# Patient Record
Sex: Female | Born: 1955 | Race: Black or African American | Hispanic: No | Marital: Single | Smoking: Never smoker
Health system: Southern US, Community
[De-identification: ages and names within clinical notes are randomized; demographics above are authoritative.]

## PROBLEM LIST (undated history)

## (undated) DIAGNOSIS — D509 Iron deficiency anemia, unspecified: Secondary | ICD-10-CM

## (undated) DIAGNOSIS — Z9289 Personal history of other medical treatment: Secondary | ICD-10-CM

## (undated) DIAGNOSIS — E785 Hyperlipidemia, unspecified: Secondary | ICD-10-CM

## (undated) DIAGNOSIS — E876 Hypokalemia: Secondary | ICD-10-CM

## (undated) DIAGNOSIS — C801 Malignant (primary) neoplasm, unspecified: Secondary | ICD-10-CM

## (undated) DIAGNOSIS — Z8673 Personal history of transient ischemic attack (TIA), and cerebral infarction without residual deficits: Secondary | ICD-10-CM

## (undated) DIAGNOSIS — R531 Weakness: Secondary | ICD-10-CM

## (undated) DIAGNOSIS — R601 Generalized edema: Secondary | ICD-10-CM

## (undated) DIAGNOSIS — E119 Type 2 diabetes mellitus without complications: Secondary | ICD-10-CM

## (undated) DIAGNOSIS — G4700 Insomnia, unspecified: Secondary | ICD-10-CM

## (undated) DIAGNOSIS — C799 Secondary malignant neoplasm of unspecified site: Secondary | ICD-10-CM

## (undated) DIAGNOSIS — I639 Cerebral infarction, unspecified: Secondary | ICD-10-CM

## (undated) DIAGNOSIS — I10 Essential (primary) hypertension: Secondary | ICD-10-CM

## (undated) HISTORY — DX: Insomnia, unspecified: G47.00

## (undated) HISTORY — DX: Malignant (primary) neoplasm, unspecified: C80.1

## (undated) HISTORY — DX: Iron deficiency anemia, unspecified: D50.9

## (undated) HISTORY — DX: Personal history of transient ischemic attack (TIA), and cerebral infarction without residual deficits: Z86.73

## (undated) HISTORY — DX: Weakness: R53.1

---

## 2012-06-07 ENCOUNTER — Inpatient Hospital Stay: Payer: Self-pay | Admitting: Internal Medicine

## 2012-06-07 DIAGNOSIS — I369 Nonrheumatic tricuspid valve disorder, unspecified: Secondary | ICD-10-CM

## 2012-06-07 LAB — COMPREHENSIVE METABOLIC PANEL
Albumin: 3.7 g/dL (ref 3.4–5.0)
Bilirubin,Total: 0.4 mg/dL (ref 0.2–1.0)
Calcium, Total: 9.1 mg/dL (ref 8.5–10.1)
Chloride: 104 mmol/L (ref 98–107)
Co2: 25 mmol/L (ref 21–32)
Creatinine: 0.86 mg/dL (ref 0.60–1.30)
EGFR (Non-African Amer.): >60
Glucose: 94 mg/dL (ref 65–99)

## 2012-06-07 LAB — PROTIME-INR
INR: 0.9
Prothrombin Time: 12.6 secs (ref 11.5–14.7)

## 2012-06-07 LAB — CBC
HGB: 13.2 g/dL (ref 12.0–16.0)
MCH: 23.2 pg — ABNORMAL LOW (ref 26.0–34.0)
MCHC: 30.9 g/dL — ABNORMAL LOW (ref 32.0–36.0)
MCV: 75 fL — ABNORMAL LOW (ref 80–100)
WBC: 6.3 10*3/uL (ref 3.6–11.0)

## 2012-06-07 LAB — URINALYSIS, COMPLETE
Bilirubin,UR: NEGATIVE
Leukocyte Esterase: NEGATIVE
Nitrite: NEGATIVE
Protein: 100
Squamous Epithelial: 4

## 2012-06-07 LAB — LIPID PANEL
Cholesterol: 194 mg/dL (ref 0–200)
HDL Cholesterol: 87 mg/dL — ABNORMAL HIGH (ref 40–60)
Triglycerides: 67 mg/dL (ref 0–200)

## 2012-06-07 LAB — HEMOGLOBIN A1C: Hemoglobin A1C: 5.1 % (ref 4.2–6.3)

## 2012-06-08 ENCOUNTER — Ambulatory Visit: Payer: Self-pay | Admitting: Neurology

## 2012-06-08 LAB — BASIC METABOLIC PANEL
Anion Gap: 9 (ref 7–16)
Creatinine: 0.92 mg/dL (ref 0.60–1.30)
EGFR (African American): >60
EGFR (Non-African Amer.): >60
Sodium: 140 mmol/L (ref 136–145)

## 2012-06-08 LAB — CBC WITH DIFFERENTIAL/PLATELET
Basophil %: 0.4 %
Eosinophil #: 0.1 10*3/uL (ref 0.0–0.7)
Eosinophil %: 1.1 %
Lymphocyte #: 1 10*3/uL (ref 1.0–3.6)
Lymphocyte %: 18 %
MCH: 23.4 pg — ABNORMAL LOW (ref 26.0–34.0)
MCV: 75 fL — ABNORMAL LOW (ref 80–100)
Monocyte #: 0.7 x10 3/mm (ref 0.2–0.9)
Monocyte %: 11.5 %
Platelet: 237 10*3/uL (ref 150–440)
RBC: 5.34 10*6/uL — ABNORMAL HIGH (ref 3.80–5.20)
RDW: 16.7 % — ABNORMAL HIGH (ref 11.5–14.5)
WBC: 5.7 10*3/uL (ref 3.6–11.0)

## 2012-06-08 LAB — MAGNESIUM: Magnesium: 2 mg/dL

## 2012-06-10 LAB — BASIC METABOLIC PANEL
Anion Gap: 8 (ref 7–16)
BUN: 11 mg/dL (ref 7–18)
Calcium, Total: 9.3 mg/dL (ref 8.5–10.1)
Chloride: 106 mmol/L (ref 98–107)
Co2: 26 mmol/L (ref 21–32)
EGFR (African American): >60
Glucose: 85 mg/dL (ref 65–99)
Potassium: 3.6 mmol/L (ref 3.5–5.1)
Sodium: 140 mmol/L (ref 136–145)

## 2014-06-03 ENCOUNTER — Emergency Department: Payer: Self-pay | Admitting: Emergency Medicine

## 2014-06-03 LAB — BASIC METABOLIC PANEL
ANION GAP: 9 (ref 7–16)
BUN: 24 mg/dL — ABNORMAL HIGH (ref 7–18)
CALCIUM: 8.7 mg/dL (ref 8.5–10.1)
CO2: 28 mmol/L (ref 21–32)
Chloride: 105 mmol/L (ref 98–107)
Creatinine: 1.87 mg/dL — ABNORMAL HIGH (ref 0.60–1.30)
EGFR (African American): 34 — ABNORMAL LOW
EGFR (Non-African Amer.): 29 — ABNORMAL LOW
Glucose: 85 mg/dL (ref 65–99)
Osmolality: 286 (ref 275–301)
Potassium: 3.7 mmol/L (ref 3.5–5.1)
Sodium: 142 mmol/L (ref 136–145)

## 2014-06-03 LAB — CBC
HCT: 37 % (ref 35.0–47.0)
HGB: 11.7 g/dL — AB (ref 12.0–16.0)
MCH: 23.4 pg — ABNORMAL LOW (ref 26.0–34.0)
MCHC: 31.6 g/dL — AB (ref 32.0–36.0)
MCV: 74 fL — AB (ref 80–100)
Platelet: 219 10*3/uL (ref 150–440)
RBC: 4.98 10*6/uL (ref 3.80–5.20)
RDW: 14.6 % — ABNORMAL HIGH (ref 11.5–14.5)
WBC: 6.2 10*3/uL (ref 3.6–11.0)

## 2014-06-03 LAB — TROPONIN I
Troponin-I: 0.03 ng/mL
Troponin-I: 0.04 ng/mL

## 2015-04-11 NOTE — Consult Note (Signed)
Referring Physician:  Gladstone Lighter :   Reason for Consult:  Admit Date: 07-Jun-2012   Chief Complaint: left-sided weakness   Reason for Consult: CVA   History of Present Illness:  History of Present Illness:   Renee Wells is seen in consultation at the request of Dr. Tressia Miners for evaluation of stroke. Renee Wells is a 59 year-old right-handed female without significant past medical history (does not regularly follow-up with a PCP) who was hospitalized at [facility] on 06/07/2012 for stroke. She reports that her symptoms began on 06/04/2012, when she experienced a transient burning sensation along the right side of her face for a few minutes, which then resolved spontaneously. On 06/05/2012, she experienced acute-onset weakness of her left leg, stating that her left leg suddenly "gave out," causing her to fall. When she got back up, her left leg weakness had resolved, and the patient attributed her fall to a misstep. On 06/06/2012, the patient reports that she was feeling "funny" throughout the whole day and experienced transient burning sensation on the right side of her face and weakness of her left arm and left leg. After taking a nap and waking up later that evening, she felt better, as the facial nurning sensation and left-sided weakness had completely resolved. However, after the patient woke up on 06/07/2012, she noticed that her left arm and left leg weakness had returned. She was able to lift her left arm and left leg against gravity, but otherwise had significant difficulty with walking and with grasping objects. She denies any sensory deficits. No visual complaints or facial droop. She does feel that her speech seems a little bit slurred. Due to concern for stroke, she presented to the [facility] ED. Her BP in the ED was noted to be elevated at 227/113. This morning, the patient complains of muscle spasms on her left side. She also feels weaker this morning on her left  side, as she can barely raise her left arm and left leg against gravity.  ROS:   General weight gain of 11 lbs over past 1.5 years    HEENT no complaints    Lungs no complaints    Cardiac no complaints    GI no complaints    GU no complaints    Musculoskeletal muscle spasms on left side    Extremities no complaints    Skin no complaints    Neuro had headache yesterday, resolved this morning    Endocrine no complaints    Psych has been under some stress due to the poor health of her brother   Past Medical/Surgical Hx:  denies:   Past Medical/ Surgical Hx:   Past Medical History None    Past Surgical History C-section   Home Medications:  Additional Home Medications: takes a natural vitamin, but no other medications   [facility] Current Meds:  Sodium Chloride 0.9%, 1000 ml at 60 ml/hr  Acetaminophen * tablet, ( Tylenol (325 mg) tablet)  650 mg Oral q4h PRN for pain or temp. greater than 100.4  - Indication: Pain/Fever  Enoxaparin injection, ( Lovenox injection )  40 mg, Subcutaneous, daily  Indication: Prophylaxis or treatment of thromboembolic disorders, Monitor Anticoags per hospital protocol  Ondansetron injection, ( Zofran injection )  4 mg, IV push, q4h PRN for Nausea/Vomiting  Indication: Nausea/ Vomiting  Pantoprazole tablet, 40 mg Oral q6am  - Indication: Erosive Esophagitis/ GERD  Instructions:  DO NOT CRUSH  Aspirin Enteric Coated tablet, ( Ecotrin)  325 mg  Oral daily  - Indication: Pain/Fever/Thromboembolic Disorders/Post MI/Prophylaxis MI  Instructions:  Initiate Bleeding Precautions Protocol--DO NOT CRUSH  Metoprolol tartrate tablet, ( Lopressor)  25 mg Oral bid  - Indication: Antihypertensive/ Angina  hydrALAZINE HCl injection, ( Apresoline injection )  10 mg, IV push, q4h PRN for HTN  Indication: Hypertension/ CHF, for SBP> 165  Simvastatin tablet, ( Zocor)  20 mg Oral at bedtime  - Indication: Hypercholesterolemia  hydrALAZINE HCl tablet,  ( Apresoline)  25 mg Oral tid  - Indication: Hypertension/ CHF  cloNIDine  tablet, ( Catapres)  0.2 mg Oral bid  - Indication: Hypertension/ Withdrawal Symptoms  hydrochlorothiazide-lisinopril 12.5/10 mg tablet,  ( Lisinopril 10-HCTZ 12.20m tablet)  1 tablet(s) Oral daily  - Indication: Hypertension/ CHF  Cyclobenzaprine tablet,  ( Flexeril)  10 mg Oral q8h PRN for myscle spasm  - Indication: Muscle Relaxant/ Muscle Spasm  Allergies:  Penicillin: Hives  Social/Family History:  Social History: Lives with her daughter and grandchildren. Currently unemployed. Can perform all ADLs at baseline. Denies tobacco use. Occasionally drinks beer on weekends. Denies illicit drug use.   Family History: Uncle had a stroke. Mother had enlarged heart and hypertension.   Vital Signs: **Vital Signs.:   22-Jun-13 00:10   Vital Signs Type Recheck   Pulse Pulse 76   Systolic BP Systolic BP 1325  Diastolic BP (mmHg) Diastolic BP (mmHg) 84   Mean BP 105    04:22   Vital Signs Type Routine   Temperature Temperature (F) 98.4   Celsius 36.8   Temperature Source Oral   Pulse Pulse 83   Respirations Respirations 16   Systolic BP Systolic BP 1498  Diastolic BP (mmHg) Diastolic BP (mmHg) 99   Mean BP 124   Pulse Ox % Pulse Ox % 99   Pulse Ox Activity Level  At rest   Oxygen Delivery Room Air/ 21 %    05:23   Vital Signs Type Recheck   Systolic BP Systolic BP 1264  Diastolic BP (mmHg) Diastolic BP (mmHg) 95   Mean BP 120   Physical Exam:  General: No acute distress   HEENT: Moist mucous membranes   Neck: Supple, no carotid bruits   Chest: Clear to auscultation bilaterally   Cardiac: RRR, no murmurs/rubs/gallops   Extremities: No edema   Neurologic Exam:  Mental Status: Awake and alert, oriented to self, place, and time. Attention and concentration intact. Speech is fluent, without dysarthria or aphasia. Naming and repetition intact.   Cranial Nerves: Visual fields intact. PERRL. Extra-ocular  movements intact. Facial sensation intact to light touch and pinprick. Face appears grossly symmetrical, though may have minimally delayed activation of left lower face on smile. Palate elevates symmetrically. Full shoulder shrug bilaterally. Tongue protrudes midline.   Motor Exam: Decreased motor tone in left arm and left leg. No pronator drift in right arm, unable to raise left arm up to test pronator drift.  Left upper extremity: 3/5 deltoid, 2/5 tricep, 2/5 bicep, 3/5 wrist extensor, 3/5 wrist flexor, 1/5 hand grip, 0/5 interossei  Right upper extremity: 5/5 deltoid, 5/5 tricep, 5/5 bicep, 5/5 wrist extensor, 5/5 wrist flexor, 5/5 hand grip, 5/5 interossei  Left lower extremity: 3/5 hip flexion, 0/5 knee flexion, 2/5 knee extension, 3/5 foot dorsiflexion, 3/5 foot plantarflexion  Right lower extremity: 5/5 hip flexion, 5/5 knee flexion, 5/5 knee extension, 5/5 foot dorsiflexion, 5/5 foot plantarflexion   Deep Tendon Reflexes: 1+ in bilateral biceps, brachioradialis; 1+ on right patella, 2+ on left patella,  trace achilles. Toes downgoing on right, equivocal on left.   Sensory Exam: Intact to light touch and pinprick in all exteemities. No extinction to double stimulation.   Coordination: Intact finger-to-nose and heel-to-shin on right, unable to test on left due to weakness.   Cerebellar Exam: No truncal ataxia.   Gait: Unable to assess due to left lower extremity weakness   Lab Results: Hepatic:  21-Jun-13 07:58    Bilirubin, Total 0.4   Alkaline Phosphatase 87   SGPT (ALT) 21 (12-78 NOTE: NEW REFERENCE RANGE 11/10/2011)   SGOT (AST) 31   Total Protein, Serum 8.1   Albumin, Serum 3.7  Routine Chem:  21-Jun-13 07:50    Hemoglobin A1c ([facility]) 5.1 (The American Diabetes Association recommends that a primary goal of therapy should be <7% and that physicians should reevaluate the treatment regimen in patients with HbA1c values consistently >8%.)   Cholesterol, Serum 194   Triglycerides,  Serum 67   HDL (INHOUSE)  87   VLDL Cholesterol Calculated 13   LDL Cholesterol Calculated 94 (Result(s) reported on 07 Jun 2012 at 09:30AM.)  22-Jun-13 05:50    Glucose, Serum  100   BUN 12   Creatinine (comp) 0.92   Sodium, Serum 140   Potassium, Serum  3.4   Chloride, Serum 107   CO2, Serum 24   Calcium (Total), Serum 9.0   Anion Gap 9   Osmolality (calc) 279   eGFR (African American) >60   eGFR (Non-African American) >60 (eGFR values <47m/min/1.73 m2 may be an indication of chronic kidney disease (CKD). Calculated eGFR is useful in patients with stable renal function. The eGFR calculation will not be reliable in acutely ill patients when serum creatinine is changing rapidly. It is not useful in  patients on dialysis. The eGFR calculation may not be applicable to patients at the low and high extremes of body sizes, pregnant women, and vegetarians.)   Magnesium, Serum 2.0 (1.8-2.4 THERAPEUTIC RANGE: 4-7 mg/dL TOXIC: > 10 mg/dL  -----------------------)  Routine UA:  21-Jun-13 08:43    Color (UA) Yellow   Clarity (UA) Hazy   Glucose (UA) Negative   Bilirubin (UA) Negative   Ketones (UA) Trace   Specific Gravity (UA) 1.013   Blood (UA) 1+   pH (UA) 6.0   Protein (UA) 100 mg/dL   Nitrite (UA) Negative   Leukocyte Esterase (UA) Negative (Result(s) reported on 07 Jun 2012 at 09:16AM.)   RBC (UA) 1 /HPF   WBC (UA) 1 /HPF   Bacteria (UA) TRACE   Epithelial Cells (UA) 4 /HPF   Mucous (UA) PRESENT   Amorphous Crystal (UA) PRESENT (Result(s) reported on 07 Jun 2012 at 09:16AM.)  Routine Coag:  21-Jun-13 07:58    Prothrombin 12.6   INR 0.9 (INR reference interval applies to patients on anticoagulant therapy. A single INR therapeutic range for coumarins is not optimal for all indications; however, the suggested range for most indications is 2.0 - 3.0. Exceptions to the INR Reference Range may include: Prosthetic heart valves, acute myocardial infarction, prevention of  myocardial infarction, and combinations of aspirin and anticoagulant. The need for a higher or lower target INR must be assessed individually. Reference: The Pharmacology and Management of the Vitamin K  antagonists: the seventh ACCP Conference on Antithrombotic and Thrombolytic Therapy. CWPYKD.9833Sept:126 (3suppl): [id number] A HCT value >55% may artifactually increase the PT.  In one study,  the increase was an average of 25%. Reference:  "Effect on Routine and Special Coagulation Testing Values  of Citrate Anticoagulant Adjustment in Patients with High HCT Values." American Journal of Clinical Pathology 2006;126:400-405.)   Activated PTT (APTT) 27.7 (A HCT value >55% may artifactually increase the APTT. In one study, the increase was an average of 19%. Reference: "Effect on Routine and Special Coagulation Testing Values of Citrate Anticoagulant Adjustment in Patients with High HCT Values." American Journal of Clinical Pathology 2006;126:400-405.)  Routine Hem:  22-Jun-13 05:50    WBC (CBC) 5.7   RBC (CBC)  5.34   Hemoglobin (CBC) 12.5   Hematocrit (CBC) 40.2   Platelet Count (CBC) 237   MCV  75   MCH  23.4   MCHC  31.2   RDW  16.7   Neutrophil % 69.0   Lymphocyte % 18.0   Monocyte % 11.5   Eosinophil % 1.1   Basophil % 0.4   Neutrophil # 4.0   Lymphocyte # 1.0   Monocyte # 0.7   Eosinophil # 0.1   Basophil # 0.0 (Result(s) reported on 08 Jun 2012 at 06:39AM.)   Radiology Results: [place]:    21-Jun-13 10:18, US Carotid Doppler Bilateral   US Carotid Doppler Bilateral    REASON FOR EXAM:    CAV with left sided weakness  COMMENTS:       PROCEDURE: [place]  - US CAROTID DOPPLER BILATERAL  - Jun 07 2012 10:18AM     RESULT: There is observed very slight intimal thickening bilaterally. No   calcific plaque formation is seen oneither side. On the right, the peak   right common carotid artery flow velocity measures 71 cm/second and the   peak right internal carotid artery flow  velocity measures 82.4 cm/second.   The ICA/CCA ratio is 1.16.     On the left, the peak left common carotid artery flow velocity measures   85.9 cm/second and the peak left internal carotid artery flow velocity   measures 79.8 cm/second. The ICA/CCA ratio is 0.93. These values   bilaterally are in the normal range and are consistent with the bilateral     absence of hemodynamically significant stenosis.    There is observed antegrade flow in both vertebrals.    IMPRESSION:   1. No hemodynamically significant stenosis is identified on either side.  2. There is antegrade flow in both vertebrals.    Thank you for the opportunity to contribute to the care of your patient.     Dictation site: 2           Verified By: Dionne Ano WALL, M.D., MD  MRI:    21-Jun-13 15:34, MRI Brain Without Contrast   MRI Brain Without Contrast    REASON FOR EXAM:    left sided weakness  COMMENTS:       PROCEDURE: MR  - MR BRAIN WO CONTRAST  - Jun 07 2012  3:34PM     RESULT: History: Weakness    Technique: Multiplanar, multisequence MRI of the brain was obtained   without IV contrast.     Comparison:  None    Findings:     There is restricted diffusion in the right pons with corresponding ADC     hypointensity consistent with an acute infarct. There is no hemorrhage.   There is no pathologic extra-axial fluid collection. There is generalized   cerebral atrophy. There is periventricular and deep white matter T2 and   FLAIR hyperintensity likely secondary to microangiopathy. There is no   hydrocephalus. The ventricles are normal for age. The basal cisterns are  patent. There are no abnormal extra-axial fluid collections.     There is bilateral mild maxillary sinus mucosal thickening. The skull   base and calvarium demonstrate normal signal. The major intracranial flow   voids, including dural venous sinuses appear patent.    IMPRESSION:     Acute nonhemorrhagic right pontine  infarct.    Dictation Site: 1          Verified By: Jennette Banker, M.D., MD  CT:    21-Jun-13 08:06, CT Head Without Contrast   CT Head Without Contrast    REASON FOR EXAM:    Stroke-Like Symptoms- Focal Neuro Deficit  COMMENTS:       PROCEDURE: CT  - CT HEAD WITHOUT CONTRAST  - Jun 07 2012  8:06AM     RESULT: History: A stroke.    Findings: Standard nonenhanced CT is obtained. Lucency is noted adjacent  to the caudate nucleus on the right. This patient in anterior limb the   right internal capsule. This could represent a subacute stroke and White   matter changes noted consistent with chronic ischemia. No mass lesion or   hemorrhage.    IMPRESSION:  Lucency noted anteriorly in the right internal capsule most   likely subacute stroke. No hemorrhage. No bony abnormality.    Verified By: Osa Craver, M.D., MD   Impression/Recommendations:  Recommendations:   59 year-old right-handed female without significant past medical history (due to poor follow-up with PCP) now with right pontine stroke. pontine stroke: Patient has large infarct in the right pons on MRI brain (can also be appreciated on CT brain). I personally reviewed the patient's CT and MRI brain imaging. Her weakness in the left upper and left lower extremities does localize to this lesion. In addition, she has a significant amount of periventricular white matter disease, likely secondary to her underlying hypertension. Her stroke is likely due to small vessel disease due to chronic untreated hypertension. score of 6 (3 for left upper extremity weakness, 3 for left lower extremity weakness)with starting patient on aspirin for anti-platelet therapy.with starting patient on simvastatin for statin therapy. for permissive hypertension in context of acute stroke, though would treat BPs > 220/110. Agree that patient will require better blood pressure control as an outpatient.was 94.was 5.1%dopplers did not show any  significant carotid stenosis.on cardiac telemetry to evaluate for atrial fibrillation.was completed earlier today, results are pending.risk factor modificaton with patient at bedside today. Stressed the importance of taking aspirin, statin, and treating her hypertension. She does not smoke.will require evaluation by PT/OT/speech therapy for consideration of rehabilitation needs. Based on her deficits today, she may be a good candidate for acute inpatient rehab. you for this interesting consult.  Electronic Signatures: Rennis Chris (MD)  (Signed 22-Jun-13 09:58)  Authored: REFERRING PHYSICIAN, Consult, History of Present Illness, Review of Systems, PAST MEDICAL/SURGICAL HISTORY, HOME MEDICATIONS, Current Medications, ALLERGIES, Social/Family History, NURSING VITAL SIGNS, Physical Exam-, LAB RESULTS, RADIOLOGY RESULTS, Recommendations   Last Updated: 22-Jun-13 09:58 by Rennis Chris (MD)

## 2015-04-11 NOTE — H&P (Signed)
PATIENT NAME:  Renee Wells, Renee Wells MR#:  [id number] DATE OF BIRTH:  [birth year]  DATE OF ADMISSION:  06/07/2012  ADMITTING PHYSICIAN: Gladstone Lighter, MD  PRIMARY CARE PHYSICIAN: None.  REFERRING PHYSICIAN: Pollie Friar, MD  CHIEF COMPLAINT: Left-sided weakness.   HISTORY OF PRESENT ILLNESS: Renee Wells is a 59 year old obese African American female with no significant past medical history known to her since she has not seen a physician in the past 5 to 6 years who comes with left-sided weakness going on for two days now. The patient says she does not know if she has any history of hypertension or other medical problems since she has not seen a physician lately, but she started feeling tingling of her right side of the face about a week ago, which was intermittent, she did not pay much attention, and then two days ago she felt like her left leg gave out on her. After a while she did notice some weakness but she has been functioning fine so she did not bother, but since yesterday her left arm started to feel weak and this morning both her left arm and leg are weaker so she came to the emergency room. Her blood pressure was elevated at 227/113 here and she does have 3/5 strength on the left side. The patient is a right-handed person. CT of the head shows subacute infarct noted in right basal ganglia corresponding to her deficits.  PAST MEDICAL HISTORY: No known history.   PAST SURGICAL HISTORY: Cesarean section.   ALLERGIES TO MEDICATIONS: Penicillin causes rash and itching.   HOME MEDICATIONS: Multivitamins over-the-counter.   SOCIAL HISTORY: She lives at home with her daughter, not working currently. No history of smoking. Drinks beer occasionally.   FAMILY HISTORY: Both parents are deceased. Mom had hypertension and cardiac disease and Dad lived late and died from blood clots.  REVIEW OF SYSTEMS: CONSTITUTIONAL: No fever, fatigue, or weakness. EYES: No blurred vision, double vision, glaucoma,  or cataracts. Uses reading glasses. ENT: No tinnitus, ear pain, epistaxis, discharge, or hearing loss. LUNGS: No dyspnea, cough, chronic obstructive pulmonary disease, or asthma. CARDIOVASCULAR: No chest pain, orthopnea, edema, arrhythmia, palpitations, or syncope. GI: No nausea, vomiting, diarrhea, abdominal pain, hematemesis, or melena. GENITOURINARY: No dysuria, hematuria, renal calculus, frequency, or incontinence. ENDOCRINE: No polyuria, nocturia, thyroid problems, heat or cold intolerance. HEMATOLOGY: No anemia, easy bruising, or bleeding. SKIN: No acne, rash, or lesions. MUSCULOSKELETAL: No neck, back, shoulder pain, arthritis, or gout. NEUROLOGIC: Positive for left-sided weakness and right facial tingling and numbness. No prior CVA, TIA, or seizures. PSYCHOLOGIC: No anxiety, insomnia, or depression.   PHYSICAL EXAMINATION:   VITAL SIGNS: Temperature 97 degrees Fahrenheit, pulse 95, respirations 18, blood pressure 227/113, and pulse oximetry 100% on room air.   GENERAL: Heavily built, well nourished female lying in bed, not in any acute distress.   HEENT: Normocephalic, atraumatic. Pupils equal, round, and reacting to light. Anicteric sclerae. Extraocular movements intact. Oropharynx clear without erythema, mass, or exudates. No obvious facial droop noted.   NECK: Supple. No thyromegaly, JVD, or carotid bruits. No lymphadenopathy.   LUNGS: Clear to auscultation bilaterally. No wheeze or crackles. No use of accessory muscles for breathing.   HEART: S1 and S2 regular rate and rhythm. No murmurs, rubs, or gallops.   ABDOMEN: Soft, nontender, and nondistended. No hepatosplenomegaly. Normal bowel sounds.   EXTREMITIES: No pedal edema. No clubbing or cyanosis. 2+ dorsalis pedis pulses palpable bilaterally.   SKIN: No acne, rash, or lesions.   NEUROLOGIC:  Cranial nerves appear intact. 5/5 strength on the right side and 3/5 strength of left upper and lower extremities. In both proximal and  distal group, the strength is decreased on the left side. No cerebellar dysfunction signs are seen.   PSYCH: The patient is awake, alert, and oriented x3.  LABS/RADIOLOGIC STUDIES: WBC 6.3, hemoglobin 13.2, hematocrit 42.7, and platelet count 254.   Sodium 138, potassium 3.2, chloride 104, bicarbonate 25, BUN 15, creatinine 0.86, glucose 94, and calcium 9.1.   ALT 21, AST 31, alkaline phosphatase 87, total bilirubin 0.4, albumin 3.7, and calcium 9.1. INR 0.9. Hemoglobin A1c 5.1.   LDL 94, HDL 87, triglycerides 67, total cholesterol 94 and VLDL 13.   Urinalysis is  negative for any infection.   CT of the head is showing lucency noted anteriorly, right internal capsule, most likely subacute stroke. No hemorrhage and no acute bony abnormality present.  EKG is showing normal sinus rhythm.   ASSESSMENT AND PLAN: This is a 59 year old with no known past medical history admitted for left-sided weakness. 1. Acute/subacute cerebrovascular accident with subacute infarct noted in right basal ganglia with left-sided deficits on examination. We will admit to telemetry, get MRI of the brain, echocardiogram, and Dopplers. Likely reason could be from her hypertension since it was undiagnosed and she is not on any medications. She needs blood pressure control. Start her on aspirin and statin at this time. We will get physical therapy consult and also neuro checks.  2. Malignant hypertension. Start with p.o. metoprolol and lisinopril and add IV hydralazine p.r.n. and adjust medications.  3. Hypokalemia. 40 mEq of potassium is being given.     CODE STATUS: FULL CODE.   TIME SPENT ON ADMISSION: 50 minutes.  ____________________________ Gladstone Lighter, MD [id number] D: 06/07/2012 11:50:17 ET T: 06/07/2012 12:31:12 ET JOB#: [id number]  cc: Gladstone Lighter, MD, <Dictator> Gladstone Lighter MD ELECTRONICALLY SIGNED 06/12/2012 15:24

## 2015-04-11 NOTE — Discharge Summary (Signed)
PATIENT NAME:  Renee Wells, Renee Wells MR#:  [id number] DATE OF BIRTH:  05-20-1956  DATE OF ADMISSION:  06/07/2012 DATE OF DISCHARGE:  06/11/2012  ADMITTING PHYSICIAN: Gladstone Lighter, MD  DISCHARGING PHYSICIAN: Gladstone Lighter, MD  PRIMARY CARE PHYSICIAN: None.  CONSULTANTS: Lawana Pai, MD - Neurology.   DISCHARGE DIAGNOSES:  1. 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  2. Malignant hypertension.  3. Constipation.  4. Hypokalemia, being replaced.   DISCHARGE HOME MEDICATIONS: 1. Aspirin 325 mg p.o. daily.  2. Simvastatin 20 mg p.o. at bedtime.  3. Hydralazine 50 mg p.o. three times daily. 4. Clonidine 0.2 mg p.o. twice a day. 5. Metoprolol 25 mg p.o. twice a day. 6. Lisinopril/HCTZ 20/12.5 mg 1 tablet p.o. daily.   DISCHARGE DIET: Low-sodium diet.   DISCHARGE ACTIVITY: As tolerated.   FOLLOWUP INSTRUCTIONS:  1. Primary care physician follow-up in 1 to 2 weeks.  2. Physical therapy.   LABORATORY, DIAGNOSTIC AND RADIOLOGIC DATA: WBC 5.7, hemoglobin 12.5, hematocrit 40.2, and platelet count 237.   Sodium 140, potassium 3.6, chloride 106, bicarbonate 26, BUN 11, creatinine 0.91, glucose 85, and calcium 9.3. Magnesium 2.0.   Urinalysis negative for any infection.   Hemoglobin A1c 5.1.   LDL 94, HDL 87, total cholesterol 194, and triglycerides 67. PTT 27.7.  INR 0.9. ALT 21, AST 31, alkaline phosphatase 87, total bilirubin 0.4, and albumin 3.7.   Initial CT of the head on admission showed right internal capsule lucency, likely subacute stroke. No hemorrhage.   Ultrasound of carotids bilaterally showed no significant hemodynamically significant stenosis.   Echo Doppler showed ejection fraction greater than 75%, normal systolic function, mildly impaired LV relaxation. No clot or thrombus seen. Mildly elevated right-sided pressures.   MRI of the brain without contrast showed acute nonhemorrhagic right pontine infarct.    Prior to discharge, the patient had a repeat CT of the head to see if there is any expansion of the stroke, however, the CT showed no intracranial hemorrhage and findings consistent with acute right pontine infarct are seen.   BRIEF HOSPITAL COURSE: Renee Wells is a 59 year old African American female with no significant past medical history known to her because she has not seen a physician for 5 to 6 years, who is active and healthy at home who was brought in secondary to weakness of the left side of her body. She had symptoms ongoing for two days prior to presentation with the leg giving out and some tingling and numbness on the right side of the face followed by this episode. Her blood pressure was elevated at 227/113 when she initially came in and her strength was 3 out of 5 on initial admission.  1. Acute cerebrovascular accident. The following day of admission, she had dense hemiplegia on the left side with 0/5 strength, on the left upper and also lower extremities. MRI showed acute right pontine infarct without any significant stenosis on the carotid Doppler's. No thrombus was seen on Echo. Likely cause could have been hypertensive disease. She was not taking any blood pressure medication and pressure was persistently elevated in the hospital. Initially blood pressure was not brought down significantly because of the acute infarct to maintain the intracranial pressure. The day after medications were adjusted for better blood pressure control. She was started on aspirin as she was not taking any antiplatelet agents prior to admission. She was also started on low dose statin. Her strength is 3/5 on the left side now and she  has been working with physical therapy and she is very motivated and is using a Hnat at this time. She will be transferred to [facility] inpatient rehab for more physical therapy.  2. Accelerated/malignant hypertension. Currently better controlled with oral hydralazine, clonidine,  metoprolol, and lisinopril/ HCTZ. Medication doses can be adjusted as tolerated.  3. Constipation. Improved with MiraLax while in the hospital. 4. Hypokalemia on admission. Replaced and improved. 5. Muscle spasms on the left side from the hemiplegia. She tolerated 5 mg of Flexeril as needed and currently does not have any spasms.   Her course has been otherwise uneventful in the hospital.   DISCHARGE CONDITION: Stable.   DISCHARGE DISPOSITION: [facility] inpatient acute rehab.   CODE STATUS: FULL CODE.   TIME SPENT ON DISCHARGE: 45 minutes.  ____________________________ Gladstone Lighter, MD rk:slb D: 06/11/2012 10:13:16 ET T: 06/11/2012 10:33:58 ET JOB#: [id number]  cc: Gladstone Lighter, MD, <Dictator> Gladstone Lighter MD ELECTRONICALLY SIGNED 06/12/2012 15:24

## 2015-06-25 ENCOUNTER — Inpatient Hospital Stay
Admission: EM | Admit: 2015-06-25 | Discharge: 2015-06-27 | DRG: 305 | Disposition: A | Discharge status: Home / Self Care | Payer: Medicaid Other | Attending: Internal Medicine | Admitting: Internal Medicine

## 2015-06-25 ENCOUNTER — Encounter: Payer: Self-pay | Admitting: Emergency Medicine

## 2015-06-25 DIAGNOSIS — Z880 Allergy status to penicillin: Secondary | ICD-10-CM

## 2015-06-25 DIAGNOSIS — I10 Essential (primary) hypertension: Principal | ICD-10-CM | POA: Diagnosis present

## 2015-06-25 DIAGNOSIS — T502X5A Adverse effect of carbonic-anhydrase inhibitors, benzothiadiazides and other diuretics, initial encounter: Secondary | ICD-10-CM | POA: Diagnosis present

## 2015-06-25 DIAGNOSIS — E785 Hyperlipidemia, unspecified: Secondary | ICD-10-CM | POA: Diagnosis present

## 2015-06-25 DIAGNOSIS — E876 Hypokalemia: Secondary | ICD-10-CM | POA: Diagnosis present

## 2015-06-25 DIAGNOSIS — Z7982 Long term (current) use of aspirin: Secondary | ICD-10-CM

## 2015-06-25 DIAGNOSIS — Z79899 Other long term (current) drug therapy: Secondary | ICD-10-CM

## 2015-06-25 DIAGNOSIS — R739 Hyperglycemia, unspecified: Secondary | ICD-10-CM

## 2015-06-25 DIAGNOSIS — R0989 Other specified symptoms and signs involving the circulatory and respiratory systems: Secondary | ICD-10-CM

## 2015-06-25 DIAGNOSIS — R600 Localized edema: Secondary | ICD-10-CM | POA: Diagnosis present

## 2015-06-25 DIAGNOSIS — I159 Secondary hypertension, unspecified: Secondary | ICD-10-CM

## 2015-06-25 DIAGNOSIS — Z6841 Body Mass Index (BMI) 40.0 and over, adult: Secondary | ICD-10-CM

## 2015-06-25 DIAGNOSIS — Z8673 Personal history of transient ischemic attack (TIA), and cerebral infarction without residual deficits: Secondary | ICD-10-CM

## 2015-06-25 DIAGNOSIS — I509 Heart failure, unspecified: Secondary | ICD-10-CM | POA: Diagnosis present

## 2015-06-25 DIAGNOSIS — E669 Obesity, unspecified: Secondary | ICD-10-CM

## 2015-06-25 HISTORY — DX: Hyperlipidemia, unspecified: E78.5

## 2015-06-25 HISTORY — DX: Cerebral infarction, unspecified: I63.9

## 2015-06-25 LAB — CBC
HEMATOCRIT: 37.7 % (ref 35.0–47.0)
HEMOGLOBIN: 11.9 g/dL — AB (ref 12.0–16.0)
MCH: 23.5 pg — AB (ref 26.0–34.0)
MCHC: 31.6 g/dL — AB (ref 32.0–36.0)
MCV: 74.4 fL — AB (ref 80.0–100.0)
Platelets: 237 10*3/uL (ref 150–440)
RBC: 5.07 MIL/uL (ref 3.80–5.20)
RDW: 15.9 % — ABNORMAL HIGH (ref 11.5–14.5)
WBC: 10.3 10*3/uL (ref 3.6–11.0)

## 2015-06-25 NOTE — ED Notes (Signed)
Patient reports sent to the ED by primary MD due to low potassium level.

## 2015-06-26 ENCOUNTER — Inpatient Hospital Stay
Admit: 2015-06-26 | Discharge: 2015-06-26 | Disposition: A | Discharge status: Home / Self Care | Payer: Medicaid Other | Attending: Internal Medicine | Admitting: Internal Medicine

## 2015-06-26 ENCOUNTER — Encounter: Payer: Self-pay | Admitting: Internal Medicine

## 2015-06-26 DIAGNOSIS — Z79899 Other long term (current) drug therapy: Secondary | ICD-10-CM | POA: Diagnosis not present

## 2015-06-26 DIAGNOSIS — R739 Hyperglycemia, unspecified: Secondary | ICD-10-CM | POA: Diagnosis present

## 2015-06-26 DIAGNOSIS — Z6841 Body Mass Index (BMI) 40.0 and over, adult: Secondary | ICD-10-CM | POA: Diagnosis not present

## 2015-06-26 DIAGNOSIS — E785 Hyperlipidemia, unspecified: Secondary | ICD-10-CM | POA: Diagnosis present

## 2015-06-26 DIAGNOSIS — I509 Heart failure, unspecified: Secondary | ICD-10-CM | POA: Diagnosis present

## 2015-06-26 DIAGNOSIS — Z8673 Personal history of transient ischemic attack (TIA), and cerebral infarction without residual deficits: Secondary | ICD-10-CM | POA: Diagnosis not present

## 2015-06-26 DIAGNOSIS — I10 Essential (primary) hypertension: Secondary | ICD-10-CM | POA: Diagnosis not present

## 2015-06-26 DIAGNOSIS — Z7982 Long term (current) use of aspirin: Secondary | ICD-10-CM | POA: Diagnosis not present

## 2015-06-26 DIAGNOSIS — R0989 Other specified symptoms and signs involving the circulatory and respiratory systems: Secondary | ICD-10-CM

## 2015-06-26 DIAGNOSIS — E876 Hypokalemia: Secondary | ICD-10-CM | POA: Diagnosis present

## 2015-06-26 DIAGNOSIS — Z880 Allergy status to penicillin: Secondary | ICD-10-CM | POA: Diagnosis not present

## 2015-06-26 DIAGNOSIS — R600 Localized edema: Secondary | ICD-10-CM | POA: Diagnosis present

## 2015-06-26 DIAGNOSIS — T502X5A Adverse effect of carbonic-anhydrase inhibitors, benzothiadiazides and other diuretics, initial encounter: Secondary | ICD-10-CM | POA: Diagnosis present

## 2015-06-26 LAB — BASIC METABOLIC PANEL
Anion gap: 9 (ref 5–15)
Anion gap: 10 (ref 5–15)
BUN: 22 mg/dL — AB (ref 6–20)
BUN: 17 mg/dL (ref 6–20)
CALCIUM: 9.4 mg/dL (ref 8.9–10.3)
CO2: 34 mmol/L — ABNORMAL HIGH (ref 22–32)
CO2: 32 mmol/L (ref 22–32)
CREATININE: 0.91 mg/dL (ref 0.44–1.00)
Calcium: 9.6 mg/dL (ref 8.9–10.3)
Chloride: 100 mmol/L — ABNORMAL LOW (ref 101–111)
Chloride: 102 mmol/L (ref 101–111)
Creatinine, Ser: 1.07 mg/dL — ABNORMAL HIGH (ref 0.44–1.00)
GFR calc Af Amer: >60 mL/min (ref >60)
GFR calc non Af Amer: 55 mL/min — ABNORMAL LOW (ref >60)
GLUCOSE: 109 mg/dL — AB (ref 65–99)
GLUCOSE: 133 mg/dL — AB (ref 65–99)
POTASSIUM: 2.2 mmol/L — AB (ref 3.5–5.1)
Potassium: 2.6 mmol/L — CL (ref 3.5–5.1)
Sodium: 143 mmol/L (ref 135–145)
Sodium: 144 mmol/L (ref 135–145)

## 2015-06-26 LAB — CBC
HCT: 40.3 % (ref 35.0–47.0)
Hemoglobin: 12.8 g/dL (ref 12.0–16.0)
MCH: 23.6 pg — ABNORMAL LOW (ref 26.0–34.0)
MCHC: 31.7 g/dL — ABNORMAL LOW (ref 32.0–36.0)
MCV: 74.5 fL — AB (ref 80.0–100.0)
Platelets: 234 10*3/uL (ref 150–440)
RBC: 5.41 MIL/uL — ABNORMAL HIGH (ref 3.80–5.20)
RDW: 15.9 % — ABNORMAL HIGH (ref 11.5–14.5)
WBC: 11 10*3/uL (ref 3.6–11.0)

## 2015-06-26 LAB — POTASSIUM: POTASSIUM: 2.8 mmol/L — AB (ref 3.5–5.1)

## 2015-06-26 LAB — GLUCOSE, CAPILLARY: GLUCOSE-CAPILLARY: 124 mg/dL — AB (ref 65–99)

## 2015-06-26 LAB — HEMOGLOBIN A1C: HEMOGLOBIN A1C: 5.8 % (ref 4.0–6.0)

## 2015-06-26 LAB — MAGNESIUM: MAGNESIUM: 2.1 mg/dL (ref 1.7–2.4)

## 2015-06-26 LAB — MRSA PCR SCREENING: MRSA by PCR: NEGATIVE

## 2015-06-26 LAB — TROPONIN I: Troponin I: 0.03 ng/mL (ref <0.031)

## 2015-06-26 LAB — BRAIN NATRIURETIC PEPTIDE: B Natriuretic Peptide: 53 pg/mL (ref 0.0–100.0)

## 2015-06-26 LAB — CK: Total CK: 115 U/L (ref 38–234)

## 2015-06-26 MED ORDER — ASPIRIN EC 325 MG PO TBEC
325.0000 mg | DELAYED_RELEASE_TABLET | Freq: Every day | ORAL | Status: DC
  Administered 2015-06-26 – 2015-06-27 (×2): 325 mg via ORAL
  Filled 2015-06-26 (×2): qty 1

## 2015-06-26 MED ORDER — LISINOPRIL 20 MG PO TABS
40.0000 mg | ORAL_TABLET | Freq: Every day | ORAL | Status: DC
  Administered 2015-06-26: 40 mg via ORAL
  Filled 2015-06-26: qty 2

## 2015-06-26 MED ORDER — ACETAMINOPHEN 325 MG PO TABS
650.0000 mg | ORAL_TABLET | Freq: Once | ORAL | Status: AC
  Filled 2015-06-26: qty 2

## 2015-06-26 MED ORDER — LABETALOL HCL 100 MG PO TABS
100.0000 mg | ORAL_TABLET | Freq: Two times a day (BID) | ORAL | Status: DC
  Administered 2015-06-26 (×2): 100 mg via ORAL
  Filled 2015-06-26 (×2): qty 1

## 2015-06-26 MED ORDER — METOPROLOL SUCCINATE ER 25 MG PO TB24
25.0000 mg | ORAL_TABLET | Freq: Every day | ORAL | Status: DC
  Administered 2015-06-26: 25 mg via ORAL
  Filled 2015-06-26: qty 1

## 2015-06-26 MED ORDER — FUROSEMIDE 20 MG PO TABS
20.0000 mg | ORAL_TABLET | Freq: Once | ORAL | Status: AC
  Administered 2015-06-26: 20 mg via ORAL
  Filled 2015-06-26: qty 1

## 2015-06-26 MED ORDER — SIMVASTATIN 20 MG PO TABS
20.0000 mg | ORAL_TABLET | Freq: Every day | ORAL | Status: DC
  Administered 2015-06-26 – 2015-06-27 (×2): 20 mg via ORAL
  Filled 2015-06-26 (×2): qty 1

## 2015-06-26 MED ORDER — ACETAMINOPHEN 325 MG PO TABS
650.0000 mg | ORAL_TABLET | Freq: Four times a day (QID) | ORAL | Status: DC | PRN
Start: 2015-06-26 — End: 2015-06-27
  Administered 2015-06-26: 650 mg via ORAL

## 2015-06-26 MED ORDER — LISINOPRIL 20 MG PO TABS
40.0000 mg | ORAL_TABLET | Freq: Two times a day (BID) | ORAL | Status: DC
  Administered 2015-06-26 – 2015-06-27 (×2): 40 mg via ORAL
  Filled 2015-06-26 (×2): qty 2

## 2015-06-26 MED ORDER — ENOXAPARIN SODIUM 40 MG/0.4ML ~~LOC~~ SOLN
40.0000 mg | Freq: Two times a day (BID) | SUBCUTANEOUS | Status: DC
  Administered 2015-06-26 – 2015-06-27 (×3): 40 mg via SUBCUTANEOUS
  Filled 2015-06-26 (×3): qty 0.4

## 2015-06-26 MED ORDER — SODIUM CHLORIDE 0.9 % IV SOLN
Freq: Once | INTRAVENOUS | Status: AC
  Administered 2015-06-26: 02:00:00 via INTRAVENOUS
  Filled 2015-06-26: qty 20

## 2015-06-26 MED ORDER — POTASSIUM CHLORIDE CRYS ER 20 MEQ PO TBCR
40.0000 meq | EXTENDED_RELEASE_TABLET | Freq: Once | ORAL | Status: AC
  Administered 2015-06-26: 40 meq via ORAL

## 2015-06-26 MED ORDER — HYDRALAZINE HCL 20 MG/ML IJ SOLN
INTRAMUSCULAR | Status: AC
  Administered 2015-06-26: 10 mg via INTRAVENOUS
  Filled 2015-06-26: qty 1

## 2015-06-26 MED ORDER — NICARDIPINE HCL IN NACL 20-0.86 MG/200ML-% IV SOLN
3.0000 mg/h | INTRAVENOUS | Status: DC
Start: 2015-06-26 — End: 2015-06-27
  Administered 2015-06-26: 15 mg/h via INTRAVENOUS
  Administered 2015-06-26: 12 mg/h via INTRAVENOUS
  Administered 2015-06-26 (×2): 15 mg/h via INTRAVENOUS
  Administered 2015-06-26: 5 mg/h via INTRAVENOUS
  Administered 2015-06-26: 13 mg/h via INTRAVENOUS
  Filled 2015-06-26 (×8): qty 200

## 2015-06-26 MED ORDER — LABETALOL HCL 5 MG/ML IV SOLN: 10.0000 mg | INTRAVENOUS | Status: DC | PRN

## 2015-06-26 MED ORDER — POTASSIUM CHLORIDE 10 MEQ/100ML IV SOLN
10.0000 meq | INTRAVENOUS | Status: AC
  Administered 2015-06-26 (×4): 10 meq via INTRAVENOUS
  Filled 2015-06-26 (×4): qty 100

## 2015-06-26 MED ORDER — POTASSIUM CHLORIDE CRYS ER 20 MEQ PO TBCR
40.0000 meq | EXTENDED_RELEASE_TABLET | ORAL | Status: AC
  Administered 2015-06-26 – 2015-06-27 (×2): 40 meq via ORAL
  Filled 2015-06-26 (×2): qty 2

## 2015-06-26 MED ORDER — CLONIDINE HCL 0.1 MG PO TABS
0.2000 mg | ORAL_TABLET | Freq: Two times a day (BID) | ORAL | Status: DC
  Administered 2015-06-26 – 2015-06-27 (×3): 0.2 mg via ORAL
  Filled 2015-06-26 (×3): qty 2

## 2015-06-26 MED ORDER — ACETAMINOPHEN 650 MG RE SUPP: 650.0000 mg | Freq: Four times a day (QID) | RECTAL | Status: DC | PRN

## 2015-06-26 MED ORDER — HYDRALAZINE HCL 20 MG/ML IJ SOLN
10.0000 mg | Freq: Once | INTRAMUSCULAR | Status: AC
  Administered 2015-06-26: 10 mg via INTRAVENOUS

## 2015-06-26 MED ORDER — POTASSIUM CHLORIDE CRYS ER 20 MEQ PO TBCR
EXTENDED_RELEASE_TABLET | ORAL | Status: AC
  Filled 2015-06-26: qty 1

## 2015-06-26 MED ORDER — HYDRALAZINE HCL 20 MG/ML IJ SOLN: 20.0000 mg | INTRAMUSCULAR | Status: DC | PRN

## 2015-06-26 MED ORDER — LABETALOL HCL 5 MG/ML IV SOLN
10.0000 mg | INTRAVENOUS | Status: DC | PRN
  Administered 2015-06-26: 10 mg via INTRAVENOUS
  Filled 2015-06-26 (×2): qty 4

## 2015-06-26 MED ORDER — HYDRALAZINE HCL 20 MG/ML IJ SOLN: 10.0000 mg | INTRAMUSCULAR | Status: DC | PRN

## 2015-06-26 MED ORDER — SODIUM CHLORIDE 0.9 % IJ SOLN
3.0000 mL | Freq: Two times a day (BID) | INTRAMUSCULAR | Status: DC
  Administered 2015-06-26 – 2015-06-27 (×3): 3 mL via INTRAVENOUS

## 2015-06-26 MED ORDER — HYDRALAZINE HCL 50 MG PO TABS
50.0000 mg | ORAL_TABLET | Freq: Three times a day (TID) | ORAL | Status: DC
  Administered 2015-06-26 – 2015-06-27 (×5): 50 mg via ORAL
  Filled 2015-06-26 (×5): qty 1

## 2015-06-26 MED ORDER — POTASSIUM CHLORIDE CRYS ER 20 MEQ PO TBCR
EXTENDED_RELEASE_TABLET | ORAL | Status: AC
  Administered 2015-06-26: 40 meq via ORAL
  Filled 2015-06-26: qty 1

## 2015-06-26 MED ORDER — TRAZODONE HCL 50 MG PO TABS: 50.0000 mg | ORAL_TABLET | Freq: Every day | ORAL | Status: DC

## 2015-06-26 MED ORDER — METOPROLOL TARTRATE 1 MG/ML IV SOLN
INTRAVENOUS | Status: AC
  Filled 2015-06-26: qty 10

## 2015-06-26 MED ORDER — NICARDIPINE HCL IN NACL 20-0.86 MG/200ML-% IV SOLN
INTRAVENOUS | Status: AC
  Administered 2015-06-26: 5 mg/h via INTRAVENOUS
  Filled 2015-06-26: qty 200

## 2015-06-26 NOTE — Outcomes Assessment (Signed)
Good day. B/P coming down on Cardene Drip. Left sided weakness from previous stroke still present but mild in nature. Attitude is great and willing to cooperate. Making progress with ambulation in room.Voiding without issues.

## 2015-06-26 NOTE — ED Provider Notes (Signed)
[facility] Emergency Department Provider Note  ____________________________________________  Time seen: Approximately 12:45 AM  I have reviewed the triage vital signs and the nursing notes.   HISTORY  Chief Complaint Abnormal Lab    HPI Renee Wells is a 59 y.o. female who presents to the ED from home for "low potassium level". Patient had a scheduled checkup with her PCP earlier today in which she had lab work drawn; she  was called by her PCPthis evening and told to go to the emergency department due to low potassium. Patient's blood pressure was elevated at today's visit; her doctor increased her lisinopril to 40 mg daily and her metoprolol to 25 mg daily. Patient complains of occasional headache and right chest pain. Denies shortness of breath, abdominal pain, vomiting, diarrhea.   Past Medical History  Diagnosis Date  . Stroke   . Hypertension     There are no active problems to display for this patient.   Past Surgical History  Procedure Laterality Date  . Cesarean section      Current Outpatient Rx  Name  Route  Sig  Dispense  Refill  . aspirin 325 MG EC tablet   Oral   Take 325 mg by mouth daily.         . cloNIDine (CATAPRES) 0.2 MG tablet   Oral   Take 0.2 mg by mouth 2 (two) times daily.         . hydrALAZINE (APRESOLINE) 50 MG tablet   Oral   Take 50 mg by mouth 3 (three) times daily.         . hydrochlorothiazide (HYDRODIURIL) 25 MG tablet   Oral   Take 25 mg by mouth daily.         [name] lisinopril (PRINIVIL,ZESTRIL) 40 MG tablet   Oral   Take 40 mg by mouth daily.         . metoprolol succinate (TOPROL-XL) 25 MG 24 hr tablet   Oral   Take 25 mg by mouth daily.         . Multiple Vitamins-Minerals (MULTIVITAMIN WITH MINERALS) tablet   Oral   Take 1 tablet by mouth daily.         . simvastatin (ZOCOR) 20 MG tablet   Oral   Take 20 mg by mouth daily.         . traZODone (DESYREL) 50 MG tablet  Oral   Take 50 mg by mouth at bedtime.           Allergies Penicillins  No family history on file.  Social History History  Substance Use Topics  . Smoking status: Never Smoker   . Smokeless tobacco: Not on file  . Alcohol Use: Yes     Comment: occasional    Review of Systems Constitutional: No fever/chills Eyes: No visual changes. ENT: No sore throat. Cardiovascular: Positive for occasional chest pain. Respiratory: Denies shortness of breath. Gastrointestinal: No abdominal pain.  No nausea, no vomiting.  No diarrhea.  No constipation. Genitourinary: Negative for dysuria. Musculoskeletal: Negative for back pain. Skin: Negative for rash. Neurological: Positive for occasional headaches. Negative for focal weakness or numbness.  10-point ROS otherwise negative.  ____________________________________________   PHYSICAL EXAM:  VITAL SIGNS: ED Triage Vitals  Enc Vitals Group     BP 06/25/15 2307 222/115 mmHg     Pulse Rate 06/25/15 2307 95     Resp 06/25/15 2307 20     Temp 06/25/15 2307 98.3 F (36.8  C)     Temp Source 06/25/15 2307 Oral     SpO2 06/25/15 2307 93 %     Weight 06/25/15 2307 249 lb (112.946 kg)     Height 06/25/15 2307 '5\' 5"'$  (1.651 m)     Head Cir --      Peak Flow --      Pain Score 06/25/15 2309 6     Pain Loc --      Pain Edu? --      Excl. in [place]? --     Constitutional: Alert and oriented. Well appearing and in no acute distress. Eyes: Conjunctivae are normal. PERRL. EOMI. Head: Atraumatic. Nose: No congestion/rhinnorhea. Mouth/Throat: Mucous membranes are moist.  Oropharynx non-erythematous. Neck: No stridor.   Cardiovascular: Normal rate, regular rhythm. Grossly normal heart sounds.  Good peripheral circulation. Respiratory: Normal respiratory effort.  No retractions. Lungs CTAB. Gastrointestinal: Soft and nontender. No distention. No abdominal bruits. No CVA tenderness. Musculoskeletal: No lower extremity tenderness. 1+ pitting edema  BLE.  No joint effusions. Neurologic:  Normal speech and language. No gross focal neurologic deficits are appreciated. Speech is normal. No gait instability. Skin:  Skin is warm, dry and intact. No rash noted. Psychiatric: Mood and affect are normal. Speech and behavior are normal.  ____________________________________________   LABS (all labs ordered are listed, but only abnormal results are displayed)  Labs Reviewed  BASIC METABOLIC PANEL - Abnormal; Notable for the following:    Potassium 2.2 (*)    Chloride 100 (*)    CO2 34 (*)    Glucose, Bld 109 (*)    BUN 22 (*)    Creatinine, Ser 1.07 (*)    GFR calc non Af Amer 55 (*)    All other components within normal limits  CBC - Abnormal; Notable for the following:    Hemoglobin 11.9 (*)    MCV 74.4 (*)    MCH 23.5 (*)    MCHC 31.6 (*)    RDW 15.9 (*)    All other components within normal limits  CK  TROPONIN I   ____________________________________________  EKG  ED ECG REPORT I, SUNG,JADE J, the attending physician, personally viewed and interpreted this ECG.   Date: 06/26/2015  EKG Time: 2321  Rate: 90  Rhythm: normal EKG, normal sinus rhythm  Axis: normal  Intervals:none  ST&T Change: nonspecific  ____________________________________________  RADIOLOGY  None ____________________________________________   PROCEDURES  Procedure(s) performed: None  Critical Care performed: No  ____________________________________________   INITIAL IMPRESSION / ASSESSMENT AND PLAN / ED COURSE  Pertinent labs & imaging results that were available during my care of the patient were reviewed by me and considered in my medical decision making (see chart for details).  59 year old female with hypokalemia and accelerated hypertension. Will initiate both PO and IV potassium, IV hydralazine for blood pressure control. Will discuss with hospitalist for admission. ____________________________________________   FINAL  CLINICAL IMPRESSION(S) / ED DIAGNOSES  Final diagnoses:  Hypokalemia  Secondary hypertension, unspecified      Paulette Blanch, MD 06/26/15 [id number]

## 2015-06-26 NOTE — Progress Notes (Signed)
*  PRELIMINARY RESULTS* Echocardiogram 2D Echocardiogram has been performed.  Renee Wells 06/26/2015, 10:42 AM

## 2015-06-26 NOTE — Progress Notes (Signed)
[place] at [place] NAME: Renee Wells    MR#:  [id number]  DATE OF BIRTH:  05/28/1956  SUBJECTIVE:  CHIEF COMPLAINT:   Chief Complaint  Patient presents with  . Abnormal Lab   patient presented with severe hypokalemia as well as malignant hypertension, now on Nicardipine IV infusion, feels satisfactory today. Has no significant complaints.   Review of Systems  Constitutional: Negative for fever, chills and weight loss.  HENT: Negative for congestion.   Eyes: Negative for blurred vision and double vision.  Respiratory: Negative for cough, sputum production, shortness of breath and wheezing.   Cardiovascular: Negative for chest pain, palpitations, orthopnea, leg swelling and PND.  Gastrointestinal: Negative for nausea, vomiting, abdominal pain, diarrhea, constipation and blood in stool.  Genitourinary: Negative for dysuria, urgency, frequency and hematuria.  Musculoskeletal: Negative for falls.  Neurological: Negative for dizziness, tremors, focal weakness and headaches.  Endo/Heme/Allergies: Does not bruise/bleed easily.  Psychiatric/Behavioral: Negative for depression. The patient does not have insomnia.   All other systems reviewed and are negative.   VITAL SIGNS: Blood pressure 142/79, pulse 100, temperature 98.7 F (37.1 C), temperature source Oral, resp. rate 22, height '5\' 5"'$  (1.651 m), weight 112.946 kg (249 lb), SpO2 94 %.  PHYSICAL EXAMINATION:   GENERAL:  59 y.o.-year-old patient lying in the bed with no acute distress. Morbidly obese.  EYES: Pupils equal, round, reactive to light and accommodation. No scleral icterus. Extraocular muscles intact.  HEENT: Head atraumatic, normocephalic. Oropharynx and nasopharynx clear.  NECK:  Supple, no jugular venous distention. No thyroid enlargement, no tenderness.  LUNGS: Normal breath sounds bilaterally, no wheezing, rales,rhonchi or crepitation. No use of accessory muscles of  respiration.  CARDIOVASCULAR: S1, S2 normal. No murmurs, rubs, or gallops.  ABDOMEN: Soft, nontender, nondistended. Bowel sounds present. No organomegaly or mass.  EXTREMITIES: Trace calve and pedal edema, no cyanosis, or clubbing.  NEUROLOGIC: Cranial nerves II through XII are intact. Muscle strength 5/5 in all extremities. Sensation intact. Gait not checked.  PSYCHIATRIC: The patient is alert and oriented x 3.  SKIN: No obvious rash, lesion, or ulcer.   ORDERS/RESULTS REVIEWED:   CBC  Recent Labs Lab 06/25/15 2327 06/26/15 0614  WBC 10.3 11.0  HGB 11.9* 12.8  HCT 37.7 40.3  PLT 237 234  MCV 74.4* 74.5*  MCH 23.5* 23.6*  MCHC 31.6* 31.7*  RDW 15.9* 15.9*   ------------------------------------------------------------------------------------------------------------------  Chemistries   Recent Labs Lab 06/25/15 2327 06/26/15 0614  NA 143 144  K 2.2* 2.6*  CL 100* 102  CO2 34* 32  GLUCOSE 109* 133*  BUN 22* 17  CREATININE 1.07* 0.91  CALCIUM 9.6 9.4  MG 2.1  --    ------------------------------------------------------------------------------------------------------------------ estimated creatinine clearance is 82.4 mL/min (by C-G formula based on Cr of 0.91). ------------------------------------------------------------------------------------------------------------------ No results for input(s): TSH, T4TOTAL, T3FREE, THYROIDAB in the last 72 hours.  Invalid input(s): FREET3  Cardiac Enzymes  Recent Labs Lab 06/25/15 2327  TROPONINI 0.03   ------------------------------------------------------------------------------------------------------------------ Invalid input(s): POCBNP ---------------------------------------------------------------------------------------------------------------  RADIOLOGY: No results found.  EKG:  Orders placed or performed during the hospital encounter of 06/25/15  . ED EKG  . ED EKG    ASSESSMENT AND PLAN:  Principal  Problem:   Accelerated hypertension Active Problems:   Hypokalemia   HLD (hyperlipidemia)   Suspected CHF (congestive heart failure)   Bilateral lower extremity edema 1. Malignant hypertension, continue nicardipine IV as well as multiple oral medications. Try to wean off the Cardizem  and advance oral medications if needed. Get renin and aldosterone ratio. 2. Hypokalemia,  possibly related to HCTZ, now off a CBC, supplementing orally, magnesium level was normal 3. Lower extremity swelling, possibly related to mild congestive heart failure, getting echocardiogram 4. Hyperglycemia. Get hemoglobin A1c  Management plans discussed with the patient, family and they are in agreement.   DRUG ALLERGIES:  Allergies  Allergen Reactions  . Penicillins Itching    CODE STATUS:     Code Status Orders        Start     Ordered   06/26/15 0541  Full code   Continuous     06/26/15 0540      TOTAL CRITICAL CARE TIME TAKING CARE OF THIS PATIENT: 45  minutes.    Theodoro Grist M.D on 06/26/2015 at 1:06 PM  Between 7am to 6pm - Pager - [phone]  After 6pm go to [email or web address] - password [email or web address] [facility]  Office  [phone]  CC: Primary care physician; Sharyne Peach, MD

## 2015-06-26 NOTE — ED Notes (Signed)
Made admitting MD aware of current blood pressure reading after medication was given to pt by previous RN. MD aware and acknowledged. Received new orders, will complete accordingly. Will continue to monitor.

## 2015-06-26 NOTE — H&P (Addendum)
[place] at [place] NAME: Renee Wells    MR#:  [id number]  DATE OF BIRTH:  03-Oct-1956  DATE OF ADMISSION:  06/26/2015  PRIMARY CARE PHYSICIAN: Sharyne Peach, MD   REQUESTING/REFERRING PHYSICIAN: Beather Arbour  CHIEF COMPLAINT:   Chief Complaint  Patient presents with  . Abnormal Lab    HISTORY OF PRESENT ILLNESS:  Renee Wells  is a 59 y.o. female who presents with profound hypokalemia and accelerated hypertension. Patient states that she was called by her primary care physician with lab results showing a potassium extremely low at 2.6, which on repeat here in the ED was 2.2. She came in the ED and was found to have a blood pressure extremely elevated in the 737T systolic. She states that she's been having headaches recently. She is on a variety of antihypertensives, and states that her PCP just recently increased the dose of a couple of different antihypertensive that she takes. She got some IV antihypertensive medication in the ED, but her blood pressure remained significantly elevated. Patient also complains that she's been having some bilateral lower extremity edema recently. Hospitalists were called for admission for profound hyokalemia and accelerated hypertension.  PAST MEDICAL HISTORY:   Past Medical History  Diagnosis Date  . Stroke   . Hypertension   . HLD (hyperlipidemia)   . Obesity     PAST SURGICAL HISTORY:   Past Surgical History  Procedure Laterality Date  . Cesarean section      SOCIAL HISTORY:   History  Substance Use Topics  . Smoking status: Never Smoker   . Smokeless tobacco: Not on file  . Alcohol Use: Yes     Comment: occasional    FAMILY HISTORY:   Family History  Problem Relation Age of Onset  . Heart disease Mother   . Hypertension Mother   . Diabetes Mother   . Diabetes Sister   . Hyperlipidemia Sister     DRUG ALLERGIES:   Allergies  Allergen Reactions  . Penicillins Itching     MEDICATIONS AT HOME:   Prior to Admission medications   Medication Sig Start Date End Date Taking? Authorizing Provider  aspirin 325 MG EC tablet Take 325 mg by mouth daily.   Yes Historical Provider, MD  cloNIDine (CATAPRES) 0.2 MG tablet Take 0.2 mg by mouth 2 (two) times daily.   Yes Historical Provider, MD  hydrALAZINE (APRESOLINE) 50 MG tablet Take 50 mg by mouth 3 (three) times daily.   Yes Historical Provider, MD  hydrochlorothiazide (HYDRODIURIL) 25 MG tablet Take 25 mg by mouth daily.   Yes Historical Provider, MD  lisinopril (PRINIVIL,ZESTRIL) 40 MG tablet Take 40 mg by mouth daily.   Yes Historical Provider, MD  metoprolol succinate (TOPROL-XL) 25 MG 24 hr tablet Take 25 mg by mouth daily.   Yes Historical Provider, MD  Multiple Vitamins-Minerals (MULTIVITAMIN WITH MINERALS) tablet Take 1 tablet by mouth daily.   Yes Historical Provider, MD  simvastatin (ZOCOR) 20 MG tablet Take 20 mg by mouth daily.   Yes Historical Provider, MD  traZODone (DESYREL) 50 MG tablet Take 50 mg by mouth at bedtime.   Yes Historical Provider, MD    REVIEW OF SYSTEMS:  Review of Systems  Constitutional: Negative for fever, chills, weight loss and malaise/fatigue.  HENT: Negative for ear pain, hearing loss and tinnitus.   Eyes: Negative for blurred vision, double vision, pain and redness.  Respiratory: Negative for cough, hemoptysis and shortness of breath.  Cardiovascular: Positive for leg swelling. Negative for chest pain, palpitations and orthopnea.  Gastrointestinal: Negative for nausea, vomiting, abdominal pain, diarrhea and constipation.  Genitourinary: Negative for dysuria, frequency and hematuria.  Musculoskeletal: Negative for back pain, joint pain and neck pain.  Skin:       No acne, rash, or lesions  Neurological: Positive for headaches. Negative for dizziness, tremors, focal weakness and weakness.  Endo/Heme/Allergies: Negative for polydipsia. Does not bruise/bleed easily.   Psychiatric/Behavioral: Negative for depression. The patient is not nervous/anxious and does not have insomnia.      VITAL SIGNS:   Filed Vitals:   06/26/15 0000 06/26/15 0030 06/26/15 0100 06/26/15 0130  BP: 208/117 200/103 199/100 190/88  Pulse: 85 73 77 79  Temp:      TempSrc:      Resp: '16 17 17 21  '$ Height:      Weight:      SpO2: 94% 94% 92% 92%   Wt Readings from Last 3 Encounters:  06/25/15 112.946 kg (249 lb)    PHYSICAL EXAMINATION:  Physical Exam  Constitutional: She is oriented to person, place, and time. She appears well-developed and well-nourished. No distress.  HENT:  Head: Normocephalic and atraumatic.  Mouth/Throat: Oropharynx is clear and moist.  Eyes: Conjunctivae and EOM are normal. Pupils are equal, round, and reactive to light. No scleral icterus.  Neck: Normal range of motion. Neck supple. No JVD present. No thyromegaly present.  Cardiovascular: Normal rate, regular rhythm and intact distal pulses.  Exam reveals no gallop and no friction rub.   No murmur heard. Respiratory: Effort normal and breath sounds normal. No respiratory distress. She has no wheezes. She has no rales.  GI: Soft. Bowel sounds are normal. She exhibits no distension. There is no tenderness.  Musculoskeletal: Normal range of motion. She exhibits edema (2+ bilateral lower extremity).  No arthritis, no gout  Lymphadenopathy:    She has no cervical adenopathy.  Neurological: She is alert and oriented to person, place, and time. No cranial nerve deficit.  No dysarthria, no aphasia  Skin: Skin is warm and dry. No rash noted. No erythema.  Psychiatric: She has a normal mood and affect. Her behavior is normal. Judgment and thought content normal.    LABORATORY PANEL:   CBC  Recent Labs Lab 06/25/15 2327  WBC 10.3  HGB 11.9*  HCT 37.7  PLT 237   ------------------------------------------------------------------------------------------------------------------  Chemistries    Recent Labs Lab 06/25/15 2327  NA 143  K 2.2*  CL 100*  CO2 34*  GLUCOSE 109*  BUN 22*  CREATININE 1.07*  CALCIUM 9.6   ------------------------------------------------------------------------------------------------------------------  Cardiac Enzymes  Recent Labs Lab 06/25/15 2327  TROPONINI 0.03   ------------------------------------------------------------------------------------------------------------------  RADIOLOGY:  No results found.  EKG:   Orders placed or performed during the hospital encounter of 06/25/15  . ED EKG  . ED EKG    IMPRESSION AND PLAN:  Principal Problem:   Accelerated hypertension - blood pressure not responding to IV antihypertensives given ED. We will continue her home dose antihypertensive medications, and in addition use nicardipine drip to bring her blood pressure down to a goal less than 180/100 within the first 12 hours, to be adjusted to less than 160/90 after that.   Active Problems:   Hypokalemia - likely due to hydrochlorothiazide use, although we will check a magnesium level as well. We will replace her potassium IV and by mouth, and monitor her value for improvement.   Suspected CHF (congestive heart failure) -  given her long-term significantly elevated blood pressure, and her persistent bilateral lower showing edema, we will get an echocardiogram to evaluate for heart failure.   Bilateral lower extremity edema - we'll give a dose of Lasix to help her with her swelling, continue to monitor her potassium closely and replace as necessary.   HLD (hyperlipidemia) - continue home dose medication for this  All the records are reviewed and case discussed with ED provider. Management plans discussed with the patient and/or family.  DVT PROPHYLAXIS: SubQ lovenox  ADMISSION STATUS: Inpatient  CODE STATUS: Full  TOTAL TIME TAKING CARE OF THIS PATIENT: 40 minutes.    Javonte Elenes Glasscock 06/26/2015, 1:53 AM  [facility]  Office  [phone]  CC: Primary care physician; Sharyne Peach, MD

## 2015-06-27 DIAGNOSIS — E669 Obesity, unspecified: Secondary | ICD-10-CM

## 2015-06-27 DIAGNOSIS — R739 Hyperglycemia, unspecified: Secondary | ICD-10-CM

## 2015-06-27 LAB — POTASSIUM: Potassium: 3.2 mmol/L — ABNORMAL LOW (ref 3.5–5.1)

## 2015-06-27 MED ORDER — LABETALOL HCL 200 MG PO TABS: 200.0000 mg | ORAL_TABLET | Freq: Two times a day (BID) | ORAL | Status: DC

## 2015-06-27 MED ORDER — LABETALOL HCL 200 MG PO TABS
200.0000 mg | ORAL_TABLET | Freq: Two times a day (BID) | ORAL | Status: DC
  Administered 2015-06-27: 200 mg via ORAL
  Filled 2015-06-27: qty 1

## 2015-06-27 MED ORDER — LISINOPRIL 40 MG PO TABS: 40.0000 mg | ORAL_TABLET | Freq: Two times a day (BID) | ORAL | Status: DC

## 2015-06-27 NOTE — Discharge Summary (Signed)
[facility] NAME: Renee Wells    MR#:  [id number]  DATE OF BIRTH:  28-Jul-1956  DATE OF ADMISSION:  06/25/2015 ADMITTING PHYSICIAN: Lance Coon, MD  DATE OF DISCHARGE: No discharge date for patient encounter.  PRIMARY CARE PHYSICIAN: Sharyne Peach, MD     ADMISSION DIAGNOSIS:  Hypokalemia [E87.6] Secondary hypertension, unspecified [I15.9]  DISCHARGE DIAGNOSIS:  Principal Problem:   Accelerated hypertension Active Problems:   Hypokalemia   Suspected CHF (congestive heart failure)   Bilateral lower extremity edema   Obesity   HLD (hyperlipidemia)   Hyperglycemia   SECONDARY DIAGNOSIS:   Past Medical History  Diagnosis Date  . Stroke   . Hypertension   . HLD (hyperlipidemia)   . Obesity     .pro HOSPITAL COURSE:   Patient is 59 year old African-American female with history of hypertension who presents to the hospital with complaints of high blood pressure and low potassium level. In emergency room, patient's systolic blood pressure was 220. Her potassium was found to be 2.2. Patient received IV antihypertensives medications. In emergency room, however, since her blood pressure remained highly elevated. She was also concerned about lower extremity edema. She was admitted to the hospital for further evaluation and treatment, requiring IV Cardene infusion. She has medications were advanced and her medications were adjusted. Slowly her Cardene infusion was tapered and stopped. Patient had an echocardiogram while she was in the hospital revealing normal left ventricular ejection fraction, moderate LVH and dilated right ventricular cavity size.  Discussion by problem 1. Malignant hypertension, of nicardipine IV drip, and blood pressure control advanced labetalol and lisinopril doses, awaiting for renin and aldosterone ratio. Patient will be discharged with follow-up with neurologist, Dr. Candiss Norse as outpatient in regards  to evaluation of suspected secondary hypertension. 2. Hypokalemia, suspected related to HCTZ, now off it, but her level today after supplementing orally, magnesium level was normal 3. Lower extremity swelling, possibly related to mild congestive heart failure, echocardiogram showed a right ventricle cavity dilation, questionably related to obstructive sleep apnea. Patient would benefit from sleep study as outpatient 4. Hyperglycemia. Normal hemoglobin A1c. No diabetes mellitus   DISCHARGE CONDITIONS:   Stable  CONSULTS OBTAINED:     DRUG ALLERGIES:   Allergies  Allergen Reactions  . Penicillins Itching    DISCHARGE MEDICATIONS:   Current Discharge Medication List    START taking these medications   Details  labetalol (NORMODYNE) 200 MG tablet Take 1 tablet (200 mg total) by mouth 2 (two) times daily. Qty: 60 tablet, Refills: 3      CONTINUE these medications which have CHANGED   Details  lisinopril (PRINIVIL,ZESTRIL) 40 MG tablet Take 1 tablet (40 mg total) by mouth 2 (two) times daily. Qty: 60 tablet, Refills: 3      CONTINUE these medications which have NOT CHANGED   Details  aspirin 325 MG EC tablet Take 325 mg by mouth daily.    cloNIDine (CATAPRES) 0.2 MG tablet Take 0.2 mg by mouth 2 (two) times daily.    hydrALAZINE (APRESOLINE) 50 MG tablet Take 50 mg by mouth 3 (three) times daily.    Multiple Vitamins-Minerals (MULTIVITAMIN WITH MINERALS) tablet Take 1 tablet by mouth daily.    simvastatin (ZOCOR) 20 MG tablet Take 20 mg by mouth daily.    traZODone (DESYREL) 50 MG tablet Take 50 mg by mouth at bedtime.      STOP taking these medications     hydrochlorothiazide (HYDRODIURIL) 25  MG tablet      metoprolol succinate (TOPROL-XL) 25 MG 24 hr tablet          DISCHARGE INSTRUCTIONS:    Follow-up with nephrologist as well as making physician as outpatient. Patient would benefit from sleep study as outpatient  If you experience worsening of your  admission symptoms, develop shortness of breath, life threatening emergency, suicidal or homicidal thoughts you must seek medical attention immediately by calling 911 or calling your MD immediately  if symptoms less severe.  You Must read complete instructions/literature along with all the possible adverse reactions/side effects for all the Medicines you take and that have been prescribed to you. Take any new Medicines after you have completely understood and accept all the possible adverse reactions/side effects.   Please note  You were cared for by a hospitalist during your hospital stay. If you have any questions about your discharge medications or the care you received while you were in the hospital after you are discharged, you can call the unit and asked to speak with the hospitalist on call if the hospitalist that took care of you is not available. Once you are discharged, your primary care physician will handle any further medical issues. Please note that NO REFILLS for any discharge medications will be authorized once you are discharged, as it is imperative that you return to your primary care physician (or establish a relationship with a primary care physician if you do not have one) for your aftercare needs so that they can reassess your need for medications and monitor your lab values.    Today   CHIEF COMPLAINT:   Chief Complaint  Patient presents with  . Abnormal Lab    HISTORY OF PRESENT ILLNESS:  Renee Wells  is a 59 y.o. female with a known history of hypertension who presents to the hospital with complaints of high blood pressure and low potassium level. In emergency room, patient's systolic blood pressure was 220. Her potassium was found to be 2.2. Patient received IV antihypertensives medications. In emergency room, however, since her blood pressure remained highly elevated. She was also concerned about lower extremity edema. She was admitted to the hospital for further  evaluation and treatment, requiring IV Cardene infusion. She has medications were advanced and her medications were adjusted. Slowly her Cardene infusion was tapered and stopped. Patient had an echocardiogram while she was in the hospital revealing normal left ventricular ejection fraction, moderate LVH and dilated right ventricular cavity size.  Discussion by problem 1. Malignant hypertension, of nicardipine IV drip, and blood pressure control advanced labetalol and lisinopril doses, awaiting for renin and aldosterone ratio. Patient will be discharged with follow-up with neurologist, Dr. Candiss Norse as outpatient in regards to evaluation of suspected secondary hypertension. 2. Hypokalemia, suspected related to HCTZ, now off it, but her level today after supplementing orally, magnesium level was normal 3. Lower extremity swelling, possibly related to mild congestive heart failure, echocardiogram showed a right ventricle cavity dilation, questionably related to obstructive sleep apnea. Patient would benefit from sleep study as outpatient 4. Hyperglycemia. Normal hemoglobin A1c. No diabetes mellitus    VITAL SIGNS:  Blood pressure 150/93, pulse 85, temperature 98 F (36.7 C), temperature source Oral, resp. rate 19, height '5\' 5"'$  (1.651 m), weight 112.946 kg (249 lb), SpO2 98 %.  I/O:   Intake/Output Summary (Last 24 hours) at 06/27/15 1240 Last data filed at 06/27/15 1100  Gross per 24 hour  Intake   2640 ml  Output  2826 ml  Net   -186 ml    PHYSICAL EXAMINATION:  GENERAL:  59 y.o.-year-old patient lying in the bed with no acute distress.  EYES: Pupils equal, round, reactive to light and accommodation. No scleral icterus. Extraocular muscles intact.  HEENT: Head atraumatic, normocephalic. Oropharynx and nasopharynx clear.  NECK:  Supple, no jugular venous distention. No thyroid enlargement, no tenderness.  LUNGS: Normal breath sounds bilaterally, no wheezing, rales,rhonchi or crepitation. No  use of accessory muscles of respiration.  CARDIOVASCULAR: S1, S2 normal. No murmurs, rubs, or gallops.  ABDOMEN: Soft, non-tender, non-distended. Bowel sounds present. No organomegaly or mass.  EXTREMITIES: No pedal edema, cyanosis, or clubbing.  NEUROLOGIC: Cranial nerves II through XII are intact. Muscle strength 5/5 in all extremities. Sensation intact. Gait not checked.  PSYCHIATRIC: The patient is alert and oriented x 3.  SKIN: No obvious rash, lesion, or ulcer.   DATA REVIEW:   CBC  Recent Labs Lab 06/26/15 0614  WBC 11.0  HGB 12.8  HCT 40.3  PLT 234    Chemistries   Recent Labs Lab 06/25/15 2327 06/26/15 0614  06/27/15 0427  NA 143 144  --   --   K 2.2* 2.6*  < > 3.2*  CL 100* 102  --   --   CO2 34* 32  --   --   GLUCOSE 109* 133*  --   --   BUN 22* 17  --   --   CREATININE 1.07* 0.91  --   --   CALCIUM 9.6 9.4  --   --   MG 2.1  --   --   --   < > = values in this interval not displayed.  Cardiac Enzymes  Recent Labs Lab 06/25/15 2327  TROPONINI 0.03    Microbiology Results  Results for orders placed or performed during the hospital encounter of 06/25/15  MRSA PCR Screening     Status: None   Collection Time: 06/26/15  5:31 AM  Result Value Ref Range Status   MRSA by PCR NEGATIVE NEGATIVE Final    Comment:        The GeneXpert MRSA Assay (FDA approved for NASAL specimens only), is one component of a comprehensive MRSA colonization surveillance program. It is not intended to diagnose MRSA infection nor to guide or monitor treatment for MRSA infections.     RADIOLOGY:  No results found.  EKG:   Orders placed or performed during the hospital encounter of 06/25/15  . ED EKG  . ED EKG  . EKG 12-Lead  . EKG 12-Lead      Management plans discussed with the patient, family and they are in agreement.  CODE STATUS:     Code Status Orders        Start     Ordered   06/26/15 0541  Full code   Continuous     06/26/15 0540       TOTAL TIME TAKING CARE OF THIS PATIENT: 45 minutes.    Theodoro Grist M.D on 06/27/2015 at 12:40 PM  Between 7am to 6pm - Pager - [phone]  After 6pm go to [email or web address] - password [email or web address] [facility]  Office  [phone]  CC: Primary care physician; Sharyne Peach, MD

## 2015-06-27 NOTE — Discharge Instructions (Signed)

## 2015-06-30 LAB — ALDOSTERONE + RENIN ACTIVITY W/ RATIO
ALDO / PRA Ratio: 3.1 (ref 0.0–30.0)
Aldosterone: 9.8 ng/dL (ref 0.0–30.0)
PRA LC/MS/MS: 3.15 ng/mL/hr

## 2015-11-06 ENCOUNTER — Emergency Department: Payer: Medicaid Other

## 2015-11-06 ENCOUNTER — Emergency Department
Admission: EM | Admit: 2015-11-06 | Discharge: 2015-11-06 | Disposition: A | Discharge status: Home / Self Care | Payer: Medicaid Other | Attending: Emergency Medicine | Admitting: Emergency Medicine

## 2015-11-06 ENCOUNTER — Encounter: Payer: Self-pay | Admitting: Emergency Medicine

## 2015-11-06 DIAGNOSIS — I10 Essential (primary) hypertension: Secondary | ICD-10-CM | POA: Insufficient documentation

## 2015-11-06 DIAGNOSIS — Z7982 Long term (current) use of aspirin: Secondary | ICD-10-CM | POA: Insufficient documentation

## 2015-11-06 DIAGNOSIS — I159 Secondary hypertension, unspecified: Secondary | ICD-10-CM | POA: Diagnosis not present

## 2015-11-06 DIAGNOSIS — Z880 Allergy status to penicillin: Secondary | ICD-10-CM | POA: Insufficient documentation

## 2015-11-06 DIAGNOSIS — Z79899 Other long term (current) drug therapy: Secondary | ICD-10-CM | POA: Diagnosis not present

## 2015-11-06 DIAGNOSIS — M25551 Pain in right hip: Secondary | ICD-10-CM | POA: Insufficient documentation

## 2015-11-06 MED ORDER — OXYCODONE-ACETAMINOPHEN 5-325 MG PO TABS
1.0000 | ORAL_TABLET | Freq: Once | ORAL | Status: AC
  Administered 2015-11-06: 1 via ORAL
  Filled 2015-11-06: qty 1

## 2015-11-06 MED ORDER — OXYCODONE-ACETAMINOPHEN 5-325 MG PO TABS: 1.0000 | ORAL_TABLET | ORAL | Status: DC | PRN

## 2015-11-06 MED ORDER — IBUPROFEN 800 MG PO TABS
800.0000 mg | ORAL_TABLET | Freq: Three times a day (TID) | ORAL | Status: DC | PRN
Start: 2015-11-06 — End: 2015-11-26

## 2015-11-06 MED ORDER — KETOROLAC TROMETHAMINE 30 MG/ML IJ SOLN
60.0000 mg | Freq: Once | INTRAMUSCULAR | Status: AC
  Administered 2015-11-06: 60 mg via INTRAMUSCULAR
  Filled 2015-11-06: qty 2

## 2015-11-06 MED ORDER — CLONIDINE HCL 0.1 MG PO TABS
0.1000 mg | ORAL_TABLET | Freq: Once | ORAL | Status: AC
  Administered 2015-11-06: 0.1 mg via ORAL

## 2015-11-06 MED ORDER — CLONIDINE HCL 0.1 MG PO TABS
ORAL_TABLET | ORAL | Status: AC
  Administered 2015-11-06: 0.1 mg via ORAL
  Filled 2015-11-06: qty 1

## 2015-11-06 NOTE — ED Notes (Signed)
Patient transported to X-ray 

## 2015-11-06 NOTE — ED Provider Notes (Signed)
[facility] Provider Note  ____________________________________________  Time seen: Approximately 2:01 AM  I have reviewed the triage vital signs and the nursing notes.   HISTORY  Chief Complaint Hip Pain    HPI Renee Wells is a 59 y.o. female who presents to the ED from home via EMS for a chief complaint of nontraumatic right hip pain. Patient has a medical history significant for stroke with left-sided deficits for which she uses a cane to ambulate.Reports issues with right ankle tendinitis several weeks ago for which she received physical therapy exercises. States she did lunges 2 weeks ago and felt pain in her right hip the following day. States right hip pain has persisted since that time. Reports sharp pain in her right hip on standing and ambulation. Symptoms improved with rest. States tonight she used the restroom and "turned my hip the wrong way" and could not get up secondary to pain. Denies falling or striking hip. Denies fever, chills, headache, neck pain, chest pain, shortness of breath, abdominal pain, nausea, vomiting, diarrhea. Does note she has gained some fluid around her abdomen and both lower extremities since her doctor discontinued her fluid medicine recently secondary to hypokalemia.   Past Medical History  Diagnosis Date  . Stroke ([place])   . Hypertension   . HLD (hyperlipidemia)   . Obesity     Patient Active Problem List   Diagnosis Date Noted  . Hyperglycemia 06/27/2015  . Obesity 06/27/2015  . Accelerated hypertension 06/26/2015  . Hypokalemia 06/26/2015  . HLD (hyperlipidemia) 06/26/2015  . Suspected CHF (congestive heart failure) 06/26/2015  . Bilateral lower extremity edema 06/26/2015    Past Surgical History  Procedure Laterality Date  . Cesarean section      Current Outpatient Rx  Name  Route  Sig  Dispense  Refill  . aspirin 325 MG EC tablet   Oral   Take 325 mg by mouth daily.          . cloNIDine (CATAPRES) 0.2 MG tablet   Oral   Take 0.2 mg by mouth 2 (two) times daily.         . hydrALAZINE (APRESOLINE) 50 MG tablet   Oral   Take 50 mg by mouth 3 (three) times daily.         [name] labetalol (NORMODYNE) 200 MG tablet   Oral   Take 1 tablet (200 mg total) by mouth 2 (two) times daily.   60 tablet   3   . lisinopril (PRINIVIL,ZESTRIL) 40 MG tablet   Oral   Take 1 tablet (40 mg total) by mouth 2 (two) times daily.   60 tablet   3   . Multiple Vitamins-Minerals (MULTIVITAMIN WITH MINERALS) tablet   Oral   Take 1 tablet by mouth daily.         . simvastatin (ZOCOR) 20 MG tablet   Oral   Take 20 mg by mouth daily.         . traZODone (DESYREL) 50 MG tablet   Oral   Take 50 mg by mouth at bedtime.           Allergies Penicillins  Family History  Problem Relation Age of Onset  . Heart disease Mother   . Hypertension Mother   . Diabetes Mother   . Diabetes Sister   . Hyperlipidemia Sister     Social History Social History  Substance Use Topics  . Smoking status: Never Smoker   . Smokeless tobacco:  None  . Alcohol Use: No     Comment: occasional    Review of Systems Constitutional: No fever/chills Eyes: No visual changes. ENT: No sore throat. Cardiovascular: Denies chest pain. Respiratory: Denies shortness of breath. Gastrointestinal: No abdominal pain.  No nausea, no vomiting.  No diarrhea.  No constipation. Genitourinary: Negative for dysuria. Musculoskeletal: Negative for back pain. Positive for right hip pain. Skin: Negative for rash. Neurological: Negative for headaches, focal weakness or numbness.  10-point ROS otherwise negative.  ____________________________________________   PHYSICAL EXAM:  VITAL SIGNS: ED Triage Vitals  Enc Vitals Group     BP --      Pulse Rate 11/06/15 0057 94     Resp 11/06/15 0057 20     Temp 11/06/15 0057 98.9 F (37.2 C)     Temp Source 11/06/15 0057 Oral     SpO2 --      Weight --       Height --      Head Cir --      Peak Flow --      Pain Score 11/06/15 0101 8     Pain Loc --      Pain Edu? --      Excl. in [place]? --     Constitutional: Alert and oriented. Well appearing and in no acute distress. Eyes: Conjunctivae are normal. PERRL. EOMI. Head: Atraumatic. Nose: No congestion/rhinnorhea. Mouth/Throat: Mucous membranes are moist.  Oropharynx non-erythematous. Neck: No stridor.  No cervical spine tenderness to palpation. Cardiovascular: Normal rate, regular rhythm. Grossly normal heart sounds.  Good peripheral circulation. Respiratory: Normal respiratory effort.  No retractions. Lungs CTAB. Gastrointestinal: Soft and nontender. No distention. No abdominal bruits. No CVA tenderness. Musculoskeletal: Pelvis stable. Right hip mildly tender to palpation anteriorly. Limited range of motion secondary to pain. There is no shortening or rotation of her right hip. 1+ bilateral lower extremity pitting edema.  No joint effusions. Neurologic:  Normal speech and language. No gross focal neurologic deficits are appreciated. No gait instability. Skin:  Skin is warm, dry and intact. No rash noted. Psychiatric: Mood and affect are normal. Speech and behavior are normal.  ____________________________________________   LABS (all labs ordered are listed, but only abnormal results are displayed)  Labs Reviewed - No data to display ____________________________________________  EKG  ED ECG REPORT I, SUNG,JADE J, the attending physician, personally viewed and interpreted this ECG.   Date: 11/06/2015  EKG Time: 0100  Rate: 92  Rhythm: normal EKG, normal sinus rhythm  Axis: Normal  Intervals:none  ST&T Change: Nonspecific  ____________________________________________  RADIOLOGY  Right hip x-rays (viewed by me, interpreted per Dr. Marisue Humble): 1. Age-indeterminate avulsion injury from the right anterior superior iliac spine. 2. Mild to moderate degenerative change of both  hips. ____________________________________________   PROCEDURES  Procedure(s) performed: None  Critical Care performed: No  ____________________________________________   INITIAL IMPRESSION / ASSESSMENT AND PLAN / ED COURSE  Pertinent labs & imaging results that were available during my care of the patient were reviewed by me and considered in my medical decision making (see chart for details).  59 year old female who presents with a two-week history of right hip pain, worse this evening. X-rays reveal age indeterminate avulsion injury of right anterior superior iliac spine. Will treat with NSAID's, analgesia and reassess.  ----------------------------------------- 3:42 AM on 11/06/2015 -----------------------------------------  Hip pain much improved. Blood pressure noted to be 223/100. Patient denies associated symptoms of headache, vision changes, chest pain or shortness of breath. Clonidine 0.1 mg  PO ordered.  ----------------------------------------- 4:19 AM on 11/06/2015 -----------------------------------------  Patient ambulated to commode without great difficulty. Patient notes her family doctor has recently discontinued her fluid medicine and is currently in the middle of adjusting her other blood pressure medicines. Patient states elevated pressure is not new or different for her. Discussed with patient and her family imaging findings of age-indeterminate avulsion injury. Pain is much improved. Will discharge patient home with a [place] and orthopedics follow-up. She is to follow up with her PCP for blood pressure recheck. Strict return precautions given. Patient and family verbalize understanding and agree with plan of care. ____________________________________________   FINAL CLINICAL IMPRESSION(S) / ED DIAGNOSES  Final diagnoses:  Right hip pain      Paulette Blanch, MD 11/06/15 [phone]

## 2015-11-06 NOTE — ED Notes (Deleted)
Patient states that he was running a fever of 100 at home and took some tylenol. Patient has a stone in his bladder and is scheduled for surgery. Patient also has a chronic foley catheter in place. Patient also reports that he has pressure ulcer to buttocks that has become larger and increase in drainage.

## 2015-11-06 NOTE — ED Notes (Signed)
Received Toradol from Pharmacy

## 2015-11-06 NOTE — ED Notes (Signed)
Sent pharmacy a message that Toradol was out of the Pyxis and called Erlene Quan to tell him I need this STAT.

## 2015-11-06 NOTE — Discharge Instructions (Signed)
1. You may take pain medicines as needed (Motrin/Percocet). 2. Use Meixner to ambulate and for balance. 3. You may apply moist heat to affected area several times daily as needed for comfort. 4. Please keep a log of your blood pressures morning, noon and night and bring these to your doctor next week. 5. Return to the ER for worsening symptoms, persistent vomiting, difficulty breathing or other concerns.  Avulsion Fracture of the Anterior Superior Iliac Spine An avulsion fracture of the anterior superior iliac spine (ASIS) is an injury to the bony part of the pelvis where a thigh muscle (sartorius) attaches to a tendon. This muscle is important in bending the hip and straightening the knee. An avulsion fracture of the ASIS commonly occurs at a growth plate on the hip bone before the growth plate has closed (fused). CAUSES This injury happens when a tendon pulls off a piece of bone during a powerful contraction of the sartorius. It often happens during activities that involve jumping or running, such as basketball, dance, track, and volleyball. RISK FACTORS This injury is more likely to occur in:  People who play sports that involve running or jumping.  People who have poor strength and flexibility.  People who do not warm up properly before practice or play.  People who have had a previous injury to the hip, thigh, or pelvis.  People who are overweight. SYMPTOMS Symptoms of this injury include:  Tenderness in the front of the injured hip.  Mild swelling, warmth, or redness over the injury.  Weakness with activity, especially when flexing the hip.  Pain with walking.  Pain with stretching the hip.  A popping sound that happens at the time of injury.  Bruising on the thigh within 1-2 days of the injury. DIAGNOSIS This injury is usually diagnosed with a physical exam and X-rays. TREATMENT This injury may be treated by:  Resting the injured area in a position that decreases the  stretch on the involved tendon.  Walking with crutches.  Taking medicines for pain.  Working with a physical therapist to regain strength and motion in the injured area.  Surgery. This may be needed in severe cases in which the bone does not heal on its own. HOME CARE INSTRUCTIONS Managing Pain, Stiffness, and Swelling  If directed, apply ice to the injured area:  Put ice in a plastic bag.  Place a towel between your skin and the bag.  Leave the ice on for 20 minutes, 2-3 times per day. Driving  Do not drive or operate heavy machinery while taking prescription pain medicine. Activity  Return to your normal activities as told by your health care provider. Ask your health care provider what activities are safe for you.  Perform exercises daily as told by your health care provider or physical therapist. Safety  Do not use the injured limb to support your body weight until your health care provider says that you can. Use crutches as told by your health care provider. General Instructions  Do not use any tobacco products, including cigarettes, chewing tobacco, or e-cigarettes. Tobacco can delay bone healing. If you need help quitting, ask your health care provider.  Take over-the-counter and prescription medicines only as told by your health care provider.  Keep all follow-up visits as told by your health care provider. This is important. SEEK MEDICAL CARE IF:  Your symptoms do not improve.  You have tingling or numbness in the thigh or leg on the side of your injury.  This information is not intended to replace advice given to you by your health care provider. Make sure you discuss any questions you have with your health care provider.   Document Released: 12/04/2005 Document Revised: 08/25/2015 Document Reviewed: 02/02/2015 Elsevier Interactive Patient Education 2016 [place] therapy can help ease sore, stiff, injured, and tight muscles and  joints. Heat relaxes your muscles, which may help ease your pain. Heat therapy should only be used on old, pre-existing, or long-lasting (chronic) injuries. Do not use heat therapy unless told by your doctor. HOW TO USE HEAT THERAPY There are several different kinds of heat therapy, including:  Moist heat pack.  Warm water bath.  Hot water bottle.  Electric heating pad.  Heated gel pack.  Heated wrap.  Electric heating pad. GENERAL HEAT THERAPY RECOMMENDATIONS   Do not sleep while using heat therapy. Only use heat therapy while you are awake.  Your skin may turn pink while using heat therapy. Do not use heat therapy if your skin turns red.  Do not use heat therapy if you have new pain.  High heat or long exposure to heat can cause burns. Be careful when using heat therapy to avoid burning your skin.  Do not use heat therapy on areas of your skin that are already irritated, such as with a rash or sunburn. GET HELP IF:   You have blisters, redness, swelling (puffiness), or numbness.  You have new pain.  Your pain is worse. MAKE SURE YOU:  Understand these instructions.  Will watch your condition.  Will get help right away if you are not doing well or get worse.   This information is not intended to replace advice given to you by your health care provider. Make sure you discuss any questions you have with your health care provider.   Document Released: 02/26/2012 Document Revised: 12/25/2014 Document Reviewed: 01/27/2014 Elsevier Interactive Patient Education 2016 [employer].  Hypertension Hypertension, commonly called high blood pressure, is when the force of blood pumping through your arteries is too strong. Your arteries are the blood vessels that carry blood from your heart throughout your body. A blood pressure reading consists of a higher number over a lower number, such as 110/72. The higher number (systolic) is the pressure inside your arteries when your heart  pumps. The lower number (diastolic) is the pressure inside your arteries when your heart relaxes. Ideally you want your blood pressure below 120/80. Hypertension forces your heart to work harder to pump blood. Your arteries may become narrow or stiff. Having untreated or uncontrolled hypertension can cause heart attack, stroke, kidney disease, and other problems. RISK FACTORS Some risk factors for high blood pressure are controllable. Others are not.  Risk factors you cannot control include:   Race. You may be at higher risk if you are African American.  Age. Risk increases with age.  Gender. Men are at higher risk than women before age 47 years. After age 70, women are at higher risk than men. Risk factors you can control include:  Not getting enough exercise or physical activity.  Being overweight.  Getting too much fat, sugar, calories, or salt in your diet.  Drinking too much alcohol. SIGNS AND SYMPTOMS Hypertension does not usually cause signs or symptoms. Extremely high blood pressure (hypertensive crisis) may cause headache, anxiety, shortness of breath, and nosebleed. DIAGNOSIS To check if you have hypertension, your health care provider will measure your blood pressure while you are seated, with your  arm held at the level of your heart. It should be measured at least twice using the same arm. Certain conditions can cause a difference in blood pressure between your right and left arms. A blood pressure reading that is higher than normal on one occasion does not mean that you need treatment. If it is not clear whether you have high blood pressure, you may be asked to return on a different day to have your blood pressure checked again. Or, you may be asked to monitor your blood pressure at home for 1 or more weeks. TREATMENT Treating high blood pressure includes making lifestyle changes and possibly taking medicine. Living a healthy lifestyle can help lower high blood pressure. You may  need to change some of your habits. Lifestyle changes may include:  Following the DASH diet. This diet is high in fruits, vegetables, and whole grains. It is low in salt, red meat, and added sugars.  Keep your sodium intake below 2,300 mg per day.  Getting at least 30-45 minutes of aerobic exercise at least 4 times per week.  Losing weight if necessary.  Not smoking.  Limiting alcoholic beverages.  Learning ways to reduce stress. Your health care provider may prescribe medicine if lifestyle changes are not enough to get your blood pressure under control, and if one of the following is true:  You are 44-74 years of age and your systolic blood pressure is above 140.  You are 106 years of age or older, and your systolic blood pressure is above 150.  Your diastolic blood pressure is above 90.  You have diabetes, and your systolic blood pressure is over 240 or your diastolic blood pressure is over 90.  You have kidney disease and your blood pressure is above 140/90.  You have heart disease and your blood pressure is above 140/90. Your personal target blood pressure may vary depending on your medical conditions, your age, and other factors. HOME CARE INSTRUCTIONS  Have your blood pressure rechecked as directed by your health care provider.   Take medicines only as directed by your health care provider. Follow the directions carefully. Blood pressure medicines must be taken as prescribed. The medicine does not work as well when you skip doses. Skipping doses also puts you at risk for problems.  Do not smoke.   Monitor your blood pressure at home as directed by your health care provider. SEEK MEDICAL CARE IF:   You think you are having a reaction to medicines taken.  You have recurrent headaches or feel dizzy.  You have swelling in your ankles.  You have trouble with your vision. SEEK IMMEDIATE MEDICAL CARE IF:  You develop a severe headache or confusion.  You have  unusual weakness, numbness, or feel faint.  You have severe chest or abdominal pain.  You vomit repeatedly.  You have trouble breathing. MAKE SURE YOU:   Understand these instructions.  Will watch your condition.  Will get help right away if you are not doing well or get worse.   This information is not intended to replace advice given to you by your health care provider. Make sure you discuss any questions you have with your health care provider.   Document Released: 12/04/2005 Document Revised: 04/20/2015 Document Reviewed: 09/26/2013 Elsevier Interactive Patient Education 2016 [employer]  Hip Pain Your hip is the joint between your upper legs and your lower pelvis. The bones, cartilage, tendons, and muscles of your hip joint perform a lot of work each day supporting  your body weight and allowing you to move around. Hip pain can range from a minor ache to severe pain in one or both of your hips. Pain may be felt on the inside of the hip joint near the groin, or the outside near the buttocks and upper thigh. You may have swelling or stiffness as well.  HOME CARE INSTRUCTIONS   Take medicines only as directed by your health care provider.  Apply ice to the injured area:  Put ice in a plastic bag.  Place a towel between your skin and the bag.  Leave the ice on for 15-20 minutes at a time, 3-4 times a day.  Keep your leg raised (elevated) when possible to lessen swelling.  Avoid activities that cause pain.  Follow specific exercises as directed by your health care provider.  Sleep with a pillow between your legs on your most comfortable side.  Record how often you have hip pain, the location of the pain, and what it feels like. SEEK MEDICAL CARE IF:   You are unable to put weight on your leg.  Your hip is red or swollen or very tender to touch.  Your pain or swelling continues or worsens after 1 week.  You have increasing difficulty walking.  You have a  fever. SEEK IMMEDIATE MEDICAL CARE IF:   You have fallen.  You have a sudden increase in pain and swelling in your hip. MAKE SURE YOU:   Understand these instructions.  Will watch your condition.  Will get help right away if you are not doing well or get worse.   This information is not intended to replace advice given to you by your health care provider. Make sure you discuss any questions you have with your health care provider.   Document Released: 05/24/2010 Document Revised: 12/25/2014 Document Reviewed: 07/31/2013 Elsevier Interactive Patient Education [employer].

## 2015-11-06 NOTE — ED Notes (Signed)
This nurse unsuccessful IV start x2, one 20G (RAC), another 22G (R Forearm) - asking Ena Dawley to try.  Patient tolerated well, no phlebitis.

## 2015-11-06 NOTE — ED Notes (Signed)
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  She hurt her hip a couple weeks ago trying to do lunges after her tx for tendonitis had completed.  She injured her hip and her pain progressive has gotten worse.  Tonight she went to use the bathroom and "turned her hip the wrong way" and started to come to the ED but could not make it down the second flight of stairs due to the pain and she called EMS to come get her.  Patient denies falling.

## 2015-11-26 ENCOUNTER — Encounter: Payer: Self-pay | Admitting: Emergency Medicine

## 2015-11-26 ENCOUNTER — Inpatient Hospital Stay: Payer: Medicaid Other

## 2015-11-26 ENCOUNTER — Inpatient Hospital Stay
Admission: EM | Admit: 2015-11-26 | Discharge: 2015-12-03 | DRG: 291 | Disposition: A | Discharge status: Home / Self Care | Payer: Medicaid Other | Attending: Internal Medicine | Admitting: Internal Medicine

## 2015-11-26 ENCOUNTER — Emergency Department: Payer: Medicaid Other

## 2015-11-26 DIAGNOSIS — Z7982 Long term (current) use of aspirin: Secondary | ICD-10-CM

## 2015-11-26 DIAGNOSIS — R160 Hepatomegaly, not elsewhere classified: Secondary | ICD-10-CM | POA: Diagnosis present

## 2015-11-26 DIAGNOSIS — E785 Hyperlipidemia, unspecified: Secondary | ICD-10-CM | POA: Diagnosis present

## 2015-11-26 DIAGNOSIS — R0602 Shortness of breath: Secondary | ICD-10-CM | POA: Diagnosis present

## 2015-11-26 DIAGNOSIS — C228 Malignant neoplasm of liver, primary, unspecified as to type: Secondary | ICD-10-CM | POA: Diagnosis present

## 2015-11-26 DIAGNOSIS — D509 Iron deficiency anemia, unspecified: Secondary | ICD-10-CM | POA: Diagnosis present

## 2015-11-26 DIAGNOSIS — I110 Hypertensive heart disease with heart failure: Secondary | ICD-10-CM | POA: Diagnosis present

## 2015-11-26 DIAGNOSIS — R319 Hematuria, unspecified: Secondary | ICD-10-CM | POA: Diagnosis present

## 2015-11-26 DIAGNOSIS — M1990 Unspecified osteoarthritis, unspecified site: Secondary | ICD-10-CM | POA: Diagnosis present

## 2015-11-26 DIAGNOSIS — C7951 Secondary malignant neoplasm of bone: Secondary | ICD-10-CM | POA: Diagnosis present

## 2015-11-26 DIAGNOSIS — R601 Generalized edema: Secondary | ICD-10-CM | POA: Diagnosis not present

## 2015-11-26 DIAGNOSIS — G4730 Sleep apnea, unspecified: Secondary | ICD-10-CM | POA: Diagnosis present

## 2015-11-26 DIAGNOSIS — R7401 Elevation of levels of liver transaminase levels: Secondary | ICD-10-CM

## 2015-11-26 DIAGNOSIS — R740 Nonspecific elevation of levels of transaminase and lactic acid dehydrogenase [LDH]: Secondary | ICD-10-CM | POA: Diagnosis present

## 2015-11-26 DIAGNOSIS — I5023 Acute on chronic systolic (congestive) heart failure: Secondary | ICD-10-CM

## 2015-11-26 DIAGNOSIS — I10 Essential (primary) hypertension: Secondary | ICD-10-CM | POA: Diagnosis present

## 2015-11-26 DIAGNOSIS — C7800 Secondary malignant neoplasm of unspecified lung: Secondary | ICD-10-CM | POA: Diagnosis present

## 2015-11-26 DIAGNOSIS — N949 Unspecified condition associated with female genital organs and menstrual cycle: Secondary | ICD-10-CM | POA: Diagnosis present

## 2015-11-26 DIAGNOSIS — I5031 Acute diastolic (congestive) heart failure: Secondary | ICD-10-CM | POA: Diagnosis not present

## 2015-11-26 DIAGNOSIS — Z6841 Body Mass Index (BMI) 40.0 and over, adult: Secondary | ICD-10-CM | POA: Diagnosis not present

## 2015-11-26 DIAGNOSIS — D638 Anemia in other chronic diseases classified elsewhere: Secondary | ICD-10-CM | POA: Diagnosis present

## 2015-11-26 DIAGNOSIS — E876 Hypokalemia: Secondary | ICD-10-CM | POA: Diagnosis present

## 2015-11-26 DIAGNOSIS — R0902 Hypoxemia: Secondary | ICD-10-CM | POA: Diagnosis present

## 2015-11-26 DIAGNOSIS — D649 Anemia, unspecified: Secondary | ICD-10-CM | POA: Diagnosis present

## 2015-11-26 DIAGNOSIS — I81 Portal vein thrombosis: Secondary | ICD-10-CM | POA: Diagnosis present

## 2015-11-26 DIAGNOSIS — Z880 Allergy status to penicillin: Secondary | ICD-10-CM

## 2015-11-26 DIAGNOSIS — E870 Hyperosmolality and hypernatremia: Secondary | ICD-10-CM | POA: Diagnosis not present

## 2015-11-26 DIAGNOSIS — E1165 Type 2 diabetes mellitus with hyperglycemia: Secondary | ICD-10-CM | POA: Diagnosis present

## 2015-11-26 DIAGNOSIS — D696 Thrombocytopenia, unspecified: Secondary | ICD-10-CM | POA: Diagnosis present

## 2015-11-26 DIAGNOSIS — R609 Edema, unspecified: Secondary | ICD-10-CM | POA: Diagnosis not present

## 2015-11-26 DIAGNOSIS — R739 Hyperglycemia, unspecified: Secondary | ICD-10-CM

## 2015-11-26 DIAGNOSIS — I69354 Hemiplegia and hemiparesis following cerebral infarction affecting left non-dominant side: Secondary | ICD-10-CM

## 2015-11-26 DIAGNOSIS — R0609 Other forms of dyspnea: Secondary | ICD-10-CM | POA: Diagnosis not present

## 2015-11-26 DIAGNOSIS — Z79899 Other long term (current) drug therapy: Secondary | ICD-10-CM | POA: Diagnosis not present

## 2015-11-26 DIAGNOSIS — R809 Proteinuria, unspecified: Secondary | ICD-10-CM | POA: Diagnosis not present

## 2015-11-26 DIAGNOSIS — J9611 Chronic respiratory failure with hypoxia: Secondary | ICD-10-CM | POA: Diagnosis present

## 2015-11-26 DIAGNOSIS — C799 Secondary malignant neoplasm of unspecified site: Secondary | ICD-10-CM | POA: Diagnosis present

## 2015-11-26 DIAGNOSIS — E119 Type 2 diabetes mellitus without complications: Secondary | ICD-10-CM

## 2015-11-26 HISTORY — DX: Generalized edema: R60.1

## 2015-11-26 HISTORY — DX: Type 2 diabetes mellitus without complications: E11.9

## 2015-11-26 HISTORY — DX: Secondary malignant neoplasm of unspecified site: C79.9

## 2015-11-26 HISTORY — DX: Essential (primary) hypertension: I10

## 2015-11-26 HISTORY — DX: Morbid (severe) obesity due to excess calories: E66.01

## 2015-11-26 HISTORY — DX: Hypokalemia: E87.6

## 2015-11-26 LAB — COMPREHENSIVE METABOLIC PANEL
ALT: 72 U/L — AB (ref 14–54)
AST: 48 U/L — AB (ref 15–41)
Albumin: 3.2 g/dL — ABNORMAL LOW (ref 3.5–5.0)
Alkaline Phosphatase: 238 U/L — ABNORMAL HIGH (ref 38–126)
Anion gap: 13 (ref 5–15)
BUN: 24 mg/dL — ABNORMAL HIGH (ref 6–20)
CHLORIDE: 99 mmol/L — AB (ref 101–111)
CO2: 32 mmol/L (ref 22–32)
CREATININE: 1.19 mg/dL — AB (ref 0.44–1.00)
Calcium: 9.1 mg/dL (ref 8.9–10.3)
GFR, EST AFRICAN AMERICAN: 56 mL/min — AB (ref >60)
GFR, EST NON AFRICAN AMERICAN: 49 mL/min — AB (ref >60)
Glucose, Bld: 678 mg/dL (ref 65–99)
POTASSIUM: 2.6 mmol/L — AB (ref 3.5–5.1)
Sodium: 144 mmol/L (ref 135–145)
TOTAL PROTEIN: 6.1 g/dL — AB (ref 6.5–8.1)
Total Bilirubin: 1.3 mg/dL — ABNORMAL HIGH (ref 0.3–1.2)

## 2015-11-26 LAB — GLUCOSE, CAPILLARY
Glucose-Capillary: 524 mg/dL — ABNORMAL HIGH (ref 65–99)
Glucose-Capillary: 471 mg/dL — ABNORMAL HIGH (ref 65–99)

## 2015-11-26 LAB — CBC
HEMATOCRIT: 31.5 % — AB (ref 35.0–47.0)
Hemoglobin: 9.6 g/dL — ABNORMAL LOW (ref 12.0–16.0)
MCH: 23.1 pg — AB (ref 26.0–34.0)
MCHC: 30.5 g/dL — ABNORMAL LOW (ref 32.0–36.0)
MCV: 75.6 fL — ABNORMAL LOW (ref 80.0–100.0)
PLATELETS: 105 10*3/uL — AB (ref 150–440)
RBC: 4.17 MIL/uL (ref 3.80–5.20)
RDW: 19 % — AB (ref 11.5–14.5)
WBC: 6.9 10*3/uL (ref 3.6–11.0)

## 2015-11-26 LAB — MAGNESIUM: MAGNESIUM: 2.1 mg/dL (ref 1.7–2.4)

## 2015-11-26 LAB — URINALYSIS COMPLETE WITH MICROSCOPIC (ARMC ONLY)
BILIRUBIN URINE: NEGATIVE
Leukocytes, UA: NEGATIVE
NITRITE: NEGATIVE
Protein, ur: NEGATIVE mg/dL
Specific Gravity, Urine: 1.021 (ref 1.005–1.030)
pH: 7 (ref 5.0–8.0)

## 2015-11-26 LAB — TSH: TSH: 1.031 u[IU]/mL (ref 0.350–4.500)

## 2015-11-26 LAB — TROPONIN I: Troponin I: <0.03 ng/mL (ref <0.031)

## 2015-11-26 LAB — BRAIN NATRIURETIC PEPTIDE: B NATRIURETIC PEPTIDE 5: 146 pg/mL — AB (ref 0.0–100.0)

## 2015-11-26 MED ORDER — CLONIDINE HCL 0.1 MG PO TABS
0.3000 mg | ORAL_TABLET | Freq: Two times a day (BID) | ORAL | Status: DC
  Administered 2015-11-26 – 2015-12-03 (×14): 0.3 mg via ORAL
  Filled 2015-11-26 (×14): qty 3

## 2015-11-26 MED ORDER — INSULIN ASPART 100 UNIT/ML ~~LOC~~ SOLN: 0.0000 [IU] | Freq: Three times a day (TID) | SUBCUTANEOUS | Status: DC

## 2015-11-26 MED ORDER — ASPIRIN EC 325 MG PO TBEC
325.0000 mg | DELAYED_RELEASE_TABLET | Freq: Every day | ORAL | Status: DC
  Administered 2015-11-27: 325 mg via ORAL
  Filled 2015-11-26: qty 1

## 2015-11-26 MED ORDER — INSULIN ASPART 100 UNIT/ML ~~LOC~~ SOLN
0.0000 [IU] | Freq: Every day | SUBCUTANEOUS | Status: DC
  Filled 2015-11-26: qty 10

## 2015-11-26 MED ORDER — OXYCODONE-ACETAMINOPHEN 5-325 MG PO TABS
1.0000 | ORAL_TABLET | ORAL | Status: DC | PRN
  Administered 2015-12-01: 1 via ORAL
  Filled 2015-11-26: qty 1

## 2015-11-26 MED ORDER — HEPARIN SODIUM (PORCINE) 5000 UNIT/ML IJ SOLN
5000.0000 [IU] | Freq: Three times a day (TID) | INTRAMUSCULAR | Status: DC
  Administered 2015-11-26 – 2015-11-28 (×4): 5000 [IU] via SUBCUTANEOUS
  Filled 2015-11-26 (×4): qty 1

## 2015-11-26 MED ORDER — LABETALOL HCL 200 MG PO TABS
200.0000 mg | ORAL_TABLET | Freq: Two times a day (BID) | ORAL | Status: DC
  Administered 2015-11-26 – 2015-12-01 (×10): 200 mg via ORAL
  Filled 2015-11-26 (×10): qty 1

## 2015-11-26 MED ORDER — ONDANSETRON HCL 4 MG PO TABS: 4.0000 mg | ORAL_TABLET | Freq: Four times a day (QID) | ORAL | Status: DC | PRN

## 2015-11-26 MED ORDER — NITROGLYCERIN 2 % TD OINT
1.0000 [in_us] | TOPICAL_OINTMENT | Freq: Once | TRANSDERMAL | Status: AC
  Administered 2015-11-26: 1 [in_us] via TOPICAL
  Filled 2015-11-26: qty 1

## 2015-11-26 MED ORDER — ONDANSETRON HCL 4 MG/2ML IJ SOLN: 4.0000 mg | Freq: Four times a day (QID) | INTRAMUSCULAR | Status: DC | PRN

## 2015-11-26 MED ORDER — FUROSEMIDE 10 MG/ML IJ SOLN
20.0000 mg | Freq: Once | INTRAMUSCULAR | Status: AC
  Administered 2015-11-26: 20 mg via INTRAVENOUS
  Filled 2015-11-26: qty 4

## 2015-11-26 MED ORDER — FUROSEMIDE 10 MG/ML IJ SOLN: 20.0000 mg | Freq: Once | INTRAMUSCULAR | Status: DC

## 2015-11-26 MED ORDER — SODIUM CHLORIDE 0.9 % IJ SOLN
3.0000 mL | Freq: Two times a day (BID) | INTRAMUSCULAR | Status: DC
  Administered 2015-11-26 – 2015-12-03 (×14): 3 mL via INTRAVENOUS

## 2015-11-26 MED ORDER — HYDRALAZINE HCL 20 MG/ML IJ SOLN
10.0000 mg | Freq: Four times a day (QID) | INTRAMUSCULAR | Status: DC | PRN
  Administered 2015-11-26: 10 mg via INTRAVENOUS
  Filled 2015-11-26: qty 1

## 2015-11-26 MED ORDER — INSULIN ASPART 100 UNIT/ML ~~LOC~~ SOLN
10.0000 [IU] | Freq: Once | SUBCUTANEOUS | Status: AC
  Administered 2015-11-26: 10 [IU] via SUBCUTANEOUS

## 2015-11-26 MED ORDER — ACETAMINOPHEN 650 MG RE SUPP: 650.0000 mg | Freq: Four times a day (QID) | RECTAL | Status: DC | PRN

## 2015-11-26 MED ORDER — POTASSIUM CHLORIDE CRYS ER 20 MEQ PO TBCR
40.0000 meq | EXTENDED_RELEASE_TABLET | ORAL | Status: DC
  Administered 2015-11-26 – 2015-11-27 (×2): 40 meq via ORAL
  Filled 2015-11-26 (×2): qty 2

## 2015-11-26 MED ORDER — HYDRALAZINE HCL 50 MG PO TABS
100.0000 mg | ORAL_TABLET | Freq: Three times a day (TID) | ORAL | Status: DC
  Administered 2015-11-26 – 2015-11-27 (×2): 100 mg via ORAL
  Filled 2015-11-26 (×2): qty 2

## 2015-11-26 MED ORDER — ISOSORBIDE MONONITRATE ER 60 MG PO TB24
60.0000 mg | ORAL_TABLET | Freq: Every day | ORAL | Status: DC
  Administered 2015-11-27 – 2015-12-03 (×7): 60 mg via ORAL
  Filled 2015-11-26 (×7): qty 1

## 2015-11-26 MED ORDER — INSULIN ASPART 100 UNIT/ML ~~LOC~~ SOLN
10.0000 [IU] | Freq: Once | SUBCUTANEOUS | Status: AC
  Administered 2015-11-26: 10 [IU] via SUBCUTANEOUS
  Filled 2015-11-26: qty 10

## 2015-11-26 MED ORDER — ACETAMINOPHEN 325 MG PO TABS: 650.0000 mg | ORAL_TABLET | Freq: Four times a day (QID) | ORAL | Status: DC | PRN

## 2015-11-26 MED ORDER — POLYETHYLENE GLYCOL 3350 17 G PO PACK: 17.0000 g | PACK | Freq: Every day | ORAL | Status: DC | PRN

## 2015-11-26 MED ORDER — POTASSIUM CHLORIDE CRYS ER 20 MEQ PO TBCR
40.0000 meq | EXTENDED_RELEASE_TABLET | Freq: Once | ORAL | Status: AC
  Administered 2015-11-26: 40 meq via ORAL
  Filled 2015-11-26: qty 2

## 2015-11-26 NOTE — ED Notes (Signed)
Charge RN Nira Conn attempting to place IV, unsuccessful.

## 2015-11-26 NOTE — ED Notes (Signed)
Not given lasix at this time per Dr. Karma Greaser, will give lasix 30 min after potassium given when pharm sends to ED

## 2015-11-26 NOTE — Progress Notes (Signed)
Patient's CBG upon admission to unit 471. Notified MD and given orders to give 10 units of insulin and recheck in one hour. Nursing staff will continue to monitor. Earleen Reaper, RN

## 2015-11-26 NOTE — Progress Notes (Signed)
Patient arrived to 2[facility] Room 256. Patient denies pain and all questions answered. Patient oriented to unit and Fall Safety Plan signed. Skin assessment completed with Lanette Hampshire. Bruises noted on L hand and R breast with smaller ones scattered throughout from previous fall as reported by patient. Nursing staff will continue to monitor. Earleen Reaper, RN

## 2015-11-26 NOTE — ED Notes (Signed)
MD Karma Greaser at bedside

## 2015-11-26 NOTE — H&P (Signed)
[facility] [place] NAME: Renee Wells    MR#:  [id number]  DATE OF BIRTH:  1956/02/20  DATE OF ADMISSION:  11/26/2015  PRIMARY CARE PHYSICIAN: Sharyne Peach, MD   REQUESTING/REFERRING PHYSICIAN: Dr Karma Greaser  CHIEF COMPLAINT:   Chief Complaint  Patient presents with  . Shortness of Breath    HISTORY OF PRESENT ILLNESS:  Renee Wells  is a 59 y.o. female with a known history of CVA with residual left arm weakness, hypertension, hypokalemia and lower extremity edema presents today with worsening of the same. She was admitted with similar symptoms from 7/8-7/10 2016. She states that prior to that admission she had been on hydrochlorothiazide but her PCP had noted her potassium to be low so I discontinued it. She then began to accumulate significant amounts of fluid. During the admission in July she was treated with nicardipine drip for blood pressure control, started on labetalol and increased dose of lisinopril. She was seen recently by her primary care provider and at that time again had low potassium so was started on spironolactone. She did not tolerate spironolactone feels that it made her dizzy and nauseated. Today she presents because the swelling has become so severe that she is short of breath with any exertion. She has wheezing with any motion or even with laying down. On presentation her potassium is 2.6. She is being admitted for hypokalemia and further evaluation of her swelling and hypertension.  PAST MEDICAL HISTORY:   Past Medical History  Diagnosis Date  . Stroke ([place])   . Hypertension   . HLD (hyperlipidemia)   . Obesity     PAST SURGICAL HISTORY:   Past Surgical History  Procedure Laterality Date  . Cesarean section      SOCIAL HISTORY:   Social History  Substance Use Topics  . Smoking status: Never Smoker   . Smokeless tobacco: Not on file  . Alcohol Use: No     Comment: occasional    FAMILY  HISTORY:   Family History  Problem Relation Age of Onset  . Heart disease Mother   . Hypertension Mother   . Diabetes Mother   . Diabetes Sister   . Hyperlipidemia Sister     DRUG ALLERGIES:   Allergies  Allergen Reactions  . Penicillins Itching    REVIEW OF SYSTEMS:   Review of Systems  Constitutional: Positive for malaise/fatigue. Negative for fever, chills and weight loss.  HENT: Negative for congestion and hearing loss.   Eyes: Negative for blurred vision and pain.  Respiratory: Positive for shortness of breath and wheezing. Negative for cough, hemoptysis, sputum production and stridor.   Cardiovascular: Positive for orthopnea and leg swelling. Negative for chest pain and palpitations.  Gastrointestinal: Negative for nausea, vomiting, abdominal pain, diarrhea, constipation and blood in stool.  Genitourinary: Negative for dysuria and frequency.  Musculoskeletal: Negative for myalgias, back pain, joint pain and neck pain.  Skin: Positive for rash.  Neurological: Positive for weakness. Negative for focal weakness, loss of consciousness and headaches.  Endo/Heme/Allergies: Does not bruise/bleed easily.  Psychiatric/Behavioral: Negative for depression and hallucinations. The patient is not nervous/anxious.     MEDICATIONS AT HOME:   Prior to Admission medications   Medication Sig Start Date End Date Taking? Authorizing Provider  amLODipine (NORVASC) 10 MG tablet Take 10 mg by mouth daily.   Yes Historical Provider, MD  aspirin 325 MG EC tablet Take 325 mg by mouth daily.   Yes  Historical Provider, MD  atorvastatin (LIPITOR) 20 MG tablet Take 20 mg by mouth daily.   Yes Historical Provider, MD  cloNIDine (CATAPRES) 0.3 MG tablet Take 0.3 mg by mouth 2 (two) times daily.   Yes Historical Provider, MD  hydrALAZINE (APRESOLINE) 100 MG tablet Take 100 mg by mouth 3 (three) times daily.   Yes Historical Provider, MD  isosorbide mononitrate (IMDUR) 60 MG 24 hr tablet Take 60 mg by  mouth daily.   Yes Historical Provider, MD  labetalol (NORMODYNE) 200 MG tablet Take 1 tablet (200 mg total) by mouth 2 (two) times daily. 06/27/15  Yes Theodoro Grist, MD  lisinopril (PRINIVIL,ZESTRIL) 40 MG tablet Take 1 tablet (40 mg total) by mouth 2 (two) times daily. 06/27/15  Yes Theodoro Grist, MD  spironolactone (ALDACTONE) 25 MG tablet Take 25 mg by mouth daily.   Yes Historical Provider, MD  oxyCODONE-acetaminophen (ROXICET) 5-325 MG tablet Take 1 tablet by mouth every 4 (four) hours as needed for severe pain. 11/06/15   Paulette Blanch, MD      VITAL SIGNS:  Blood pressure 184/99, pulse 86, temperature 98.4 F (36.9 C), temperature source Oral, resp. rate 19, height '5\' 5"'$  (1.651 m), weight 124.739 kg (275 lb), SpO2 100 %.  PHYSICAL EXAMINATION:  GENERAL:  60 y.o.-year-old patient sitting up the bed with no acute distress. Obese EYES: Pupils equal, round, reactive to light and accommodation. No scleral icterus. Extraocular muscles intact.  HEENT: Head atraumatic, normocephalic. Oropharynx and nasopharynx clear. Good dentition, face is round, her son feels that she looks different than she did several months ago NECK:  Supple, no jugular venous distention. No thyroid enlargement, no tenderness.  LUNGS: Short shallow respirations, no wheezes rhonchi or rales, no respiratory distress at rest  CARDIOVASCULAR: Distant S1, S2 normal. No murmurs, rubs, or gallops.  ABDOMEN: Soft, nontender, nondistended. Bowel sounds present. No organomegaly or mass. No guarding no rebound EXTREMITIES: 2+ edema of all 4 extremities and the abdomen, no cyanosis, or clubbing.  NEUROLOGIC: Cranial nerves II through XII are grossly intact. Muscle strength 5/5 in all extremities with the exception of the left arm strength 4 out of 5. Sensation intact. Gait not checked.  PSYCHIATRIC: The patient is alert and oriented x 3.  SKIN: No obvious rash, lesion, or ulcer.   LABORATORY PANEL:   CBC  Recent Labs Lab  11/26/15 1834  WBC 6.9  HGB 9.6*  HCT 31.5*  PLT 105*   ------------------------------------------------------------------------------------------------------------------  Chemistries   Recent Labs Lab 11/26/15 1834  NA 144  K 2.6*  CL 99*  CO2 32  GLUCOSE 678*  BUN 24*  CREATININE 1.19*  CALCIUM 9.1  MG 2.1  AST 48*  ALT 72*  ALKPHOS 238*  BILITOT 1.3*   ------------------------------------------------------------------------------------------------------------------  Cardiac Enzymes  Recent Labs Lab 11/26/15 1834  TROPONINI <0.03   ------------------------------------------------------------------------------------------------------------------  RADIOLOGY:  Dg Chest Portable 1 View  11/26/2015  CLINICAL DATA:  59 year old with progressively worsening shortness of breath and bilateral upper extremity and lower extremity edema over the past few days. EXAM: PORTABLE CHEST 1 VIEW COMPARISON:  06/03/2014. FINDINGS: Marked elevation of the right hemidiaphragm with scar/atelectasis at the right lung base. Cardiac silhouette mildly to moderately enlarged. Pulmonary venous hypertension without overt edema. No visible pleural effusions. IMPRESSION: 1. Cardiomegaly. Pulmonary venous hypertension without overt edema currently. 2. Elevation of the right hemidiaphragm with scar/atelectasis at the right lung base. Electronically Signed   By: Evangeline Dakin M.D.   On: 11/26/2015 17:56  Dg Hand Complete Left  11/26/2015  CLINICAL DATA:  Fall onto left hand. Left hand swelling and pain. Initial encounter. EXAM: LEFT HAND - COMPLETE 3+ VIEW COMPARISON:  None. FINDINGS: There is no evidence of fracture or dislocation. Mild degenerative spurring is seen involving the metacarpophalangeal joint of the thumb. No other bone abnormality identified. Marked soft tissue swelling is seen involving the dorsum of the hand and the wrist. IMPRESSION: Marked hand and wrist soft tissue swelling. No  evidence of fracture or dislocation. Electronically Signed   By: Earle Gell M.D.   On: 11/26/2015 20:04    EKG:   Orders placed or performed during the hospital encounter of 11/26/15  . EKG 12-Lead  . EKG 12-Lead    IMPRESSION AND PLAN:   1. Hypokalemia and hypertension: Replace, check magnesium. 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 She does need potassium replacement now so unable to perform 24-hour urine potassium without altering results. We'll obtain spot urine potassium and spot urine sodium to assess for potassium wasting. During her last admission labs to assess renin angiotensin system were checked and were largely normal. I will repeat these today. Her habitus and hyperglycemia also suggest possible Cushing's I will order 24 hour urine cortisol as well as serum cortisol levels. Will also request nephrology consultation  2. Hypertension: Continue home medications including clonidine, hydralazine, Imdur, labetalol, lisinopril. She is not taking spironolactone at home she does not tolerate this medication. I will hold amlodipine as it can contribute to swelling. Have when necessary hydralazine available  3. Anasarca: Serum albumin is slightly low at 3.2, doses not likely the cause. Amlodipine can contribute to lower extremity edema, but would not cause anasarca. Will check 2-D echocardiogram. Again concern is for endocrinologic disorder. Check TSH and cortisol. Once potassium is above 3 could consider diuresis. Check echo.  4. Hyperglycemia: She reports that she does not have a history of diabetes. We'll check hemoglobin A1c and start sliding scale.  5. Shortness of breath, mild hypoxia: O2 sat 90% on room air. Likely due to body habitus and hypoventilation. Chest x-ray shows cephalization of the pulmonary vasculature, no overt pneumonia or edema. She does not wheeze at rest, does not seem bronchoconstrictive.  6. Deconditioning: Physical therapy evaluation.  7.  Prophylaxis: Heparin for DVT prophylaxis. No GI prophylaxis at this time  8. Transaminitis: Will obtain ultrasound of the abdomen  9. Anemia and thrombocytopenia: We'll order anemia panel  All the records are reviewed and case discussed with ED provider. Management plans discussed with the patient, family and they are in agreement.  CODE STATUS: full  TOTAL TIME TAKING CARE OF THIS PATIENT: 55 minutes.  Greater than 50% of time spent in coordination of care and counseling.  Myrtis Ser M.D on 11/26/2015 at 8:49 PM  Between 7am to 6pm - Pager - [phone]  After 6pm go to [email or web address] - password [email or web address] [facility]  Office  [phone]  CC: Primary care physician; Sharyne Peach, MD

## 2015-11-26 NOTE — ED Notes (Signed)
Greg at bedside at this time attempting IV placement.

## 2015-11-26 NOTE — ED Notes (Addendum)
Multiple RN attempting IV placement, 4 unsuccessful tries at this time.

## 2015-11-26 NOTE — ED Notes (Addendum)
States she feels like her fluid is building up  Increased SOB swelling noted to both hands and 3+edema noted to bilateral lower legs

## 2015-11-26 NOTE — ED Notes (Signed)
Pharm called for potassium to be tubed to ED

## 2015-11-26 NOTE — ED Provider Notes (Signed)
[facility] Emergency Department Provider Note  ____________________________________________  Time seen: Approximately 7:10 PM  I have reviewed the triage vital signs and the nursing notes.   HISTORY  Chief Complaint Shortness of Breath    HPI Renee Wells is a 59 y.o. female with history of CHF previously on Lasix, hypertension, hyperlipidemia, and morbid obesity who presents with gradually increasing shortness of breath over the last several weeks.  It is from mild to severe at this time and she has severe shortness of breath with any amount of exertion.  She also notes that she has severe swellingthroughout her entire body since she was taken off of her Lasix due to hypokalemia about one month ago.  Her regular doctor is Dr. Iona Beard.  She recently tried the patient on spironolactone but apparently the patient cannot tolerate this so she is taken back off of it.  Since that time the swelling is gotten worse and worse and is also quite severe.  He occasionally has wheezing and is dependent on position for her breathing.  She denies chest pain, abdominal pain, nausea/vomiting.   Past Medical History  Diagnosis Date  . Stroke ([place])   . Hypertension   . HLD (hyperlipidemia)   . Obesity     Patient Active Problem List   Diagnosis Date Noted  . Hyperglycemia 06/27/2015  . Obesity 06/27/2015  . Accelerated hypertension 06/26/2015  . Hypokalemia 06/26/2015  . HLD (hyperlipidemia) 06/26/2015  . Suspected CHF (congestive heart failure) 06/26/2015  . Bilateral lower extremity edema 06/26/2015    Past Surgical History  Procedure Laterality Date  . Cesarean section      No current outpatient prescriptions on file.  Allergies Penicillins  Family History  Problem Relation Age of Onset  . Heart disease Mother   . Hypertension Mother   . Diabetes Mother   . Diabetes Sister   . Hyperlipidemia Sister     Social History Social History  Substance Use  Topics  . Smoking status: Never Smoker   . Smokeless tobacco: None  . Alcohol Use: No     Comment: occasional    Review of Systems Constitutional: No fever/chills Eyes: No visual changes. ENT: No sore throat. Cardiovascular: Denies chest pain. Respiratory: No shortness of breath particularly with exertion Gastrointestinal: No abdominal pain.  No nausea, no vomiting.  No diarrhea.  No constipation. Genitourinary: Negative for dysuria. Musculoskeletal: Negative for back pain. Skin: Negative for rash.  Swelling throughout her entire body severe pitting edema Neurological: Negative for headaches, focal weakness or numbness.  10-point ROS otherwise negative.  ____________________________________________   PHYSICAL EXAM:  VITAL SIGNS: ED Triage Vitals  Enc Vitals Group     BP 11/26/15 1649 173/84 mmHg     Pulse Rate 11/26/15 1649 96     Resp 11/26/15 1649 22     Temp 11/26/15 1649 98.4 F (36.9 C)     Temp Source 11/26/15 1649 Oral     SpO2 11/26/15 1649 90 %     Weight 11/26/15 1649 275 lb (124.739 kg)     Height 11/26/15 1649 '5\' 5"'$  (1.651 m)     Head Cir --      Peak Flow --      Pain Score 11/26/15 1649 4     Pain Loc --      Pain Edu? --      Excl. in [place] --     Constitutional: Alert and oriented.  Morbid obesity but in no acute distress  Eyes: Conjunctivae are normal. PERRL. EOMI. Head: Atraumatic. Nose: No congestion/rhinnorhea. Mouth/Throat: Mucous membranes are moist.  Oropharynx non-erythematous. Neck: No stridor.   Cardiovascular: Normal rate, regular rhythm. Grossly normal heart sounds.  Good peripheral circulation. Respiratory: Increased respiratory effort.  No retractions. Lungs CTAB. Gastrointestinal: Soft and nontender. No distention. No abdominal bruits. No CVA tenderness. Musculoskeletal: Severe 2+ pitting edema throughout her body including upper and lower extremities as well as, I suspect, her abdomen.  She also has some pain and bruising in her left  hand which she states was a result of a contusion/fall.  Her anasarca is so substantial that is difficult for her to flex her joints. Neurologic:  Normal speech and language. No gross focal neurologic deficits are appreciated.  Skin:  Skin is warm, dry and intact. No rash noted. Psychiatric: Mood and affect are normal. Speech and behavior are normal.  ____________________________________________   LABS (all labs ordered are listed, but only abnormal results are displayed)  Labs Reviewed  CBC - Abnormal; Notable for the following:    Hemoglobin 9.6 (*)    HCT 31.5 (*)    MCV 75.6 (*)    MCH 23.1 (*)    MCHC 30.5 (*)    RDW 19.0 (*)    Platelets 105 (*)    All other components within normal limits  COMPREHENSIVE METABOLIC PANEL - Abnormal; Notable for the following:    Potassium 2.6 (*)    Chloride 99 (*)    Glucose, Bld 678 (*)    BUN 24 (*)    Creatinine, Ser 1.19 (*)    Total Protein 6.1 (*)    Albumin 3.2 (*)    AST 48 (*)    ALT 72 (*)    Alkaline Phosphatase 238 (*)    Total Bilirubin 1.3 (*)    GFR calc non Af Amer 49 (*)    GFR calc Af Amer 56 (*)    All other components within normal limits  BRAIN NATRIURETIC PEPTIDE - Abnormal; Notable for the following:    B Natriuretic Peptide 146.0 (*)    All other components within normal limits  URINALYSIS COMPLETEWITH MICROSCOPIC ([facility] ONLY) - Abnormal; Notable for the following:    Color, Urine STRAW (*)    APPearance CLEAR (*)    Glucose, UA >500 (*)    Ketones, ur TRACE (*)    Hgb urine dipstick 1+ (*)    Bacteria, UA RARE (*)    Squamous Epithelial / LPF 0-5 (*)    All other components within normal limits  GLUCOSE, CAPILLARY - Abnormal; Notable for the following:    Glucose-Capillary 471 (*)    All other components within normal limits  GLUCOSE, CAPILLARY - Abnormal; Notable for the following:    Glucose-Capillary 524 (*)    All other components within normal limits  MAGNESIUM  TROPONIN I  TSH  HEMOGLOBIN  A1C  CORTISOL-AM, BLOOD  CORTISOL-PM, BLOOD  ALDOSTERONE + RENIN ACTIVITY W/ RATIO  CBC  BASIC METABOLIC PANEL  CORTISOL, URINE, FREE  POTASSIUM, URINE RANDOM  SODIUM, URINE, RANDOM  CREATININE, URINE, RANDOM  POTASSIUM  VITAMIN B12  FOLATE  IRON AND TIBC  FERRITIN  RETICULOCYTES   ____________________________________________  EKG  ED ECG REPORT I, Orchid Glassberg, the attending physician, personally viewed and interpreted this ECG.  Date: 11/26/2015 EKG Time: 17:11 Rate: 94 Rhythm: normal sinus rhythm with occasional PVCs QRS Axis: normal Intervals: normal ST/T Wave abnormalities: Non-specific ST segment / T-wave changes, but no evidence of  acute ischemia. Conduction Disutrbances: none Narrative Interpretation: unremarkable  ____________________________________________  RADIOLOGY   Dg Chest Portable 1 View  11/26/2015  CLINICAL DATA:  59 year old with progressively worsening shortness of breath and bilateral upper extremity and lower extremity edema over the past few days. EXAM: PORTABLE CHEST 1 VIEW COMPARISON:  06/03/2014. FINDINGS: Marked elevation of the right hemidiaphragm with scar/atelectasis at the right lung base. Cardiac silhouette mildly to moderately enlarged. Pulmonary venous hypertension without overt edema. No visible pleural effusions. IMPRESSION: 1. Cardiomegaly. Pulmonary venous hypertension without overt edema currently. 2. Elevation of the right hemidiaphragm with scar/atelectasis at the right lung base. Electronically Signed   By: Evangeline Dakin M.D.   On: 11/26/2015 17:56   Dg Hand Complete Left  11/26/2015  CLINICAL DATA:  Fall onto left hand. Left hand swelling and pain. Initial encounter. EXAM: LEFT HAND - COMPLETE 3+ VIEW COMPARISON:  None. FINDINGS: There is no evidence of fracture or dislocation. Mild degenerative spurring is seen involving the metacarpophalangeal joint of the thumb. No other bone abnormality identified. Marked soft tissue  swelling is seen involving the dorsum of the hand and the wrist. IMPRESSION: Marked hand and wrist soft tissue swelling. No evidence of fracture or dislocation. Electronically Signed   By: Earle Gell M.D.   On: 11/26/2015 20:04    ____________________________________________   PROCEDURES  Procedure(s) performed: None  Critical Care performed: No ____________________________________________   INITIAL IMPRESSION / ASSESSMENT AND PLAN / ED COURSE  Pertinent labs & imaging results that were available during my care of the patient were reviewed by me and considered in my medical decision making (see chart for details).  The patient has a constellation of severe medical diagnoses at this time: Anasarca due to discontinuation of her diuretic, acute on chronic heart failure, restrictive airway disease due to her obesity and anasarca, dyspnea on exertion, severe hypokalemia, and severe hyperglycemia.  She has only one peripheral IV at this time because she is such a difficult IV stick due to her edema and habitus.  I am reluctant to start IV potassium through the small IV.  I have given her 40 mEq of oral potassium to begin repletion and 30 minutes afterwards I gave her a dose of Lasix 20 mg IV to begin diuresis process.  I have discussed the case with Dr. Volanda Napoleon, the hospitalist, who is going to continue with regular oral repletion of potassium every few hours.  She will also further manage the patient's hyperglycemia.  ____________________________________________  FINAL CLINICAL IMPRESSION(S) / ED DIAGNOSES  Final diagnoses:  Anasarca  Acute on chronic systolic congestive heart failure ([place])  Morbid obesity, unspecified obesity type ([place])  Dyspnea on exertion  Hypokalemia  Hyperglycemia      NEW MEDICATIONS STARTED DURING THIS VISIT:  Current Discharge Medication List       Hinda Kehr, MD 11/27/15 [phone]

## 2015-11-26 NOTE — ED Notes (Signed)
Lab called for add on of TSH

## 2015-11-27 ENCOUNTER — Inpatient Hospital Stay: Payer: Medicaid Other

## 2015-11-27 ENCOUNTER — Inpatient Hospital Stay
Admit: 2015-11-27 | Discharge: 2015-11-27 | Disposition: A | Discharge status: Home / Self Care | Payer: Medicaid Other | Attending: Internal Medicine | Admitting: Internal Medicine

## 2015-11-27 ENCOUNTER — Encounter: Payer: Self-pay | Admitting: Radiology

## 2015-11-27 DIAGNOSIS — Z8673 Personal history of transient ischemic attack (TIA), and cerebral infarction without residual deficits: Secondary | ICD-10-CM

## 2015-11-27 DIAGNOSIS — R58 Hemorrhage, not elsewhere classified: Secondary | ICD-10-CM

## 2015-11-27 DIAGNOSIS — E669 Obesity, unspecified: Secondary | ICD-10-CM

## 2015-11-27 DIAGNOSIS — R609 Edema, unspecified: Secondary | ICD-10-CM

## 2015-11-27 DIAGNOSIS — D649 Anemia, unspecified: Secondary | ICD-10-CM

## 2015-11-27 DIAGNOSIS — R0602 Shortness of breath: Secondary | ICD-10-CM

## 2015-11-27 DIAGNOSIS — Z79899 Other long term (current) drug therapy: Secondary | ICD-10-CM

## 2015-11-27 DIAGNOSIS — E785 Hyperlipidemia, unspecified: Secondary | ICD-10-CM

## 2015-11-27 LAB — GLUCOSE, CAPILLARY
GLUCOSE-CAPILLARY: 498 mg/dL — AB (ref 65–99)
GLUCOSE-CAPILLARY: 350 mg/dL — AB (ref 65–99)
GLUCOSE-CAPILLARY: 393 mg/dL — AB (ref 65–99)
GLUCOSE-CAPILLARY: 398 mg/dL — AB (ref 65–99)
Glucose-Capillary: 440 mg/dL — ABNORMAL HIGH (ref 65–99)

## 2015-11-27 LAB — IRON AND TIBC
Iron: 48 ug/dL (ref 28–170)
Saturation Ratios: 21 % (ref 10.4–31.8)
TIBC: 231 ug/dL — AB (ref 250–450)
UIBC: 183 ug/dL

## 2015-11-27 LAB — FERRITIN: FERRITIN: 112 ng/mL (ref 11–307)

## 2015-11-27 LAB — PROTEIN / CREATININE RATIO, URINE
CREATININE, URINE: 90 mg/dL
Protein Creatinine Ratio: 1.3 mg/mg{Cre} — ABNORMAL HIGH (ref 0.00–0.15)
TOTAL PROTEIN, URINE: 117 mg/dL

## 2015-11-27 LAB — BASIC METABOLIC PANEL
Anion gap: 8 (ref 5–15)
BUN: 21 mg/dL — AB (ref 6–20)
CALCIUM: 8.8 mg/dL — AB (ref 8.9–10.3)
CO2: 36 mmol/L — ABNORMAL HIGH (ref 22–32)
CREATININE: 1.03 mg/dL — AB (ref 0.44–1.00)
Chloride: 103 mmol/L (ref 101–111)
GFR calc Af Amer: >60 mL/min (ref >60)
GFR, EST NON AFRICAN AMERICAN: 58 mL/min — AB (ref >60)
GLUCOSE: 386 mg/dL — AB (ref 65–99)
Potassium: 2.9 mmol/L — CL (ref 3.5–5.1)
SODIUM: 147 mmol/L — AB (ref 135–145)

## 2015-11-27 LAB — CBC
HCT: 28 % — ABNORMAL LOW (ref 35.0–47.0)
HEMOGLOBIN: 8.7 g/dL — AB (ref 12.0–16.0)
MCH: 23.3 pg — AB (ref 26.0–34.0)
MCHC: 31.2 g/dL — AB (ref 32.0–36.0)
MCV: 74.9 fL — AB (ref 80.0–100.0)
PLATELETS: 97 10*3/uL — AB (ref 150–440)
RBC: 3.74 MIL/uL — AB (ref 3.80–5.20)
RDW: 18.7 % — ABNORMAL HIGH (ref 11.5–14.5)
WBC: 6.6 10*3/uL (ref 3.6–11.0)

## 2015-11-27 LAB — TSH: TSH: 1.683 u[IU]/mL (ref 0.350–4.500)

## 2015-11-27 LAB — SODIUM, URINE, RANDOM: Sodium, Ur: <10 mmol/L

## 2015-11-27 LAB — CREATININE, URINE, RANDOM: CREATININE, URINE: 89 mg/dL

## 2015-11-27 LAB — HEMOGLOBIN A1C: Hgb A1c MFr Bld: 9.5 % — ABNORMAL HIGH (ref 4.0–6.0)

## 2015-11-27 LAB — FOLATE: Folate: 9.2 ng/mL (ref >5.9)

## 2015-11-27 LAB — POTASSIUM, URINE RANDOM: Potassium Urine: 40 mmol/L

## 2015-11-27 LAB — RETICULOCYTES
RBC.: 3.73 MIL/uL — AB (ref 3.80–5.20)
RETIC CT PCT: 3.8 % — AB (ref 0.4–3.1)
Retic Count, Absolute: 141.7 10*3/uL (ref 19.0–183.0)

## 2015-11-27 LAB — POTASSIUM: Potassium: 3.8 mmol/L (ref 3.5–5.1)

## 2015-11-27 MED ORDER — INSULIN GLARGINE 100 UNIT/ML ~~LOC~~ SOLN
20.0000 [IU] | Freq: Every day | SUBCUTANEOUS | Status: DC
  Administered 2015-11-27: 20 [IU] via SUBCUTANEOUS
  Filled 2015-11-27 (×2): qty 0.2

## 2015-11-27 MED ORDER — INSULIN ASPART 100 UNIT/ML ~~LOC~~ SOLN
10.0000 [IU] | Freq: Once | SUBCUTANEOUS | Status: AC
  Administered 2015-11-27: 10 [IU] via SUBCUTANEOUS
  Filled 2015-11-27: qty 10

## 2015-11-27 MED ORDER — IRBESARTAN 75 MG PO TABS
75.0000 mg | ORAL_TABLET | Freq: Every day | ORAL | Status: DC
  Administered 2015-11-27 – 2015-12-03 (×7): 75 mg via ORAL
  Filled 2015-11-27 (×7): qty 1

## 2015-11-27 MED ORDER — IOHEXOL 350 MG/ML SOLN
100.0000 mL | Freq: Once | INTRAVENOUS | Status: AC | PRN
  Administered 2015-11-27: 100 mL via INTRAVENOUS

## 2015-11-27 MED ORDER — CETYLPYRIDINIUM CHLORIDE 0.05 % MT LIQD
7.0000 mL | Freq: Two times a day (BID) | OROMUCOSAL | Status: DC
  Administered 2015-11-27 – 2015-12-03 (×11): 7 mL via OROMUCOSAL

## 2015-11-27 MED ORDER — POTASSIUM CHLORIDE CRYS ER 20 MEQ PO TBCR
40.0000 meq | EXTENDED_RELEASE_TABLET | Freq: Once | ORAL | Status: AC
  Administered 2015-11-27: 40 meq via ORAL
  Filled 2015-11-27: qty 2

## 2015-11-27 MED ORDER — POTASSIUM CHLORIDE CRYS ER 20 MEQ PO TBCR
40.0000 meq | EXTENDED_RELEASE_TABLET | Freq: Two times a day (BID) | ORAL | Status: DC
  Administered 2015-11-27 (×2): 40 meq via ORAL
  Filled 2015-11-27: qty 2

## 2015-11-27 MED ORDER — HYDRALAZINE HCL 50 MG PO TABS
50.0000 mg | ORAL_TABLET | Freq: Three times a day (TID) | ORAL | Status: DC
  Administered 2015-11-27 – 2015-12-03 (×19): 50 mg via ORAL
  Filled 2015-11-27 (×19): qty 1

## 2015-11-27 MED ORDER — INSULIN ASPART 100 UNIT/ML ~~LOC~~ SOLN
0.0000 [IU] | Freq: Three times a day (TID) | SUBCUTANEOUS | Status: DC
  Administered 2015-11-27 (×2): 15 [IU] via SUBCUTANEOUS
  Administered 2015-11-27: 11 [IU] via SUBCUTANEOUS
  Administered 2015-11-27 – 2015-11-28 (×3): 15 [IU] via SUBCUTANEOUS
  Administered 2015-11-28: 11 [IU] via SUBCUTANEOUS
  Administered 2015-11-28: 8 [IU] via SUBCUTANEOUS
  Administered 2015-11-29: 11 [IU] via SUBCUTANEOUS
  Filled 2015-11-27: qty 11
  Filled 2015-11-27 (×3): qty 15
  Filled 2015-11-27: qty 8
  Filled 2015-11-27 (×2): qty 11
  Filled 2015-11-27 (×2): qty 15

## 2015-11-27 MED ORDER — IOHEXOL 240 MG/ML SOLN: 50.0000 mL | INTRAMUSCULAR | Status: AC

## 2015-11-27 NOTE — Progress Notes (Signed)
2 L of oxygen. NSR. FS are stable. Takes meds ok. CT abd showed large mass in liver. Pt reports no pain. EF 55-65%. K was PO replaced. 24 hour urine started at 0920. Bedpan. Pt has no further concerns at this time.

## 2015-11-27 NOTE — Progress Notes (Signed)
Patient ID: Renee Wells, female   DOB: Apr 03, 1956, 59 y.o.   MRN: [id number] [facility] PROGRESS NOTE  PCP: Sharyne Peach, MD  HPI/Subjective: Patient has been gaining a lot of fluid weight recently she estimates around 30 pounds. She has pain in her right hip area that's been going on for a while. She's had numerous episodes of low potassium. Her sugars were found to be elevated. She states that she did not tolerate the spironolactone very well.  Objective: Filed Vitals:   11/27/15 0744 11/27/15 1109  BP: 149/75 132/68  Pulse: 88 80  Temp:    Resp: 20 20    Filed Weights   11/26/15 1649 11/26/15 2236  Weight: 124.739 kg (275 lb) 120.385 kg (265 lb 6.4 oz)    ROS: Review of Systems  Constitutional: Negative for fever and chills.  Eyes: Negative for blurred vision.  Respiratory: Negative for cough and shortness of breath.   Cardiovascular: Negative for chest pain.  Gastrointestinal: Negative for nausea, vomiting, abdominal pain, diarrhea and constipation.  Genitourinary: Negative for dysuria.  Musculoskeletal: Positive for joint pain.  Neurological: Negative for dizziness and headaches.   Exam: Physical Exam  Constitutional: She is oriented to person, place, and time.  HENT:  Nose: No mucosal edema.  Mouth/Throat: No oropharyngeal exudate or posterior oropharyngeal edema.  Eyes: Conjunctivae, EOM and lids are normal. Pupils are equal, round, and reactive to light.  Neck: No JVD present. Carotid bruit is not present. No edema present. No thyroid mass and no thyromegaly present.  Cardiovascular: S1 normal and S2 normal.  Exam reveals no gallop.   No murmur heard. Pulses:      Dorsalis pedis pulses are 2+ on the right side, and 2+ on the left side.  Respiratory: No respiratory distress. She has decreased breath sounds in the right lower field and the left lower field. She has no wheezes. She has no rhonchi. She has no rales.  GI: Soft. Bowel sounds are  normal. There is no tenderness.  Musculoskeletal:       Right ankle: She exhibits swelling.       Left ankle: She exhibits swelling.  Lymphadenopathy:    She has no cervical adenopathy.  Neurological: She is alert and oriented to person, place, and time. No cranial nerve deficit.  Skin: Skin is warm. No rash noted. Nails show no clubbing.  Psychiatric: She has a normal mood and affect.    Data Reviewed: Basic Metabolic Panel:  Recent Labs Lab 11/26/15 1834 11/27/15 0546  NA 144 147*  K 2.6* 2.9*  CL 99* 103  CO2 32 36*  GLUCOSE 678* 386*  BUN 24* 21*  CREATININE 1.19* 1.03*  CALCIUM 9.1 8.8*  MG 2.1  --    Liver Function Tests:  Recent Labs Lab 11/26/15 1834  AST 48*  ALT 72*  ALKPHOS 238*  BILITOT 1.3*  PROT 6.1*  ALBUMIN 3.2*   CBC:  Recent Labs Lab 11/26/15 1834 11/27/15 0546  WBC 6.9 6.6  HGB 9.6* 8.7*  HCT 31.5* 28.0*  MCV 75.6* 74.9*  PLT 105* 97*    CBG:  Recent Labs Lab 11/26/15 2238 11/27/15 0113 11/27/15 0235 11/27/15 0738 11/27/15 1109  GLUCAP 471* 498* 440* 350* 393*    Studies: Ct Pelvis W Contrast  11/27/2015  CLINICAL DATA:  Hepatic mass discovered on ultrasound. Concern for hepatic malignancy. EXAM: CT ABDOMEN WITHOUT AND WITH CONTRAST CT pelvis with contrast. TECHNIQUE: Multidetector CT imaging of the abdomen was performed  following the standard protocol before and following the bolus administration of intravenous contrast. CT the pelvis performed following IV contrast. CONTRAST:  138m OMNIPAQUE IOHEXOL 350 MG/ML SOLN COMPARISON:  Ultrasound 11/27/2015, radiograph 11/26/2015 FINDINGS: Lower chest: Multiple round pulmonary nodules at the lung bases of varying size consistent with lung metastasis. Example lesion in the RIGHT lower lobe measures 10 mm image 10, series 3. Example lesion LEFT lower lobe measures 11 mm on image 4, series 3. Hepatobiliary: Large RIGHT hepatic lobe mass occupying the near entirety of the posterior RIGHT  hepatic lobe (segments 5, 6 and 7). Mass measures 12 cm x 12 cm in axial dimension and 17 cm in craniocaudad dimension. RIGHT hepatic lobe mass is partially exophytic and extends inferiorly and compresses the RIGHT kidney and extend into the space between the RIGHT kidney and the IVC. Several nodular extension this at this level (image 112, series 8). There is a smaller lesion in the LEFT hepatic lobe measuring 2.2 cm on image 48, series 8. The LEFT portal vein is patent. 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 Main portal vein is patent. Pancreas: Pancreas is normal. No ductal dilatation. No pancreatic inflammation. Spleen: Normal spleen Adrenals/urinary tract: The the RIGHT adrenal gland is not identified secondary to the hepatic mass. The RIGHT kidney is depressed inferiorly. The LEFT kidney adrenal gland appear normal. Stomach/Bowel: Stomach, small bowel, appendix, and cecum are normal. The colon and rectosigmoid colon are normal. Vascular/Lymphatic: No clear lymphadenopathy. Nodule lesion between the IVC in the RIGHT kidney on image 112 is felt to be extension of the hepatic tumor itself. No pelvic lymphadenopathy. Reproductive: The fundus of the uterus has enhancing mass measuring 7.8 x 9.2 by 6.7 cm. No adnexal abnormality is obvious. Other: No free fluid. Musculoskeletal: There is an aggressive lesion along the anterior margin of the RIGHT iliac bone. This lesion or erupts through the cortex and measures 4.0 by 4.6 cm on image 164, series 8. The sclerosis of the SI joints. IMPRESSION: 1. Large RIGHT hepatic lobe mass which extends inferior to the pararenal space on the RIGHT. 2. 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 LEFT hepatic lobe lesion additionally. 3. Bilateral lower lobe pulmonary metastasis. 4. 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 5. Enhancing mass in the uterine fundus is likely a benign leiomyoma. Cannot exclude leiomyosarcoma given the extensive metastatic disease in no  clear primary. 6. Differential considerations would include primary hepatocellular carcinoma versus metastatic disease from unknown primary. Recommend biopsying liver mass and FDG PET scan. Electronically Signed   By: SSuzy BouchardM.D.   On: 11/27/2015 14:21   Ct Abd Wo & W Cm  11/27/2015  CLINICAL DATA:  Hepatic mass discovered on ultrasound. Concern for hepatic malignancy. EXAM: CT ABDOMEN WITHOUT AND WITH CONTRAST CT pelvis with contrast. TECHNIQUE: Multidetector CT imaging of the abdomen was performed following the standard protocol before and following the bolus administration of intravenous contrast. CT the pelvis performed following IV contrast. CONTRAST:  1043mOMNIPAQUE IOHEXOL 350 MG/ML SOLN COMPARISON:  Ultrasound 11/27/2015, radiograph 11/26/2015 FINDINGS: Lower chest: Multiple round pulmonary nodules at the lung bases of varying size consistent with lung metastasis. Example lesion in the RIGHT lower lobe measures 10 mm image 10, series 3. Example lesion LEFT lower lobe measures 11 mm on image 4, series 3. Hepatobiliary: Large RIGHT hepatic lobe mass occupying the near entirety of the posterior RIGHT hepatic lobe (segments 5, 6 and 7). Mass measures 12 cm x 12 cm in axial dimension  and 17 cm in craniocaudad dimension. RIGHT hepatic lobe mass is partially exophytic and extends inferiorly and compresses the RIGHT kidney and extend into the space between the RIGHT kidney and the IVC. Several nodular extension this at this level (image 112, series 8). There is a smaller lesion in the LEFT hepatic lobe measuring 2.2 cm on image 48, series 8. The LEFT portal vein is patent. 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 Main portal vein is patent. Pancreas: Pancreas is normal. No ductal dilatation. No pancreatic inflammation. Spleen: Normal spleen Adrenals/urinary tract: The the RIGHT adrenal gland is not identified secondary to the hepatic mass. The RIGHT kidney is depressed inferiorly. The LEFT kidney adrenal  gland appear normal. Stomach/Bowel: Stomach, small bowel, appendix, and cecum are normal. The colon and rectosigmoid colon are normal. Vascular/Lymphatic: No clear lymphadenopathy. Nodule lesion between the IVC in the RIGHT kidney on image 112 is felt to be extension of the hepatic tumor itself. No pelvic lymphadenopathy. Reproductive: The fundus of the uterus has enhancing mass measuring 7.8 x 9.2 by 6.7 cm. No adnexal abnormality is obvious. Other: No free fluid. Musculoskeletal: There is an aggressive lesion along the anterior margin of the RIGHT iliac bone. This lesion or erupts through the cortex and measures 4.0 by 4.6 cm on image 164, series 8. The sclerosis of the SI joints. IMPRESSION: 1. Large RIGHT hepatic lobe mass which extends inferior to the pararenal space on the RIGHT. 2. 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 LEFT hepatic lobe lesion additionally. 3. Bilateral lower lobe pulmonary metastasis. 4. 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 5. Enhancing mass in the uterine fundus is likely a benign leiomyoma. Cannot exclude leiomyosarcoma given the extensive metastatic disease in no clear primary. 6. Differential considerations would include primary hepatocellular carcinoma versus metastatic disease from unknown primary. Recommend biopsying liver mass and FDG PET scan. Electronically Signed   By: Suzy Bouchard M.D.   On: 11/27/2015 14:21   Dg Chest Portable 1 View  11/26/2015  CLINICAL DATA:  59 year old with progressively worsening shortness of breath and bilateral upper extremity and lower extremity edema over the past few days. EXAM: PORTABLE CHEST 1 VIEW COMPARISON:  06/03/2014. FINDINGS: Marked elevation of the right hemidiaphragm with scar/atelectasis at the right lung base. Cardiac silhouette mildly to moderately enlarged. Pulmonary venous hypertension without overt edema. No visible pleural effusions. IMPRESSION: 1. Cardiomegaly. Pulmonary venous hypertension  without overt edema currently. 2. Elevation of the right hemidiaphragm with scar/atelectasis at the right lung base. Electronically Signed   By: Evangeline Dakin M.D.   On: 11/26/2015 17:56   Dg Hand Complete Left  11/26/2015  CLINICAL DATA:  Fall onto left hand. Left hand swelling and pain. Initial encounter. EXAM: LEFT HAND - COMPLETE 3+ VIEW COMPARISON:  None. FINDINGS: There is no evidence of fracture or dislocation. Mild degenerative spurring is seen involving the metacarpophalangeal joint of the thumb. No other bone abnormality identified. Marked soft tissue swelling is seen involving the dorsum of the hand and the wrist. IMPRESSION: Marked hand and wrist soft tissue swelling. No evidence of fracture or dislocation. Electronically Signed   By: Earle Gell M.D.   On: 11/26/2015 20:04   US Abdomen Limited Ruq  11/27/2015  CLINICAL DATA:  Elevated liver enzymes EXAM: US ABDOMEN LIMITED - RIGHT UPPER QUADRANT COMPARISON:  None. FINDINGS: Gallbladder: The gallbladder is predominantly contracted. No gallbladder wall thickening or pericholecystic fluid. Negative sonographic Murphy's sign per Common bile duct: Diameter: Normal at 4 mm.  Liver: There is a large round heterogeneous solid-appearing mass within the RIGHT hepatic lobe measuring 18 by 11 by 12. This mass bulges the liver capsule. Additional smaller lesions noted in the LEFT hepatic lobe measuring up to 3.5 cm. No biliary duct dilatation. Mild heterogeneity of the liver parenchyma. IMPRESSION: 1. 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 Recommend CT of the abdomen and pelvis with contrast for further evaluation. 2. Contracted gallbladder without evidence of cholecystitis. These results will be called to the ordering clinician or representative by the Radiologist Assistant, and communication documented in the PACS or zVision Dashboard. Electronically Signed   By:  Suzy Bouchard M.D.   On: 11/27/2015 09:35    Scheduled Meds: . antiseptic oral rinse  7 mL Mouth Rinse BID  . aspirin  325 mg Oral Daily  . cloNIDine  0.3 mg Oral BID  . heparin  5,000 Units Subcutaneous 3 times per day  . hydrALAZINE  50 mg Oral TID  . insulin aspart  0-15 Units Subcutaneous TID AC & HS  . insulin glargine  20 Units Subcutaneous QHS  . irbesartan  75 mg Oral Daily  . isosorbide mononitrate  60 mg Oral Daily  . labetalol  200 mg Oral BID  . potassium chloride  40 mEq Oral BID  . sodium chloride  3 mL Intravenous Q12H    Assessment/Plan:  1. Metastatic cancer unknown primary. Large masses on liver, pulmonary nodules, lesion on bone and also uterus mass. Appreciate oncology consultation. I will likely have to set up for a biopsy of the liver on Monday. Patient will likely also need a PET CT scan. Hold aspirin at this time. 2. Severe hypokalemia- aggressive oral potassium replacement. Holding water pills at this point 3. Essential hypertension on numerous medications 4. Diabetes mellitus- check a hemoglobin A1c, start low-dose Lantus and continue sliding scale 5. Morbid obesity 6. History of stroke- hold aspirin for possible biopsy soon.   Code Status:     Code Status Orders        Start     Ordered   11/26/15 2241  Full code   Continuous     11/26/15 2240     Disposition Plan: To be determined  Consultants:  Nephrology  oncology  Time spent: 30 minutes   Loletha Grayer  [facility]

## 2015-11-27 NOTE — Progress Notes (Signed)
Patient's CBG 440 one hour after receiving 10 units of insulin. Notified MD and sliding scale being changed to the moderate scale. No more insulin to be given at this time and will await AM CBG. Nursing staff will continue to monitor. Earleen Reaper, RN

## 2015-11-27 NOTE — Progress Notes (Signed)
MD Earleen Newport was notified of [place] abd that showed mass and possible hepatic CA.

## 2015-11-27 NOTE — Consult Note (Signed)
Date: 11/27/2015                  Patient Name:  Renee Wells  MRN: [id number]  DOB: 26-May-1956  Age / Sex: 59 y.o., female         PCP: Sharyne Peach, MD                 Service Requesting Consult: Internal medicine                 Reason for Consult: Anasarca, hypokalemia, hypertension            History of Present Illness: Patient is a 59 y.o. female with medical problems of long-standing hypertension, hip arthritis, stroke, with residual left-sided weakness who was admitted to [facility] on 11/26/2015 for evaluation of worsening of her symptoms from edema.  Patient stated that prior to her admission she was started on hydrochlorothiazide but it was discontinued by the patient because of low potassium and weakness.  She has been tried on multiple antihypertensives to control her blood pressure Most recently she was started on spironolactone but she claims made her dizzy and nauseated. She now presents with worsening swelling, worsening shortness of breath and dyspnea on exertion  2-D echo done earlier shows normal systolic function with EF of 55-65% Trivial tricuspid regurgitation  She was drinking oral contrast for a planned CT of the abdomen, when seen earlier  Earlier, and abdominal ultrasound shows large loss in the right hepatic lobe and smaller lesions in the left hepatic lobe concerning for primary hepatic carcinoma.  Urinalysis shows greater than 500 milligrams of glucose.  It is negative for proteinuria.  1+ hemoglobin noted on urinalysis. Labs upon admission show hyponatremia, low potassium of 2.9, creatinine of 1.03 GFR greater than 60, high blood sugar of 386, transferrin saturation of 21%, anemia with hemoglobin of 8.7, low platelet count of 97 Patient denies history of diabetes   Medications: Outpatient medications: Prescriptions prior to admission  Medication Sig Dispense Refill Last Dose  . amLODipine (NORVASC) 10 MG tablet Take 10 mg by mouth daily.    11/26/2015 at Unknown time  . aspirin 325 MG EC tablet Take 325 mg by mouth daily.   11/26/2015 at Unknown time  . atorvastatin (LIPITOR) 20 MG tablet Take 20 mg by mouth daily.   11/26/2015 at Unknown time  . cloNIDine (CATAPRES) 0.3 MG tablet Take 0.3 mg by mouth 2 (two) times daily.   11/26/2015 at Unknown time  . hydrALAZINE (APRESOLINE) 100 MG tablet Take 100 mg by mouth 3 (three) times daily.   11/26/2015 at Unknown time  . isosorbide mononitrate (IMDUR) 60 MG 24 hr tablet Take 60 mg by mouth daily.   11/26/2015 at Unknown time  . labetalol (NORMODYNE) 200 MG tablet Take 1 tablet (200 mg total) by mouth 2 (two) times daily. 60 tablet 3 11/26/2015 at Unknown  . lisinopril (PRINIVIL,ZESTRIL) 40 MG tablet Take 1 tablet (40 mg total) by mouth 2 (two) times daily. 60 tablet 3 11/26/2015 at Unknown time  . spironolactone (ALDACTONE) 25 MG tablet Take 25 mg by mouth daily.   11/26/2015 at Unknown time  . oxyCODONE-acetaminophen (ROXICET) 5-325 MG tablet Take 1 tablet by mouth every 4 (four) hours as needed for severe pain. 20 tablet 0     Current medications: Current Facility-Administered Medications  Medication Dose Route Frequency Provider Last Rate Last Dose  . acetaminophen (TYLENOL) tablet 650 mg  650 mg Oral Q6H PRN Earleen Newport  Volanda Napoleon, MD       Or  . acetaminophen (TYLENOL) suppository 650 mg  650 mg Rectal Q6H PRN Aldean Jewett, MD      . antiseptic oral rinse (CPC / CETYLPYRIDINIUM CHLORIDE 0.05%) solution 7 mL  7 mL Mouth Rinse BID Aldean Jewett, MD   7 mL at 11/27/15 0030  . aspirin EC tablet 325 mg  325 mg Oral Daily Aldean Jewett, MD   325 mg at 11/27/15 0912  . cloNIDine (CATAPRES) tablet 0.3 mg  0.3 mg Oral BID Aldean Jewett, MD   0.3 mg at 11/27/15 4008  . heparin injection 5,000 Units  5,000 Units Subcutaneous 3 times per day Aldean Jewett, MD   5,000 Units at 11/27/15 6761  . hydrALAZINE (APRESOLINE) injection 10 mg  10 mg Intravenous Q6H PRN Aldean Jewett, MD   10  mg at 11/26/15 2112  . hydrALAZINE (APRESOLINE) tablet 100 mg  100 mg Oral TID Aldean Jewett, MD   100 mg at 11/27/15 0912  . insulin aspart (novoLOG) injection 0-15 Units  0-15 Units Subcutaneous TID AC & HS Lance Coon, MD   15 Units at 11/27/15 1136  . insulin glargine (LANTUS) injection 20 Units  20 Units Subcutaneous QHS Loletha Grayer, MD      . isosorbide mononitrate (IMDUR) 24 hr tablet 60 mg  60 mg Oral Daily Aldean Jewett, MD   60 mg at 11/27/15 0912  . labetalol (NORMODYNE) tablet 200 mg  200 mg Oral BID Aldean Jewett, MD   200 mg at 11/27/15 0912  . ondansetron (ZOFRAN) tablet 4 mg  4 mg Oral Q6H PRN Aldean Jewett, MD       Or  . ondansetron [facility]) injection 4 mg  4 mg Intravenous Q6H PRN Aldean Jewett, MD      . oxyCODONE-acetaminophen (PERCOCET/ROXICET) 5-325 MG per tablet 1 tablet  1 tablet Oral Q4H PRN Aldean Jewett, MD      . polyethylene glycol (MIRALAX / GLYCOLAX) packet 17 g  17 g Oral Daily PRN Aldean Jewett, MD      . potassium chloride SA (K-DUR,KLOR-CON) CR tablet 40 mEq  40 mEq Oral BID Loletha Grayer, MD   40 mEq at 11/27/15 1000  . sodium chloride 0.9 % injection 3 mL  3 mL Intravenous Q12H Aldean Jewett, MD   3 mL at 11/27/15 1000      Allergies: Allergies  Allergen Reactions  . Penicillins Itching      Past Medical History: Past Medical History  Diagnosis Date  . Stroke ([place])   . Hypertension   . HLD (hyperlipidemia)   . Obesity      Past Surgical History: Past Surgical History  Procedure Laterality Date  . Cesarean section       Family History: Family History  Problem Relation Age of Onset  . Heart disease Mother   . Hypertension Mother   . Diabetes Mother   . Diabetes Sister   . Hyperlipidemia Sister      Social History: Social History   Social History  . Marital Status: Single    Spouse Name: N/A  . Number of Children: N/A  . Years of Education: N/A   Occupational History  . Not on file.    Social History Main Topics  . Smoking status: Never Smoker   . Smokeless tobacco: Not on file  . Alcohol Use: No     Comment: occasional  .  Drug Use: No  . Sexual Activity: Not on file   Other Topics Concern  . Not on file   Social History Narrative     Review of Systems: Gen: History of weight gain due to edema HEENT: No complaints CV: Worsening generalized edema, dyspnea on exertion Resp: No cough, sputum GI: Appetite is fair, no blood in stool GU : No problems reported MS: No complaints Derm:  No complaints Psych: No complaints Heme: No complaints Neuro: Generalized weakness Endocrine blood sugar is high but patient denies history of diabetes  Vital Signs: Blood pressure 132/68, pulse 80, temperature 98.2 F (36.8 C), temperature source Oral, resp. rate 20, height '5\' 5"'$  (1.651 m), weight 120.385 kg (265 lb 6.4 oz), SpO2 96 %.   Intake/Output Summary (Last 24 hours) at 11/27/15 1330 Last data filed at 11/27/15 1311  Gross per 24 hour  Intake    600 ml  Output   1750 ml  Net  -1150 ml    Weight trends: Filed Weights   11/26/15 1649 11/26/15 2236  Weight: 124.739 kg (275 lb) 120.385 kg (265 lb 6.4 oz)    Physical Exam: General:  obese lady, laying in the bed, no acute distress   HEENT  anicteric sclera, round pupils, moist oral mucous membranes   Neck:  supple, no masses   Lungs:  decreased breath sounds at bases, mild scattered wheezing, normal history effort   Heart::  tachycardic, regular, no rub or gallop   Abdomen:  soft, distended, nontender   Extremities:  3+ generalized edema   Neurologic:  alert, oriented, speech normal   Skin:  no acute rashes   Access:   Foley:        Lab results: Basic Metabolic Panel:  Recent Labs Lab 11/26/15 1834 11/27/15 0546  NA 144 147*  K 2.6* 2.9*  CL 99* 103  CO2 32 36*  GLUCOSE 678* 386*  BUN 24* 21*  CREATININE 1.19* 1.03*  CALCIUM 9.1 8.8*  MG 2.1  --     Liver Function Tests:  Recent  Labs Lab 11/26/15 1834  AST 48*  ALT 72*  ALKPHOS 238*  BILITOT 1.3*  PROT 6.1*  ALBUMIN 3.2*   No results for input(s): LIPASE, AMYLASE in the last 168 hours. No results for input(s): AMMONIA in the last 168 hours.  CBC:  Recent Labs Lab 11/26/15 1834 11/27/15 0546  WBC 6.9 6.6  HGB 9.6* 8.7*  HCT 31.5* 28.0*  MCV 75.6* 74.9*  PLT 105* 97*    Cardiac Enzymes:  Recent Labs Lab 11/26/15 1834  TROPONINI <0.03    BNP: Invalid input(s): POCBNP  CBG:  Recent Labs Lab 11/26/15 2238 11/27/15 0113 11/27/15 0235 11/27/15 0738 11/27/15 1109  GLUCAP 471* 498* 440* 350* 393*    Microbiology: No results found for this or any previous visit (from the past 720 hour(s)).   Coagulation Studies: No results for input(s): LABPROT, INR in the last 72 hours.  Urinalysis:  Recent Labs  11/26/15 1834  COLORURINE STRAW*  LABSPEC 1.021  PHURINE 7.0  GLUCOSEU >500*  HGBUR 1+*  BILIRUBINUR NEGATIVE  KETONESUR TRACE*  PROTEINUR NEGATIVE  NITRITE NEGATIVE  LEUKOCYTESUR NEGATIVE        Imaging: Ct Pelvis W Contrast  11/27/2015  CLINICAL DATA:  Hepatic mass discovered on ultrasound. Concern for hepatic malignancy. EXAM: CT ABDOMEN WITHOUT AND WITH CONTRAST CT pelvis with contrast. TECHNIQUE: Multidetector CT imaging of the abdomen was performed following the standard protocol before and following the bolus  administration of intravenous contrast. CT the pelvis performed following IV contrast. CONTRAST:  141m OMNIPAQUE IOHEXOL 350 MG/ML SOLN COMPARISON:  Ultrasound 11/27/2015, radiograph 11/26/2015 FINDINGS: Lower chest: Multiple round pulmonary nodules at the lung bases of varying size consistent with lung metastasis. Example lesion in the RIGHT lower lobe measures 10 mm image 10, series 3. Example lesion LEFT lower lobe measures 11 mm on image 4, series 3. Hepatobiliary: Large RIGHT hepatic lobe mass occupying the near entirety of the posterior RIGHT hepatic lobe  (segments 5, 6 and 7). Mass measures 12 cm x 12 cm in axial dimension and 17 cm in craniocaudad dimension. RIGHT hepatic lobe mass is partially exophytic and extends inferiorly and compresses the RIGHT kidney and extend into the space between the RIGHT kidney and the IVC. Several nodular extension this at this level (image 112, series 8). There is a smaller lesion in the LEFT hepatic lobe measuring 2.2 cm on image 48, series 8. The LEFT portal vein is patent. 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 Main portal vein is patent. Pancreas: Pancreas is normal. No ductal dilatation. No pancreatic inflammation. Spleen: Normal spleen Adrenals/urinary tract: The the RIGHT adrenal gland is not identified secondary to the hepatic mass. The RIGHT kidney is depressed inferiorly. The LEFT kidney adrenal gland appear normal. Stomach/Bowel: Stomach, small bowel, appendix, and cecum are normal. The colon and rectosigmoid colon are normal. Vascular/Lymphatic: No clear lymphadenopathy. Nodule lesion between the IVC in the RIGHT kidney on image 112 is felt to be extension of the hepatic tumor itself. No pelvic lymphadenopathy. Reproductive: The fundus of the uterus has enhancing mass measuring 7.8 x 9.2 by 6.7 cm. No adnexal abnormality is obvious. Other: No free fluid. Musculoskeletal: There is an aggressive lesion along the anterior margin of the RIGHT iliac bone. This lesion or erupts through the cortex and measures 4.0 by 4.6 cm on image 164, series 8. The sclerosis of the SI joints. IMPRESSION: 1. Large RIGHT hepatic lobe mass which extends inferior to the pararenal space on the RIGHT. 2. 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 LEFT hepatic lobe lesion additionally. 3. Bilateral lower lobe pulmonary metastasis. 4. 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 5. Enhancing mass in the uterine fundus is likely a benign leiomyoma. Cannot exclude leiomyosarcoma given the extensive metastatic disease in no clear  primary. 6. Differential considerations would include primary hepatocellular carcinoma versus metastatic disease from unknown primary. Recommend biopsying liver mass and FDG PET scan. Electronically Signed   By: SSuzy BouchardM.D.   On: 11/27/2015 14:21   Ct Abd Wo & W Cm  11/27/2015  CLINICAL DATA:  Hepatic mass discovered on ultrasound. Concern for hepatic malignancy. EXAM: CT ABDOMEN WITHOUT AND WITH CONTRAST CT pelvis with contrast. TECHNIQUE: Multidetector CT imaging of the abdomen was performed following the standard protocol before and following the bolus administration of intravenous contrast. CT the pelvis performed following IV contrast. CONTRAST:  1025mOMNIPAQUE IOHEXOL 350 MG/ML SOLN COMPARISON:  Ultrasound 11/27/2015, radiograph 11/26/2015 FINDINGS: Lower chest: Multiple round pulmonary nodules at the lung bases of varying size consistent with lung metastasis. Example lesion in the RIGHT lower lobe measures 10 mm image 10, series 3. Example lesion LEFT lower lobe measures 11 mm on image 4, series 3. Hepatobiliary: Large RIGHT hepatic lobe mass occupying the near entirety of the posterior RIGHT hepatic lobe (segments 5, 6 and 7). Mass measures 12 cm x 12 cm in axial dimension and 17 cm in craniocaudad dimension. RIGHT hepatic lobe  mass is partially exophytic and extends inferiorly and compresses the RIGHT kidney and extend into the space between the RIGHT kidney and the IVC. Several nodular extension this at this level (image 112, series 8). There is a smaller lesion in the LEFT hepatic lobe measuring 2.2 cm on image 48, series 8. The LEFT portal vein is patent. 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 Main portal vein is patent. Pancreas: Pancreas is normal. No ductal dilatation. No pancreatic inflammation. Spleen: Normal spleen Adrenals/urinary tract: The the RIGHT adrenal gland is not identified secondary to the hepatic mass. The RIGHT kidney is depressed inferiorly. The LEFT kidney adrenal gland  appear normal. Stomach/Bowel: Stomach, small bowel, appendix, and cecum are normal. The colon and rectosigmoid colon are normal. Vascular/Lymphatic: No clear lymphadenopathy. Nodule lesion between the IVC in the RIGHT kidney on image 112 is felt to be extension of the hepatic tumor itself. No pelvic lymphadenopathy. Reproductive: The fundus of the uterus has enhancing mass measuring 7.8 x 9.2 by 6.7 cm. No adnexal abnormality is obvious. Other: No free fluid. Musculoskeletal: There is an aggressive lesion along the anterior margin of the RIGHT iliac bone. This lesion or erupts through the cortex and measures 4.0 by 4.6 cm on image 164, series 8. The sclerosis of the SI joints. IMPRESSION: 1. Large RIGHT hepatic lobe mass which extends inferior to the pararenal space on the RIGHT. 2. 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 LEFT hepatic lobe lesion additionally. 3. Bilateral lower lobe pulmonary metastasis. 4. 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 5. Enhancing mass in the uterine fundus is likely a benign leiomyoma. Cannot exclude leiomyosarcoma given the extensive metastatic disease in no clear primary. 6. Differential considerations would include primary hepatocellular carcinoma versus metastatic disease from unknown primary. Recommend biopsying liver mass and FDG PET scan. Electronically Signed   By: Suzy Bouchard M.D.   On: 11/27/2015 14:21   Dg Chest Portable 1 View  11/26/2015  CLINICAL DATA:  59 year old with progressively worsening shortness of breath and bilateral upper extremity and lower extremity edema over the past few days. EXAM: PORTABLE CHEST 1 VIEW COMPARISON:  06/03/2014. FINDINGS: Marked elevation of the right hemidiaphragm with scar/atelectasis at the right lung base. Cardiac silhouette mildly to moderately enlarged. Pulmonary venous hypertension without overt edema. No visible pleural effusions. IMPRESSION: 1. Cardiomegaly. Pulmonary venous hypertension without  overt edema currently. 2. Elevation of the right hemidiaphragm with scar/atelectasis at the right lung base. Electronically Signed   By: Evangeline Dakin M.D.   On: 11/26/2015 17:56   Dg Hand Complete Left  11/26/2015  CLINICAL DATA:  Fall onto left hand. Left hand swelling and pain. Initial encounter. EXAM: LEFT HAND - COMPLETE 3+ VIEW COMPARISON:  None. FINDINGS: There is no evidence of fracture or dislocation. Mild degenerative spurring is seen involving the metacarpophalangeal joint of the thumb. No other bone abnormality identified. Marked soft tissue swelling is seen involving the dorsum of the hand and the wrist. IMPRESSION: Marked hand and wrist soft tissue swelling. No evidence of fracture or dislocation. Electronically Signed   By: Earle Gell M.D.   On: 11/26/2015 20:04   US Abdomen Limited Ruq  11/27/2015  CLINICAL DATA:  Elevated liver enzymes EXAM: US ABDOMEN LIMITED - RIGHT UPPER QUADRANT COMPARISON:  None. FINDINGS: Gallbladder: The gallbladder is predominantly contracted. No gallbladder wall thickening or pericholecystic fluid. Negative sonographic Murphy's sign per Common bile duct: Diameter: Normal at 4 mm. Liver: There is a large round heterogeneous solid-appearing mass  within the RIGHT hepatic lobe measuring 18 by 11 by 12. This mass bulges the liver capsule. Additional smaller lesions noted in the LEFT hepatic lobe measuring up to 3.5 cm. No biliary duct dilatation. Mild heterogeneity of the liver parenchyma. IMPRESSION: 1. 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 Recommend CT of the abdomen and pelvis with contrast for further evaluation. 2. Contracted gallbladder without evidence of cholecystitis. These results will be called to the ordering clinician or representative by the Radiologist Assistant, and communication documented in the PACS or zVision Dashboard. Electronically Signed   By: Suzy Bouchard M.D.   On: 11/27/2015 09:35     Assessment & Plan: Pt is a 59 y.o. yo female with a PMHX of long-standing hypertension, hip arthritis, stroke, with residual left-sided weakness, was admitted on 11/26/2015 with worsening generalized edema, dyspnea on exertion, found to have hypokalemia upon admission. Ultrasound of the abdomen shows large mass in right hepatic lobe, concerning for hepatic carcinoma  1. Hypernatremia 2. Hypokalemia 3. Generalized edema 4. Glucosuria, hyperglycemia 5. Thrombocytopenia 6. Liver mass by ultrasound, CT of the abdomen report pending 7. Anemia, unspecified 8. Severe hypertension  Patient presents as an interesting case. She has been having worsening of generalized edema for the last few months now. She has incidental finding of right hepatic mass concerning for hepatic carcinoma. She also has incidental findings of glucosuria, hematuria and hyperglycemia. She has thrombocytopenia and anemia which go along liver disease. She has severe hypokalemia. Potassium is being replaced aggressively at present.  Plan: Will obtain urine protein/creatinine ratio Will need workup for diabetes including hemoglobin A1c Would like to obtain aldosterone/training ratio but patient has been on spironolactone therefore results can be skewed. Defer this evaluation for now. Would like to take patient off of vasodilators such as hydralazine. Dose of clonidine  also eventually needs to be reduced. Instead of lisinopril, would like to get patient started on ARB. Irbesartan as available on [place] Would avoid nephrotoxins such as nonsteroidals and IV contrast if possible

## 2015-11-27 NOTE — Progress Notes (Signed)
MD Komatke notified of positive CT abd scan. No further orders at this time.

## 2015-11-27 NOTE — Consult Note (Addendum)
[place] @ [facility] [phone]  [phone]     Renee Wells OB: Apr 26, 1956  MR#: [id number]  AOZ#:[id number]  Patient Care Team: Sharyne Peach, MD as PCP - General (Family Medicine) Requesting provider-Dr. Leslye Peer CHIEF COMPLAINT:  Chief Complaint  Patient presents with  . Shortness of Breath   liver mass   No history exists.    Oncology Flowsheet 06/26/2015 06/26/2015 06/27/2015  enoxaparin (LOVENOX) Cade 40 mg 40 mg 40 mg    HISTORY OF PRESENT ILLNESS:   Renee Wells is a 59 year old African-American lady, who is admitted due to worsening fluid retention and hypokalemia, resistant hypertension and newly found hyperglycemia. During the workup she was found to have mild elevation in total bilirubin and ALT/AST, so the ultrasound of the liver was performed, which revealed the presence of a very large mass in the right lobe, as well as smaller lesions in the left lobe. Patient does not have history of toxic exposures, alcohol use (last time she drank beer was 35 years ago), denies hepatitis, liver cirrhosis. Her primary complaint is fluid retention, shortness of breath and hypokalemia. During this admission she was found to have extremely high levels of blood glucose, which is new to her. She denies abdominal pain, nausea, vomiting, diarrhea, constipation, change in the color of stool, weight loss, cough, chest pain, night sweats.  REVIEW OF SYSTEMS:   Review of Systems  All other systems reviewed and are negative.    PAST MEDICAL HISTORY: Past Medical History  Diagnosis Date  . Stroke ([place])   . Hypertension   . HLD (hyperlipidemia)   . Obesity     PAST SURGICAL HISTORY: Past Surgical History  Procedure Laterality Date  . Cesarean section      FAMILY HISTORY Family History  Problem Relation Age of Onset  . Heart disease Mother   . Hypertension Mother   . Diabetes Mother   . Diabetes Sister   . Hyperlipidemia Sister     ADVANCED DIRECTIVES:    No flowsheet data found.  HEALTH MAINTENANCE: Social History  Substance Use Topics  . Smoking status: Never Smoker   . Smokeless tobacco: None  . Alcohol Use: No     Comment: occasional     Allergies  Allergen Reactions  . Penicillins Itching    Current Facility-Administered Medications  Medication Dose Route Frequency Provider Last Rate Last Dose  . acetaminophen (TYLENOL) tablet 650 mg  650 mg Oral Q6H PRN Aldean Jewett, MD       Or  . acetaminophen (TYLENOL) suppository 650 mg  650 mg Rectal Q6H PRN Aldean Jewett, MD      . antiseptic oral rinse (CPC / CETYLPYRIDINIUM CHLORIDE 0.05%) solution 7 mL  7 mL Mouth Rinse BID Aldean Jewett, MD   7 mL at 11/27/15 0030  . aspirin EC tablet 325 mg  325 mg Oral Daily Aldean Jewett, MD   325 mg at 11/27/15 0912  . cloNIDine (CATAPRES) tablet 0.3 mg  0.3 mg Oral BID Aldean Jewett, MD   0.3 mg at 11/27/15 9629  . heparin injection 5,000 Units  5,000 Units Subcutaneous 3 times per day Aldean Jewett, MD   5,000 Units at 11/27/15 5284  . hydrALAZINE (APRESOLINE) injection 10 mg  10 mg Intravenous Q6H PRN Aldean Jewett, MD   10 mg at 11/26/15 2112  . hydrALAZINE (APRESOLINE) tablet 100 mg  100 mg Oral TID Aldean Jewett, MD   100 mg at  11/27/15 0912  . insulin aspart (novoLOG) injection 0-15 Units  0-15 Units Subcutaneous TID AC & HS Lance Coon, MD   11 Units at 11/27/15 0914  . iohexol (OMNIPAQUE) 240 MG/ML injection 50 mL  50 mL Oral Q1 Hr x 2 Loletha Grayer, MD      . isosorbide mononitrate (IMDUR) 24 hr tablet 60 mg  60 mg Oral Daily Aldean Jewett, MD   60 mg at 11/27/15 0912  . labetalol (NORMODYNE) tablet 200 mg  200 mg Oral BID Aldean Jewett, MD   200 mg at 11/27/15 0912  . ondansetron (ZOFRAN) tablet 4 mg  4 mg Oral Q6H PRN Aldean Jewett, MD       Or  . ondansetron [facility]) injection 4 mg  4 mg Intravenous Q6H PRN Aldean Jewett, MD      . oxyCODONE-acetaminophen (PERCOCET/ROXICET) 5-325  MG per tablet 1 tablet  1 tablet Oral Q4H PRN Aldean Jewett, MD      . polyethylene glycol (MIRALAX / GLYCOLAX) packet 17 g  17 g Oral Daily PRN Aldean Jewett, MD      . potassium chloride SA (K-DUR,KLOR-CON) CR tablet 40 mEq  40 mEq Oral BID Loletha Grayer, MD   40 mEq at 11/27/15 1000  . sodium chloride 0.9 % injection 3 mL  3 mL Intravenous Q12H Aldean Jewett, MD   3 mL at 11/27/15 1000    OBJECTIVE:  Filed Vitals:   11/27/15 0744 11/27/15 1109  BP: 149/75 132/68  Pulse: 88 80  Temp:    Resp: 20 20     Body mass index is 44.16 kg/(m^2).    ECOG FS:3 - Symptomatic, >50% confined to bed  Physical Exam  Constitutional: She is oriented to person, place, and time. She appears distressed (minimal respiratory distress, on oxygen supplementation).  Morbidly obese African-American female  HENT:  Head: Normocephalic and atraumatic.  Right Ear: External ear normal.  Left Ear: External ear normal.  Mouth/Throat: Oropharynx is clear and moist.  Eyes: Conjunctivae are normal. Pupils are equal, round, and reactive to light. Right eye exhibits no discharge. Left eye exhibits no discharge. No scleral icterus.  Neck: Normal range of motion. Neck supple. No JVD present. No tracheal deviation present. No thyromegaly present.  Cardiovascular: Normal rate, regular rhythm, normal heart sounds and intact distal pulses.  Exam reveals no gallop and no friction rub.   No murmur heard. Pulmonary/Chest: Effort normal and breath sounds normal. No stridor. No respiratory distress. She has no wheezes. She has no rales. She exhibits no mass, no tenderness, no bony tenderness and no crepitus. Right breast exhibits no inverted nipple, no mass, no nipple discharge, no skin change and no tenderness. Left breast exhibits no inverted nipple, no mass, no nipple discharge, no skin change and no tenderness. Breasts are symmetrical.    Abdominal: Soft. Bowel sounds are normal. She exhibits no distension and no  mass. There is no tenderness. There is no rebound and no guarding.  Genitourinary:  Postponed  Musculoskeletal: Normal range of motion. She exhibits no edema or tenderness.  Lymphadenopathy:    She has no cervical adenopathy.  Neurological: She is alert and oriented to person, place, and time. She has normal reflexes. No cranial nerve deficit. She exhibits normal muscle tone. Gait normal. Coordination normal. GCS score is 15.  Skin: Skin is warm. No rash noted. She is not diaphoretic. No erythema. No pallor.  There is large ecchymosis over the right breast  Psychiatric: Mood, memory, affect and judgment normal.  Nursing note and vitals reviewed.    LAB RESULTS:  CBC Latest Ref Rng 11/27/2015 11/26/2015  WBC 3.6 - 11.0 K/uL 6.6 6.9  Hemoglobin 12.0 - 16.0 g/dL 8.7(L) 9.6(L)  Hematocrit 35.0 - 47.0 % 28.0(L) 31.5(L)  Platelets 150 - 440 K/uL 97(L) 105(L)    Admission on 11/26/2015  Component Date Value Ref Range Status  . WBC 11/26/2015 6.9  3.6 - 11.0 K/uL Final  . RBC 11/26/2015 4.17  3.80 - 5.20 MIL/uL Final  . Hemoglobin 11/26/2015 9.6* 12.0 - 16.0 g/dL Final  . HCT 11/26/2015 31.5* 35.0 - 47.0 % Final  . MCV 11/26/2015 75.6* 80.0 - 100.0 fL Final  . MCH 11/26/2015 23.1* 26.0 - 34.0 pg Final  . MCHC 11/26/2015 30.5* 32.0 - 36.0 g/dL Final  . RDW 11/26/2015 19.0* 11.5 - 14.5 % Final  . Platelets 11/26/2015 105* 150 - 440 K/uL Final  . Sodium 11/26/2015 144  135 - 145 mmol/L Final  . Potassium 11/26/2015 2.6* 3.5 - 5.1 mmol/L Final   Comment: CRITICAL RESULT CALLED TO, READ BACK BY AND VERIFIED WITH ANDREA BRYANT AT 1937 ON 11/26/15 RWW   . Chloride 11/26/2015 99* 101 - 111 mmol/L Final  . CO2 11/26/2015 32  22 - 32 mmol/L Final  . Glucose, Bld 11/26/2015 678* 65 - 99 mg/dL Final   Comment: CRITICAL RESULT CALLED TO, READ BACK BY AND VERIFIED WITH ANDREA BRYANT AT 1937 ON 11/26/15 RWW   . BUN 11/26/2015 24* 6 - 20 mg/dL Final  . Creatinine, Ser 11/26/2015 1.19* 0.44 - 1.00  mg/dL Final  . Calcium 11/26/2015 9.1  8.9 - 10.3 mg/dL Final  . Total Protein 11/26/2015 6.1* 6.5 - 8.1 g/dL Final  . Albumin 11/26/2015 3.2* 3.5 - 5.0 g/dL Final  . AST 11/26/2015 48* 15 - 41 U/L Final  . ALT 11/26/2015 72* 14 - 54 U/L Final  . Alkaline Phosphatase 11/26/2015 238* 38 - 126 U/L Final  . Total Bilirubin 11/26/2015 1.3* 0.3 - 1.2 mg/dL Final  . GFR calc non Af Amer 11/26/2015 49* >60 mL/min Final  . GFR calc Af Amer 11/26/2015 56* >60 mL/min Final   Comment: (NOTE) The eGFR has been calculated using the CKD EPI equation. This calculation has not been validated in all clinical situations. eGFR's persistently <60 mL/min signify possible Chronic Kidney Disease.   . Anion gap 11/26/2015 13  5 - 15 Final  . Magnesium 11/26/2015 2.1  1.7 - 2.4 mg/dL Final  . B Natriuretic Peptide 11/25/2015 146.0* 0.0 - 100.0 pg/mL Final  . Troponin I 11/26/2015 <0.03  <0.031 ng/mL Final   Comment:        NO INDICATION OF MYOCARDIAL INJURY.   . Color, Urine 11/26/2015 STRAW* YELLOW Final  . APPearance 11/26/2015 CLEAR* CLEAR Final  . Glucose, UA 11/26/2015 >500* NEGATIVE mg/dL Final  . Bilirubin Urine 11/26/2015 NEGATIVE  NEGATIVE Final  . Ketones, ur 11/26/2015 TRACE* NEGATIVE mg/dL Final  . Specific Gravity, Urine 11/26/2015 1.021  1.005 - 1.030 Final  . Hgb urine dipstick 11/26/2015 1+* NEGATIVE Final  . pH 11/26/2015 7.0  5.0 - 8.0 Final  . Protein, ur 11/26/2015 NEGATIVE  NEGATIVE mg/dL Final  . Nitrite 11/26/2015 NEGATIVE  NEGATIVE Final  . Leukocytes, UA 11/26/2015 NEGATIVE  NEGATIVE Final  . RBC / HPF 11/26/2015 0-5  0 - 5 RBC/hpf Final  . WBC, UA 11/26/2015 0-5  0 - 5 WBC/hpf Final  . Bacteria, UA 11/26/2015  RARE* NONE SEEN Final  . Squamous Epithelial / LPF 11/26/2015 0-5* NONE SEEN Final  . TSH 11/25/2015 1.031  0.350 - 4.500 uIU/mL Final  . WBC 11/27/2015 6.6  3.6 - 11.0 K/uL Final  . RBC 11/27/2015 3.74* 3.80 - 5.20 MIL/uL Final  . Hemoglobin 11/27/2015 8.7* 12.0 -  16.0 g/dL Final  . HCT 11/27/2015 28.0* 35.0 - 47.0 % Final  . MCV 11/27/2015 74.9* 80.0 - 100.0 fL Final  . MCH 11/27/2015 23.3* 26.0 - 34.0 pg Final  . MCHC 11/27/2015 31.2* 32.0 - 36.0 g/dL Final  . RDW 11/27/2015 18.7* 11.5 - 14.5 % Final  . Platelets 11/27/2015 97* 150 - 440 K/uL Final  . Sodium 11/27/2015 147* 135 - 145 mmol/L Final  . Potassium 11/27/2015 2.9* 3.5 - 5.1 mmol/L Final   Comment: CRITICAL RESULT CALLED TO, READ BACK BY AND VERIFIED WITH  DOLL FERGUSON AT 3151 11/27/15 SDR   . Chloride 11/27/2015 103  101 - 111 mmol/L Final  . CO2 11/27/2015 36* 22 - 32 mmol/L Final  . Glucose, Bld 11/27/2015 386* 65 - 99 mg/dL Final  . BUN 11/27/2015 21* 6 - 20 mg/dL Final  . Creatinine, Ser 11/27/2015 1.03* 0.44 - 1.00 mg/dL Final  . Calcium 11/27/2015 8.8* 8.9 - 10.3 mg/dL Final  . GFR calc non Af Amer 11/27/2015 58* >60 mL/min Final  . GFR calc Af Amer 11/27/2015 >60  >60 mL/min Final   Comment: (NOTE) The eGFR has been calculated using the CKD EPI equation. This calculation has not been validated in all clinical situations. eGFR's persistently <60 mL/min signify possible Chronic Kidney Disease.   . Anion gap 11/27/2015 8  5 - 15 Final  . Folate 11/27/2015 9.2  >5.9 ng/mL Final  . Iron 11/27/2015 48  28 - 170 ug/dL Final  . TIBC 11/27/2015 231* 250 - 450 ug/dL Final  . Saturation Ratios 11/27/2015 21  10.4 - 31.8 % Final  . UIBC 11/27/2015 183   Final  . Ferritin 11/27/2015 112  11 - 307 ng/mL Final  . Retic Ct Pct 11/27/2015 3.8* 0.4 - 3.1 % Final  . RBC. 11/27/2015 3.73* 3.80 - 5.20 MIL/uL Final  . Retic Count, Manual 11/27/2015 141.7  19.0 - 183.0 K/uL Final  . Glucose-Capillary 11/26/2015 471* 65 - 99 mg/dL Final  . Comment 1 11/26/2015 Notify RN   Final  . Glucose-Capillary 11/26/2015 524* 65 - 99 mg/dL Final  . Comment 1 11/26/2015 Notify RN   Final  . Comment 2 11/26/2015 Document in Chart   Final  . Glucose-Capillary 11/27/2015 498* 65 - 99 mg/dL Final  .  Glucose-Capillary 11/27/2015 440* 65 - 99 mg/dL Final  . Glucose-Capillary 11/27/2015 350* 65 - 99 mg/dL Final  . Comment 1 11/27/2015 Notify RN   Final       STUDIES: Dg Chest Portable 1 View  11/26/2015  CLINICAL DATA:  59 year old with progressively worsening shortness of breath and bilateral upper extremity and lower extremity edema over the past few days. EXAM: PORTABLE CHEST 1 VIEW COMPARISON:  06/03/2014. FINDINGS: Marked elevation of the right hemidiaphragm with scar/atelectasis at the right lung base. Cardiac silhouette mildly to moderately enlarged. Pulmonary venous hypertension without overt edema. No visible pleural effusions. IMPRESSION: 1. Cardiomegaly. Pulmonary venous hypertension without overt edema currently. 2. Elevation of the right hemidiaphragm with scar/atelectasis at the right lung base. Electronically Signed   By: Evangeline Dakin M.D.   On: 11/26/2015 17:56   Dg Hand Complete Left  11/26/2015  CLINICAL DATA:  Fall onto left hand. Left hand swelling and pain. Initial encounter. EXAM: LEFT HAND - COMPLETE 3+ VIEW COMPARISON:  None. FINDINGS: There is no evidence of fracture or dislocation. Mild degenerative spurring is seen involving the metacarpophalangeal joint of the thumb. No other bone abnormality identified. Marked soft tissue swelling is seen involving the dorsum of the hand and the wrist. IMPRESSION: Marked hand and wrist soft tissue swelling. No evidence of fracture or dislocation. Electronically Signed   By: Earle Gell M.D.   On: 11/26/2015 20:04   Dg Hip Unilat  With Pelvis 2-3 Views Right  11/06/2015  CLINICAL DATA:  Chronic hip pain exacerbated by activity. Pain for 2 weeks. EXAM: DG HIP (WITH OR WITHOUT PELVIS) 2-3V RIGHT COMPARISON:  None. FINDINGS: The cortical margins of the bony pelvis are intact. No fracture. Pubic symphysis and sacroiliac joints are congruent. Both femoral heads are well-seated in the respective acetabula. Mild-to-moderate degenerative  change of both hips with acetabular osteophytes. 6.7 cm elongated ossified density adjacent to the right anterior superior iliac spine is consistent with an avulsion injury, of indeterminate age. IMPRESSION: 1. Age-indeterminate avulsion injury from the right anterior superior iliac spine. 2. 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 Electronically Signed   By: Jeb Levering M.D.   On: 11/06/2015 02:00   US Abdomen Limited Ruq  11/27/2015  CLINICAL DATA:  Elevated liver enzymes EXAM: US ABDOMEN LIMITED - RIGHT UPPER QUADRANT COMPARISON:  None. FINDINGS: Gallbladder: The gallbladder is predominantly contracted. No gallbladder wall thickening or pericholecystic fluid. Negative sonographic Murphy's sign per Common bile duct: Diameter: Normal at 4 mm. Liver: There is a large round heterogeneous solid-appearing mass within the RIGHT hepatic lobe measuring 18 by 11 by 12. This mass bulges the liver capsule. Additional smaller lesions noted in the LEFT hepatic lobe measuring up to 3.5 cm. No biliary duct dilatation. Mild heterogeneity of the liver parenchyma. IMPRESSION: 1. 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 Recommend CT of the abdomen and pelvis with contrast for further evaluation. 2. Contracted gallbladder without evidence of cholecystitis. These results will be called to the ordering clinician or representative by the Radiologist Assistant, and communication documented in the PACS or zVision Dashboard. Electronically Signed   By: Suzy Bouchard M.D.   On: 11/27/2015 09:35    ASSESSMENT AND MEDICAL DECISION MAKING:  Multiple intrahepatic lesions-appear concerning for primary hepatocellular carcinoma. Patient will need CT of the abdomen and pelvis with liver protocol (3 phasic). I would also recommend to include CT chest with contrast, looking for signs of metastatic disease/primary lung lesion. Would  recommend checking AFB, hepatitis profile, PT and INR. Physical exam does not suggest any breast masses, but the patient has not had mammogram for at least 10 years, so screening mammogram is reasonable. Also, patient has not had colonoscopy yet, so checking CEA is a reasonable option as well, if elevated, additional evaluation, including colonoscopy can be undertaken. I also personally reviewed ultrasound images and available blood work.  Resistant hypertension, hyper-glycemia and hypokalemia-concern about Cushing's disease/hyperaldosteronism-CT of the abdomen and pelvis could be useful in assessing adrenal glands (adrenal cortical adenoma or carcinoma).   Microcytic anemia-I doubt that it is iron deficiency anemia, since her MCV has been low for the past 3 years, when her hemoglobin was normal. Her iron studies are not consistent with iron deficiency, likely anemia is due to chronic illness. We should  perform hemoglobin electrophoresis to look for thalassemia trait.  Case was discussed with Dr. Leslye Peer We will follow tomorrow. No matching staging information was found for the patient.  Roxana Hires, MD   11/27/2015 11:10 AM

## 2015-11-27 NOTE — Progress Notes (Signed)
Patient's CBG one hour after receiving 10 units of insulin 498. Notified MD and given orders to give another 10 units. Will recheck in one hour after administration and notify MD as needed. Nursing staff will continue to monitor. Earleen Reaper, RN

## 2015-11-27 NOTE — Progress Notes (Signed)
PT Cancellation Note  Patient Details Name: Renee Wells MRN: [id number] DOB: May 03, 1956   Cancelled Treatment:    Reason Eval/Treat Not Completed: Medical issues which prohibited therapy. She had decreased Potassium level 2.9 and elevated glucose 393.  Alanson Puls, PT, DPT [place], [place] S 11/27/2015, 3:34 PM

## 2015-11-28 LAB — BASIC METABOLIC PANEL
ANION GAP: 7 (ref 5–15)
BUN: 19 mg/dL (ref 6–20)
CALCIUM: 8.8 mg/dL — AB (ref 8.9–10.3)
CHLORIDE: 103 mmol/L (ref 101–111)
CO2: 31 mmol/L (ref 22–32)
CREATININE: 0.97 mg/dL (ref 0.44–1.00)
GFR calc non Af Amer: >60 mL/min (ref >60)
Glucose, Bld: 313 mg/dL — ABNORMAL HIGH (ref 65–99)
Potassium: 4.6 mmol/L (ref 3.5–5.1)
SODIUM: 141 mmol/L (ref 135–145)

## 2015-11-28 LAB — HCV COMMENT:

## 2015-11-28 LAB — C4 COMPLEMENT: COMPLEMENT C4, BODY FLUID: 30 mg/dL (ref 14–44)

## 2015-11-28 LAB — GLUCOSE, CAPILLARY
GLUCOSE-CAPILLARY: 308 mg/dL — AB (ref 65–99)
GLUCOSE-CAPILLARY: 356 mg/dL — AB (ref 65–99)
Glucose-Capillary: 364 mg/dL — ABNORMAL HIGH (ref 65–99)
Glucose-Capillary: 289 mg/dL — ABNORMAL HIGH (ref 65–99)

## 2015-11-28 LAB — VITAMIN B12: Vitamin B-12: 511 pg/mL (ref 180–914)

## 2015-11-28 LAB — C3 COMPLEMENT: C3 COMPLEMENT: 109 mg/dL (ref 82–167)

## 2015-11-28 LAB — PROTIME-INR
INR: 1.05
PROTHROMBIN TIME: 13.9 s (ref 11.4–15.0)

## 2015-11-28 LAB — CORTISOL-AM, BLOOD: Cortisol - AM: 39.5 ug/dL — ABNORMAL HIGH (ref 6.7–22.6)

## 2015-11-28 LAB — CORTISOL-PM, BLOOD: CORTISOL - PM: 44.8 ug/dL — AB (ref <10.0)

## 2015-11-28 LAB — HEPATITIS C ANTIBODY (REFLEX): HCV Ab: <0.1 s/co ratio (ref 0.0–0.9)

## 2015-11-28 MED ORDER — POTASSIUM CHLORIDE CRYS ER 20 MEQ PO TBCR
20.0000 meq | EXTENDED_RELEASE_TABLET | Freq: Two times a day (BID) | ORAL | Status: DC
  Administered 2015-11-28 – 2015-11-30 (×5): 20 meq via ORAL
  Filled 2015-11-28 (×5): qty 1

## 2015-11-28 MED ORDER — FUROSEMIDE 10 MG/ML IJ SOLN
40.0000 mg | Freq: Once | INTRAMUSCULAR | Status: AC
  Administered 2015-11-28: 40 mg via INTRAVENOUS
  Filled 2015-11-28: qty 4

## 2015-11-28 MED ORDER — POTASSIUM CHLORIDE CRYS ER 20 MEQ PO TBCR
20.0000 meq | EXTENDED_RELEASE_TABLET | Freq: Every day | ORAL | Status: DC
  Administered 2015-11-28: 20 meq via ORAL
  Filled 2015-11-28: qty 1

## 2015-11-28 MED ORDER — INSULIN GLARGINE 100 UNIT/ML ~~LOC~~ SOLN
25.0000 [IU] | Freq: Every day | SUBCUTANEOUS | Status: DC
  Administered 2015-11-28: 25 [IU] via SUBCUTANEOUS
  Filled 2015-11-28 (×2): qty 0.25

## 2015-11-28 NOTE — Progress Notes (Signed)
Subjective:  Doing about the same as yesterday. CT of the abdomen shows liver mass, occlusion of the right portal vein, bilateral lower lobe pulmonary metastasis, skeletal metastasis Oncology workup is in progress Patient continues to have massive volume overload. She is continued on IV Lasix twice a day Her blood pressure remains high. She is currently on clonidine 0.3 twice a day, hydralazine, Avapro, Imdur, labetalol No complaints of acute shortness of breath   Objective:  Vital signs in last 24 hours:  Temp:  [97.8 F (36.6 C)-98.6 F (37 C)] 97.8 F (36.6 C) (12/11 1133) Pulse Rate:  [83-88] 88 (12/11 1133) Resp:  [18-22] 22 (12/11 1133) BP: (162-167)/(84-86) 167/84 mmHg (12/11 1133) SpO2:  [93 %-100 %] 93 % (12/11 1133) Weight:  [121.745 kg (268 lb 6.4 oz)] 121.745 kg (268 lb 6.4 oz) (12/11 0458)  Weight change: -2.994 kg (-6 lb 9.6 oz) Filed Weights   11/26/15 1649 11/26/15 2236 11/28/15 0458  Weight: 124.739 kg (275 lb) 120.385 kg (265 lb 6.4 oz) 121.745 kg (268 lb 6.4 oz)    Intake/Output:    Intake/Output Summary (Last 24 hours) at 11/28/15 1218 Last data filed at 11/28/15 1002  Gross per 24 hour  Intake    600 ml  Output    900 ml  Net   -300 ml     Physical Exam: General:  obese lady, laying in the bed   HEENT  moist oral mucous membranes   Neck  supple no masses   Pulm/lungs  normal respiratory effort, scattered rhonchi otherwise clear   CVS/Heart  regular rate and rhythm, no rub or gallop   Abdomen:   soft, distended, nontender   Extremities:  3+ pitting edema, anasarca   Neurologic:  alert, oriented, speech normal   Skin:  no acute rashes   Access:        Basic Metabolic Panel:   Recent Labs Lab 11/26/15 1834 11/27/15 0546 11/27/15 1519 11/28/15 0715  NA 144 147*  --  141  K 2.6* 2.9* 3.8 4.6  CL 99* 103  --  103  CO2 32 36*  --  31  GLUCOSE 678* 386*  --  313*  BUN 24* 21*  --  19  CREATININE 1.19* 1.03*  --  0.97  CALCIUM 9.1 8.8*   --  8.8*  MG 2.1  --   --   --      CBC:  Recent Labs Lab 11/26/15 1834 11/27/15 0546  WBC 6.9 6.6  HGB 9.6* 8.7*  HCT 31.5* 28.0*  MCV 75.6* 74.9*  PLT 105* 97*      Microbiology:  No results found for this or any previous visit (from the past 720 hour(s)).  Coagulation Studies: No results for input(s): LABPROT, INR in the last 72 hours.  Urinalysis:  Recent Labs  11/26/15 1834  COLORURINE STRAW*  LABSPEC 1.021  PHURINE 7.0  GLUCOSEU >500*  HGBUR 1+*  BILIRUBINUR NEGATIVE  KETONESUR TRACE*  PROTEINUR NEGATIVE  NITRITE NEGATIVE  LEUKOCYTESUR NEGATIVE      Imaging: Ct Pelvis W Contrast  11/27/2015  CLINICAL DATA:  Hepatic mass discovered on ultrasound. Concern for hepatic malignancy. EXAM: CT ABDOMEN WITHOUT AND WITH CONTRAST CT pelvis with contrast. TECHNIQUE: Multidetector CT imaging of the abdomen was performed following the standard protocol before and following the bolus administration of intravenous contrast. CT the pelvis performed following IV contrast. CONTRAST:  184m OMNIPAQUE IOHEXOL 350 MG/ML SOLN COMPARISON:  Ultrasound 11/27/2015, radiograph 11/26/2015 FINDINGS: Lower chest: Multiple  round pulmonary nodules at the lung bases of varying size consistent with lung metastasis. Example lesion in the RIGHT lower lobe measures 10 mm image 10, series 3. Example lesion LEFT lower lobe measures 11 mm on image 4, series 3. Hepatobiliary: Large RIGHT hepatic lobe mass occupying the near entirety of the posterior RIGHT hepatic lobe (segments 5, 6 and 7). Mass measures 12 cm x 12 cm in axial dimension and 17 cm in craniocaudad dimension. RIGHT hepatic lobe mass is partially exophytic and extends inferiorly and compresses the RIGHT kidney and extend into the space between the RIGHT kidney and the IVC. Several nodular extension this at this level (image 112, series 8). There is a smaller lesion in the LEFT hepatic lobe measuring 2.2 cm on image 48, series 8. The LEFT  portal vein is patent. 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 Main portal vein is patent. Pancreas: Pancreas is normal. No ductal dilatation. No pancreatic inflammation. Spleen: Normal spleen Adrenals/urinary tract: The the RIGHT adrenal gland is not identified secondary to the hepatic mass. The RIGHT kidney is depressed inferiorly. The LEFT kidney adrenal gland appear normal. Stomach/Bowel: Stomach, small bowel, appendix, and cecum are normal. The colon and rectosigmoid colon are normal. Vascular/Lymphatic: No clear lymphadenopathy. Nodule lesion between the IVC in the RIGHT kidney on image 112 is felt to be extension of the hepatic tumor itself. No pelvic lymphadenopathy. Reproductive: The fundus of the uterus has enhancing mass measuring 7.8 x 9.2 by 6.7 cm. No adnexal abnormality is obvious. Other: No free fluid. Musculoskeletal: There is an aggressive lesion along the anterior margin of the RIGHT iliac bone. This lesion or erupts through the cortex and measures 4.0 by 4.6 cm on image 164, series 8. The sclerosis of the SI joints. IMPRESSION: 1. Large RIGHT hepatic lobe mass which extends inferior to the pararenal space on the RIGHT. 2. 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 LEFT hepatic lobe lesion additionally. 3. Bilateral lower lobe pulmonary metastasis. 4. 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 5. Enhancing mass in the uterine fundus is likely a benign leiomyoma. Cannot exclude leiomyosarcoma given the extensive metastatic disease in no clear primary. 6. Differential considerations would include primary hepatocellular carcinoma versus metastatic disease from unknown primary. Recommend biopsying liver mass and FDG PET scan. Electronically Signed   By: Suzy Bouchard M.D.   On: 11/27/2015 14:21   Ct Abd Wo & W Cm  11/27/2015  CLINICAL DATA:  Hepatic mass discovered on ultrasound. Concern for hepatic malignancy. EXAM: CT ABDOMEN WITHOUT AND WITH CONTRAST CT pelvis with contrast.  TECHNIQUE: Multidetector CT imaging of the abdomen was performed following the standard protocol before and following the bolus administration of intravenous contrast. CT the pelvis performed following IV contrast. CONTRAST:  125m OMNIPAQUE IOHEXOL 350 MG/ML SOLN COMPARISON:  Ultrasound 11/27/2015, radiograph 11/26/2015 FINDINGS: Lower chest: Multiple round pulmonary nodules at the lung bases of varying size consistent with lung metastasis. Example lesion in the RIGHT lower lobe measures 10 mm image 10, series 3. Example lesion LEFT lower lobe measures 11 mm on image 4, series 3. Hepatobiliary: Large RIGHT hepatic lobe mass occupying the near entirety of the posterior RIGHT hepatic lobe (segments 5, 6 and 7). Mass measures 12 cm x 12 cm in axial dimension and 17 cm in craniocaudad dimension. RIGHT hepatic lobe mass is partially exophytic and extends inferiorly and compresses the RIGHT kidney and extend into the space between the RIGHT kidney and the IVC. Several nodular extension this at  this level (image 112, series 8). There is a smaller lesion in the LEFT hepatic lobe measuring 2.2 cm on image 48, series 8. The LEFT portal vein is patent. 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 Main portal vein is patent. Pancreas: Pancreas is normal. No ductal dilatation. No pancreatic inflammation. Spleen: Normal spleen Adrenals/urinary tract: The the RIGHT adrenal gland is not identified secondary to the hepatic mass. The RIGHT kidney is depressed inferiorly. The LEFT kidney adrenal gland appear normal. Stomach/Bowel: Stomach, small bowel, appendix, and cecum are normal. The colon and rectosigmoid colon are normal. Vascular/Lymphatic: No clear lymphadenopathy. Nodule lesion between the IVC in the RIGHT kidney on image 112 is felt to be extension of the hepatic tumor itself. No pelvic lymphadenopathy. Reproductive: The fundus of the uterus has enhancing mass measuring 7.8 x 9.2 by 6.7 cm. No adnexal abnormality is obvious.  Other: No free fluid. Musculoskeletal: There is an aggressive lesion along the anterior margin of the RIGHT iliac bone. This lesion or erupts through the cortex and measures 4.0 by 4.6 cm on image 164, series 8. The sclerosis of the SI joints. IMPRESSION: 1. Large RIGHT hepatic lobe mass which extends inferior to the pararenal space on the RIGHT. 2. 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 LEFT hepatic lobe lesion additionally. 3. Bilateral lower lobe pulmonary metastasis. 4. 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 5. Enhancing mass in the uterine fundus is likely a benign leiomyoma. Cannot exclude leiomyosarcoma given the extensive metastatic disease in no clear primary. 6. Differential considerations would include primary hepatocellular carcinoma versus metastatic disease from unknown primary. Recommend biopsying liver mass and FDG PET scan. Electronically Signed   By: Suzy Bouchard M.D.   On: 11/27/2015 14:21   Dg Chest Portable 1 View  11/26/2015  CLINICAL DATA:  59 year old with progressively worsening shortness of breath and bilateral upper extremity and lower extremity edema over the past few days. EXAM: PORTABLE CHEST 1 VIEW COMPARISON:  06/03/2014. FINDINGS: Marked elevation of the right hemidiaphragm with scar/atelectasis at the right lung base. Cardiac silhouette mildly to moderately enlarged. Pulmonary venous hypertension without overt edema. No visible pleural effusions. IMPRESSION: 1. Cardiomegaly. Pulmonary venous hypertension without overt edema currently. 2. Elevation of the right hemidiaphragm with scar/atelectasis at the right lung base. Electronically Signed   By: Evangeline Dakin M.D.   On: 11/26/2015 17:56   Dg Hand Complete Left  11/26/2015  CLINICAL DATA:  Fall onto left hand. Left hand swelling and pain. Initial encounter. EXAM: LEFT HAND - COMPLETE 3+ VIEW COMPARISON:  None. FINDINGS: There is no evidence of fracture or dislocation. Mild degenerative spurring  is seen involving the metacarpophalangeal joint of the thumb. No other bone abnormality identified. Marked soft tissue swelling is seen involving the dorsum of the hand and the wrist. IMPRESSION: Marked hand and wrist soft tissue swelling. No evidence of fracture or dislocation. Electronically Signed   By: Earle Gell M.D.   On: 11/26/2015 20:04   US Abdomen Limited Ruq  11/27/2015  CLINICAL DATA:  Elevated liver enzymes EXAM: US ABDOMEN LIMITED - RIGHT UPPER QUADRANT COMPARISON:  None. FINDINGS: Gallbladder: The gallbladder is predominantly contracted. No gallbladder wall thickening or pericholecystic fluid. Negative sonographic Murphy's sign per Common bile duct: Diameter: Normal at 4 mm. Liver: There is a large round heterogeneous solid-appearing mass within the RIGHT hepatic lobe measuring 18 by 11 by 12. This mass bulges the liver capsule. Additional smaller lesions noted in the LEFT hepatic lobe measuring up to  3.5 cm. No biliary duct dilatation. Mild heterogeneity of the liver parenchyma. IMPRESSION: 1. 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 Recommend CT of the abdomen and pelvis with contrast for further evaluation. 2. Contracted gallbladder without evidence of cholecystitis. These results will be called to the ordering clinician or representative by the Radiologist Assistant, and communication documented in the PACS or zVision Dashboard. Electronically Signed   By: Suzy Bouchard M.D.   On: 11/27/2015 09:35     Medications:     . antiseptic oral rinse  7 mL Mouth Rinse BID  . cloNIDine  0.3 mg Oral BID  . hydrALAZINE  50 mg Oral TID  . insulin aspart  0-15 Units Subcutaneous TID AC & HS  . insulin glargine  25 Units Subcutaneous QHS  . irbesartan  75 mg Oral Daily  . isosorbide mononitrate  60 mg Oral Daily  . labetalol  200 mg Oral BID  . potassium chloride  20 mEq Oral BID  . sodium chloride   3 mL Intravenous Q12H   acetaminophen **OR** acetaminophen, hydrALAZINE, ondansetron **OR** ondansetron (ZOFRAN) IV, oxyCODONE-acetaminophen, polyethylene glycol  Assessment/ Plan:  59 y.o. female with a PMHX of long-standing hypertension, hip arthritis, stroke, with residual left-sided weakness, was admitted on 11/26/2015 with worsening generalized edema, dyspnea on exertion, found to have hypokalemia upon admission. Ultrasound of the abdomen shows large mass in right hepatic lobe, concerning for hepatic carcinoma  1. Generalized edema  2. Hypokalemia 3. Hypernatremia 4. Glucosuria, hyperglycemia 5. Severe hypertension 6. Liver mass with metastases by ultrasound, CT of the abdomen  7. Anemia, unspecified 8. Thrombocytopenia  9. Proteinuria 10. New diagnosis of DM-2 (HbA1c 9.5%)  Patient presents as an interesting case. She has been having worsening of generalized edema for the last few months now. She has incidental finding of right hepatic mass concerning for hepatic carcinoma. She also has incidental findings of glucosuria, hematuria and hyperglycemia. She has thrombocytopenia and anemia which go along liver disease. She has severe hypokalemia. Potassium is being replaced aggressively at present.  Plan: urine protein/creatinine ratio is 1.30 (non nephrotic) hemoglobin A1c 9.5% Would like to obtain aldosterone/renin ratio but patient has been on spironolactone therefore results can be skewed. Defer this evaluation for now. Would like to take patient off of vasodilators such as hydralazine. Dose of clonidine also eventually needs to be reduced. Instead of lisinopril, would like to get patient started on ARB. Irbesartan as available on [place] Would avoid nephrotoxins such as nonsteroidals and IV contrast if possible    LOS: 2 Adonia Porada 12/11/201612:18 PM

## 2015-11-28 NOTE — Progress Notes (Signed)
Pt in chair,unable to get to her feet in time to use BSC, urinated in floor, cleaned her up and changed pad, gown, and socks.  Pt has difficulty getting to her feet.  Left side weak

## 2015-11-28 NOTE — Progress Notes (Signed)
Patient ID: Renee Wells, female   DOB: 05-26-1956, 59 y.o.   MRN: [id number] [facility] PROGRESS NOTE  PCP: Sharyne Peach, MD  HPI/Subjective: Patient has some nausea. Patient can understand how something like this could've happened. She states that she didn't have any symptoms and that she is not a drinker or smoker.  Objective: Filed Vitals:   11/27/15 2134 11/28/15 0458  BP: 166/86 162/86  Pulse: 88 83  Temp: 98.6 F (37 C) 98.3 F (36.8 C)  Resp: 18 18    Filed Weights   11/26/15 1649 11/26/15 2236 11/28/15 0458  Weight: 124.739 kg (275 lb) 120.385 kg (265 lb 6.4 oz) 121.745 kg (268 lb 6.4 oz)    ROS: Review of Systems  Constitutional: Negative for fever and chills.  Eyes: Negative for blurred vision.  Respiratory: Negative for cough and shortness of breath.   Cardiovascular: Negative for chest pain.  Gastrointestinal: Positive for nausea. Negative for vomiting, abdominal pain, diarrhea and constipation.  Genitourinary: Negative for dysuria.  Musculoskeletal: Positive for joint pain.  Neurological: Negative for dizziness and headaches.   Exam: Physical Exam  Constitutional: She is oriented to person, place, and time.  HENT:  Nose: No mucosal edema.  Mouth/Throat: No oropharyngeal exudate or posterior oropharyngeal edema.  Eyes: Conjunctivae, EOM and lids are normal. Pupils are equal, round, and reactive to light.  Neck: No JVD present. Carotid bruit is not present. No edema present. No thyroid mass and no thyromegaly present.  Cardiovascular: S1 normal and S2 normal.  Exam reveals no gallop.   No murmur heard. Pulses:      Dorsalis pedis pulses are 2+ on the right side, and 2+ on the left side.  Respiratory: No respiratory distress. She has decreased breath sounds in the right lower field and the left lower field. She has no wheezes. She has no rhonchi. She has no rales.  GI: Soft. Bowel sounds are normal. There is no tenderness.   Musculoskeletal:       Right ankle: She exhibits swelling.       Left ankle: She exhibits swelling.  Lymphadenopathy:    She has no cervical adenopathy.  Neurological: She is alert and oriented to person, place, and time. No cranial nerve deficit.  Skin: Skin is warm. No rash noted. Nails show no clubbing.  Psychiatric: She has a normal mood and affect.    Data Reviewed: Basic Metabolic Panel:  Recent Labs Lab 11/26/15 1834 11/27/15 0546 11/27/15 1519 11/28/15 0715  NA 144 147*  --  141  K 2.6* 2.9* 3.8 4.6  CL 99* 103  --  103  CO2 32 36*  --  31  GLUCOSE 678* 386*  --  313*  BUN 24* 21*  --  19  CREATININE 1.19* 1.03*  --  0.97  CALCIUM 9.1 8.8*  --  8.8*  MG 2.1  --   --   --    Liver Function Tests:  Recent Labs Lab 11/26/15 1834  AST 48*  ALT 72*  ALKPHOS 238*  BILITOT 1.3*  PROT 6.1*  ALBUMIN 3.2*   CBC:  Recent Labs Lab 11/26/15 1834 11/27/15 0546  WBC 6.9 6.6  HGB 9.6* 8.7*  HCT 31.5* 28.0*  MCV 75.6* 74.9*  PLT 105* 97*    CBG:  Recent Labs Lab 11/27/15 0235 11/27/15 0738 11/27/15 1109 11/27/15 2136 11/28/15 0747  GLUCAP 440* 350* 393* 398* 308*    Studies: Ct Pelvis W Contrast  11/27/2015  CLINICAL  DATA:  Hepatic mass discovered on ultrasound. Concern for hepatic malignancy. EXAM: CT ABDOMEN WITHOUT AND WITH CONTRAST CT pelvis with contrast. TECHNIQUE: Multidetector CT imaging of the abdomen was performed following the standard protocol before and following the bolus administration of intravenous contrast. CT the pelvis performed following IV contrast. CONTRAST:  167m OMNIPAQUE IOHEXOL 350 MG/ML SOLN COMPARISON:  Ultrasound 11/27/2015, radiograph 11/26/2015 FINDINGS: Lower chest: Multiple round pulmonary nodules at the lung bases of varying size consistent with lung metastasis. Example lesion in the RIGHT lower lobe measures 10 mm image 10, series 3. Example lesion LEFT lower lobe measures 11 mm on image 4, series 3. Hepatobiliary:  Large RIGHT hepatic lobe mass occupying the near entirety of the posterior RIGHT hepatic lobe (segments 5, 6 and 7). Mass measures 12 cm x 12 cm in axial dimension and 17 cm in craniocaudad dimension. RIGHT hepatic lobe mass is partially exophytic and extends inferiorly and compresses the RIGHT kidney and extend into the space between the RIGHT kidney and the IVC. Several nodular extension this at this level (image 112, series 8). There is a smaller lesion in the LEFT hepatic lobe measuring 2.2 cm on image 48, series 8. The LEFT portal vein is patent. 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 Main portal vein is patent. Pancreas: Pancreas is normal. No ductal dilatation. No pancreatic inflammation. Spleen: Normal spleen Adrenals/urinary tract: The the RIGHT adrenal gland is not identified secondary to the hepatic mass. The RIGHT kidney is depressed inferiorly. The LEFT kidney adrenal gland appear normal. Stomach/Bowel: Stomach, small bowel, appendix, and cecum are normal. The colon and rectosigmoid colon are normal. Vascular/Lymphatic: No clear lymphadenopathy. Nodule lesion between the IVC in the RIGHT kidney on image 112 is felt to be extension of the hepatic tumor itself. No pelvic lymphadenopathy. Reproductive: The fundus of the uterus has enhancing mass measuring 7.8 x 9.2 by 6.7 cm. No adnexal abnormality is obvious. Other: No free fluid. Musculoskeletal: There is an aggressive lesion along the anterior margin of the RIGHT iliac bone. This lesion or erupts through the cortex and measures 4.0 by 4.6 cm on image 164, series 8. The sclerosis of the SI joints. IMPRESSION: 1. Large RIGHT hepatic lobe mass which extends inferior to the pararenal space on the RIGHT. 2. 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 LEFT hepatic lobe lesion additionally. 3. Bilateral lower lobe pulmonary metastasis. 4. 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 5. Enhancing mass in the uterine fundus is likely a benign  leiomyoma. Cannot exclude leiomyosarcoma given the extensive metastatic disease in no clear primary. 6. Differential considerations would include primary hepatocellular carcinoma versus metastatic disease from unknown primary. Recommend biopsying liver mass and FDG PET scan. Electronically Signed   By: SSuzy BouchardM.D.   On: 11/27/2015 14:21   Ct Abd Wo & W Cm  11/27/2015  CLINICAL DATA:  Hepatic mass discovered on ultrasound. Concern for hepatic malignancy. EXAM: CT ABDOMEN WITHOUT AND WITH CONTRAST CT pelvis with contrast. TECHNIQUE: Multidetector CT imaging of the abdomen was performed following the standard protocol before and following the bolus administration of intravenous contrast. CT the pelvis performed following IV contrast. CONTRAST:  104mOMNIPAQUE IOHEXOL 350 MG/ML SOLN COMPARISON:  Ultrasound 11/27/2015, radiograph 11/26/2015 FINDINGS: Lower chest: Multiple round pulmonary nodules at the lung bases of varying size consistent with lung metastasis. Example lesion in the RIGHT lower lobe measures 10 mm image 10, series 3. Example lesion LEFT lower lobe measures 11 mm on image 4, series 3.  Hepatobiliary: Large RIGHT hepatic lobe mass occupying the near entirety of the posterior RIGHT hepatic lobe (segments 5, 6 and 7). Mass measures 12 cm x 12 cm in axial dimension and 17 cm in craniocaudad dimension. RIGHT hepatic lobe mass is partially exophytic and extends inferiorly and compresses the RIGHT kidney and extend into the space between the RIGHT kidney and the IVC. Several nodular extension this at this level (image 112, series 8). There is a smaller lesion in the LEFT hepatic lobe measuring 2.2 cm on image 48, series 8. The LEFT portal vein is patent. 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 Main portal vein is patent. Pancreas: Pancreas is normal. No ductal dilatation. No pancreatic inflammation. Spleen: Normal spleen Adrenals/urinary tract: The the RIGHT adrenal gland is not identified secondary  to the hepatic mass. The RIGHT kidney is depressed inferiorly. The LEFT kidney adrenal gland appear normal. Stomach/Bowel: Stomach, small bowel, appendix, and cecum are normal. The colon and rectosigmoid colon are normal. Vascular/Lymphatic: No clear lymphadenopathy. Nodule lesion between the IVC in the RIGHT kidney on image 112 is felt to be extension of the hepatic tumor itself. No pelvic lymphadenopathy. Reproductive: The fundus of the uterus has enhancing mass measuring 7.8 x 9.2 by 6.7 cm. No adnexal abnormality is obvious. Other: No free fluid. Musculoskeletal: There is an aggressive lesion along the anterior margin of the RIGHT iliac bone. This lesion or erupts through the cortex and measures 4.0 by 4.6 cm on image 164, series 8. The sclerosis of the SI joints. IMPRESSION: 1. Large RIGHT hepatic lobe mass which extends inferior to the pararenal space on the RIGHT. 2. 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 LEFT hepatic lobe lesion additionally. 3. Bilateral lower lobe pulmonary metastasis. 4. 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 5. Enhancing mass in the uterine fundus is likely a benign leiomyoma. Cannot exclude leiomyosarcoma given the extensive metastatic disease in no clear primary. 6. Differential considerations would include primary hepatocellular carcinoma versus metastatic disease from unknown primary. Recommend biopsying liver mass and FDG PET scan. Electronically Signed   By: Suzy Bouchard M.D.   On: 11/27/2015 14:21   Dg Chest Portable 1 View  11/26/2015  CLINICAL DATA:  59 year old with progressively worsening shortness of breath and bilateral upper extremity and lower extremity edema over the past few days. EXAM: PORTABLE CHEST 1 VIEW COMPARISON:  06/03/2014. FINDINGS: Marked elevation of the right hemidiaphragm with scar/atelectasis at the right lung base. Cardiac silhouette mildly to moderately enlarged. Pulmonary venous hypertension without overt edema. No  visible pleural effusions. IMPRESSION: 1. Cardiomegaly. Pulmonary venous hypertension without overt edema currently. 2. Elevation of the right hemidiaphragm with scar/atelectasis at the right lung base. Electronically Signed   By: Evangeline Dakin M.D.   On: 11/26/2015 17:56   Dg Hand Complete Left  11/26/2015  CLINICAL DATA:  Fall onto left hand. Left hand swelling and pain. Initial encounter. EXAM: LEFT HAND - COMPLETE 3+ VIEW COMPARISON:  None. FINDINGS: There is no evidence of fracture or dislocation. Mild degenerative spurring is seen involving the metacarpophalangeal joint of the thumb. No other bone abnormality identified. Marked soft tissue swelling is seen involving the dorsum of the hand and the wrist. IMPRESSION: Marked hand and wrist soft tissue swelling. No evidence of fracture or dislocation. Electronically Signed   By: Earle Gell M.D.   On: 11/26/2015 20:04   US Abdomen Limited Ruq  11/27/2015  CLINICAL DATA:  Elevated liver enzymes EXAM: US ABDOMEN LIMITED - RIGHT UPPER  QUADRANT COMPARISON:  None. FINDINGS: Gallbladder: The gallbladder is predominantly contracted. No gallbladder wall thickening or pericholecystic fluid. Negative sonographic Murphy's sign per Common bile duct: Diameter: Normal at 4 mm. Liver: There is a large round heterogeneous solid-appearing mass within the RIGHT hepatic lobe measuring 18 by 11 by 12. This mass bulges the liver capsule. Additional smaller lesions noted in the LEFT hepatic lobe measuring up to 3.5 cm. No biliary duct dilatation. Mild heterogeneity of the liver parenchyma. IMPRESSION: 1. 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 Recommend CT of the abdomen and pelvis with contrast for further evaluation. 2. Contracted gallbladder without evidence of cholecystitis. These results will be called to the ordering clinician or representative by the Radiologist Assistant, and  communication documented in the PACS or zVision Dashboard. Electronically Signed   By: Suzy Bouchard M.D.   On: 11/27/2015 09:35    Scheduled Meds: . antiseptic oral rinse  7 mL Mouth Rinse BID  . cloNIDine  0.3 mg Oral BID  . furosemide  40 mg Intravenous Once  . hydrALAZINE  50 mg Oral TID  . insulin aspart  0-15 Units Subcutaneous TID AC & HS  . insulin glargine  20 Units Subcutaneous QHS  . irbesartan  75 mg Oral Daily  . isosorbide mononitrate  60 mg Oral Daily  . labetalol  200 mg Oral BID  . potassium chloride  20 mEq Oral BID  . sodium chloride  3 mL Intravenous Q12H    Assessment/Plan:  1. Metastatic cancer, as per oncology likely liver primary. Large masses on liver, pulmonary nodules, lesion on bone and also uterus mass. Appreciate oncology consultation. I added on alpha-fetoprotein and CEA tumor markers. Hold aspirin at this time. I spoke with interventional radiology and they will do a ultrasound guided biopsy of the liver tomorrow. I will check a PT INR today and repeat a CBC tomorrow. 2. Severe hypokalemia- replaced into normal range and I will try to cut back on potassium supplementation 3. Anasarca- will give 1 dose of IV Lasix today and can probably convert over to oral tomorrow 4. Essential hypertension on numerous medications 5. Diabetes mellitus-  hemoglobin A1c is 9.5, increase Lantus dose only slightly today because the patient will be nothing by mouth tomorrow morning. and continue sliding scale 6. Morbid obesity 7. History of stroke- hold aspirin for biopsy tomorrow. Restart aspirin soon. Patient could have been hypercoagulable with metastatic cancer.   Code Status:     Code Status Orders        Start     Ordered   11/26/15 2241  Full code   Continuous     11/26/15 2240     Disposition Plan: To be determined, spoke with son on the phone and given update.  Consultants:  Nephrology  oncology  Time spent: 30 minutes   Loletha Grayer  [facility]

## 2015-11-28 NOTE — Evaluation (Signed)
Physical Therapy Evaluation Patient Details Name: Renee Wells MRN: [id number] DOB: [birth year] Today's Date: 11/28/2015   History of Present Illness  59 yo Female was brought to ED with increased swelling in BLE, increased shortness of breath and general decline of physical functioning. Patient has a PMH significant for CVA with left sided weakness approximately 3 years ago. She aws admitted with Hypokalemia; While in hospital patient was found to be hyperglycemic with Hemaglobin A1C 9.5. She also received an [place] of abdomen and found a hepatic mass. patient is being worked up for possible lung/liver CA. She is supposed to get biopsy on Monday 11/29/15;   Clinical Impression  59 yo Female came to ED with increased swelling (edema) in BLE, shortness of breath. Patient has a PMH significant for CVA with left sided weakness approximately 3 years ago. Patient reports using standard [place] prior to admittance and required assistance from family for most ADLs. Patient demonstrates significant weakness in BLE. She also demonstrates low endurance with increased shortness of breath during most mobility tasks. She is currently on 3 L of O2. Patient was min A for bed mobility, mod A for sit<>Stand transfer with RW, min A for gait limited to 10 feet due to fatigue. Patient has 12-15 steps that she must negotiate to get in/out of apartment. PT concerned that patient is not currently at a safe functional level to negotiate these stairs. She would benefit from additional skilled PT intervention to improve LE strength, balance and gait safety;     Follow Up Recommendations SNF    Equipment Recommendations   (to be determined)    Recommendations for Other Services       Precautions / Restrictions Precautions Precautions: Fall Restrictions Weight Bearing Restrictions: No      Mobility  Bed Mobility Overal bed mobility: Needs Assistance Bed Mobility: Supine to Sit     Supine to sit: Min assist;HOB  elevated     General bed mobility comments: using bed rails; requires min A to initiate LLE movement; Patient had increased difficulty sitting edge of bed due to obesity and weakness.   Transfers Overall transfer level: Needs assistance Equipment used: Rolling Pirie (2 wheeled) Transfers: Sit to/from Stand Sit to Stand: Mod assist         General transfer comment: sit<>stand from bed with RW, mod A +1 with min Vcs for hand placement and to increase forward trunk lean for better transfer ability; Patient sat down in recliner and required mod VCs to reach back for chair; despite cues patient just plopped down in chair due to weakness;   Ambulation/Gait Ambulation/Gait assistance: Min assist Ambulation Distance (Feet): 10 Feet Assistive device: Rolling Gerrard (2 wheeled) Gait Pattern/deviations: Step-to pattern;Decreased step length - right;Decreased step length - left;Decreased stance time - left;Decreased dorsiflexion - left;Shuffle;Wide base of support Gait velocity: decreased   General Gait Details: ambulated with slower gait speed and forward flexed posture; Required min VCs to increase LLE step length for better step through pattern. patient demonstrates a 3 point gait pattern;   Stairs            Wheelchair Mobility    Modified Rankin (Stroke Patients Only)       Balance Overall balance assessment: Needs assistance Sitting-balance support: Single extremity supported Sitting balance-Leahy Scale: Good Sitting balance - Comments: able to sit with 1 HHA with good balance control (static and dynamic)   Standing balance support: Bilateral upper extremity supported Standing balance-Leahy Scale: Fair Standing balance comment: Patient requires  min A for balance control with 2 HHA on RW;                              Pertinent Vitals/Pain Pain Assessment: No/denies pain    Home Living Family/patient expects to be discharged to:: Private residence Living  Arrangements: Children Available Help at Discharge: Family Type of Home: House Home Access: Stairs to enter Entrance Stairs-Rails: Right;Left;Can reach both Entrance Stairs-Number of Steps: 12-15 steps with Bilateral rails Home Layout: One level Home Equipment: Delahoz - standard;Cane - quad      Prior Function Level of Independence: Needs assistance   Gait / Transfers Assistance Needed: used Carder, was limited to short distances  ADL's / Homemaking Assistance Needed: son and family did most of cooking/cleaning;  Comments: patient reports that she mostly sponge bathed prior to admission. She is able to negotiate steps at home but requires assistance from her son.      Hand Dominance   Dominant Hand: Right    Extremity/Trunk Assessment   Upper Extremity Assessment: LUE deficits/detail;RUE deficits/detail RUE Deficits / Details: grossly 4/5     LUE Deficits / Details: decreased LUE overhead reach due to weakness from stroke. Able to initiate some movement, but minimal;    Lower Extremity Assessment: RLE deficits/detail;LLE deficits/detail RLE Deficits / Details: intact light touch sensation; gross strength is 4/5 throughout LE; ROM is WFL LLE Deficits / Details: intact light touch sensation; decreased AROM especially in hip and knee; able to initiate slight ankle ROM; gross strength is 2/5 throughout LE;   Cervical / Trunk Assessment: Kyphotic  Communication   Communication: No difficulties  Cognition Arousal/Alertness: Awake/alert Behavior During Therapy: WFL for tasks assessed/performed Overall Cognitive Status: Within Functional Limits for tasks assessed                      General Comments General comments (skin integrity, edema, etc.): intact by gross assessment    Exercises        Assessment/Plan    PT Assessment Patient needs continued PT services  PT Diagnosis Difficulty walking;Abnormality of gait;Generalized weakness   PT Problem List  Decreased strength;Decreased range of motion;Decreased safety awareness;Decreased activity tolerance;Decreased balance;Decreased mobility;Cardiopulmonary status limiting activity;Obesity  PT Treatment Interventions DME instruction;Gait training;Stair training;Functional mobility training;Therapeutic activities;Therapeutic exercise;Balance training;Neuromuscular re-education;Patient/family education   PT Goals (Current goals can be found in the Care Plan section) Acute Rehab PT Goals Patient Stated Goal: "I want to go home" PT Goal Formulation: With patient Time For Goal Achievement: 12/12/15 Potential to Achieve Goals: Fair    Frequency Min 2X/week   Barriers to discharge Inaccessible home environment Pt has 12-15 stairs that she must negotiate to go home. Based on her current mobility status and low endurance, PT concerned that patient would not be able to negotiate the stairs for safe entry/exit.     Co-evaluation               End of Session Equipment Utilized During Treatment: Gait belt Activity Tolerance: Patient limited by fatigue Patient left: in chair;with call bell/phone within reach;with chair alarm set;with nursing/sitter in room Nurse Communication: Mobility status         Time: 0277-4128 PT Time Calculation (min) (ACUTE ONLY): 35 min   Charges:   PT Evaluation $Initial PT Evaluation Tier I: 1 Procedure     PT G Codes:         Sanika Brosious PT, DPT 11/28/2015, 12:38  PM   

## 2015-11-29 ENCOUNTER — Inpatient Hospital Stay: Payer: Medicaid Other

## 2015-11-29 LAB — BASIC METABOLIC PANEL
ANION GAP: 4 — AB (ref 5–15)
BUN: 21 mg/dL — ABNORMAL HIGH (ref 6–20)
CALCIUM: 8.8 mg/dL — AB (ref 8.9–10.3)
CHLORIDE: 102 mmol/L (ref 101–111)
CO2: 36 mmol/L — AB (ref 22–32)
Creatinine, Ser: 0.98 mg/dL (ref 0.44–1.00)
GFR calc non Af Amer: >60 mL/min (ref >60)
GLUCOSE: 372 mg/dL — AB (ref 65–99)
Potassium: 4 mmol/L (ref 3.5–5.1)
Sodium: 142 mmol/L (ref 135–145)

## 2015-11-29 LAB — PROTEIN ELECTROPHORESIS, SERUM
A/G Ratio: 1.3 (ref 0.7–1.7)
ALPHA-1-GLOBULIN: 0.3 g/dL (ref 0.0–0.4)
ALPHA-2-GLOBULIN: 0.5 g/dL (ref 0.4–1.0)
Albumin ELP: 2.6 g/dL — ABNORMAL LOW (ref 2.9–4.4)
Beta Globulin: 0.8 g/dL (ref 0.7–1.3)
GLOBULIN, TOTAL: 2 g/dL — AB (ref 2.2–3.9)
Gamma Globulin: 0.3 g/dL — ABNORMAL LOW (ref 0.4–1.8)
M-SPIKE, %: 0.2 g/dL — AB
Total Protein ELP: 4.6 g/dL — ABNORMAL LOW (ref 6.0–8.5)

## 2015-11-29 LAB — GLUCOSE, CAPILLARY
GLUCOSE-CAPILLARY: 297 mg/dL — AB (ref 65–99)
Glucose-Capillary: 340 mg/dL — ABNORMAL HIGH (ref 65–99)
Glucose-Capillary: 230 mg/dL — ABNORMAL HIGH (ref 65–99)
Glucose-Capillary: 299 mg/dL — ABNORMAL HIGH (ref 65–99)

## 2015-11-29 LAB — ANA W/REFLEX IF POSITIVE: Anti Nuclear Antibody(ANA): NEGATIVE

## 2015-11-29 LAB — VITAMIN D 25 HYDROXY (VIT D DEFICIENCY, FRACTURES): VIT D 25 HYDROXY: 8.4 ng/mL — AB (ref 30.0–100.0)

## 2015-11-29 LAB — PARATHYROID HORMONE, INTACT (NO CA): PTH: 33 pg/mL (ref 15–65)

## 2015-11-29 MED ORDER — ASPIRIN 81 MG PO CHEW
81.0000 mg | CHEWABLE_TABLET | Freq: Every day | ORAL | Status: DC
  Administered 2015-11-30 – 2015-12-03 (×4): 81 mg via ORAL
  Filled 2015-11-29 (×4): qty 1

## 2015-11-29 MED ORDER — INSULIN ASPART 100 UNIT/ML ~~LOC~~ SOLN
0.0000 [IU] | Freq: Three times a day (TID) | SUBCUTANEOUS | Status: DC
  Administered 2015-11-29 – 2015-11-30 (×2): 8 [IU] via SUBCUTANEOUS
  Administered 2015-11-30 – 2015-12-01 (×3): 5 [IU] via SUBCUTANEOUS
  Administered 2015-12-01: 3 [IU] via SUBCUTANEOUS
  Administered 2015-12-02: 2 [IU] via SUBCUTANEOUS
  Administered 2015-12-02: 3 [IU] via SUBCUTANEOUS
  Administered 2015-12-02: 2 [IU] via SUBCUTANEOUS
  Administered 2015-12-03: 8 [IU] via SUBCUTANEOUS
  Administered 2015-12-03: 5 [IU] via SUBCUTANEOUS
  Filled 2015-11-29: qty 5
  Filled 2015-11-29: qty 3
  Filled 2015-11-29: qty 8
  Filled 2015-11-29: qty 5
  Filled 2015-11-29: qty 3
  Filled 2015-11-29: qty 5
  Filled 2015-11-29: qty 8
  Filled 2015-11-29 (×2): qty 2
  Filled 2015-11-29: qty 8
  Filled 2015-11-29: qty 5

## 2015-11-29 MED ORDER — LIVING WELL WITH DIABETES BOOK
Freq: Once | Status: AC
  Administered 2015-11-29: 13:00:00
  Filled 2015-11-29: qty 1

## 2015-11-29 MED ORDER — FENTANYL CITRATE (PF) 100 MCG/2ML IJ SOLN
INTRAMUSCULAR | Status: AC
  Filled 2015-11-29: qty 2

## 2015-11-29 MED ORDER — FUROSEMIDE 10 MG/ML IJ SOLN
40.0000 mg | Freq: Two times a day (BID) | INTRAMUSCULAR | Status: DC
  Administered 2015-11-29 – 2015-12-02 (×6): 40 mg via INTRAVENOUS
  Filled 2015-11-29 (×6): qty 4

## 2015-11-29 MED ORDER — MIDAZOLAM HCL 2 MG/2ML IJ SOLN
INTRAMUSCULAR | Status: AC
  Filled 2015-11-29: qty 2

## 2015-11-29 MED ORDER — INSULIN GLARGINE 100 UNIT/ML ~~LOC~~ SOLN
40.0000 [IU] | Freq: Every day | SUBCUTANEOUS | Status: DC
  Administered 2015-11-29 – 2015-12-03 (×4): 40 [IU] via SUBCUTANEOUS
  Filled 2015-11-29 (×6): qty 0.4

## 2015-11-29 MED ORDER — INSULIN ASPART 100 UNIT/ML ~~LOC~~ SOLN
8.0000 [IU] | Freq: Three times a day (TID) | SUBCUTANEOUS | Status: DC
  Administered 2015-11-29 – 2015-11-30 (×3): 8 [IU] via SUBCUTANEOUS
  Filled 2015-11-29 (×3): qty 8

## 2015-11-29 MED ORDER — INSULIN ASPART 100 UNIT/ML ~~LOC~~ SOLN
0.0000 [IU] | Freq: Every day | SUBCUTANEOUS | Status: DC
  Administered 2015-11-29: 3 [IU] via SUBCUTANEOUS
  Administered 2015-11-30: 5 [IU] via SUBCUTANEOUS
  Filled 2015-11-29: qty 3
  Filled 2015-11-29: qty 5

## 2015-11-29 MED ORDER — INSULIN ASPART 100 UNIT/ML ~~LOC~~ SOLN
0.0000 [IU] | Freq: Three times a day (TID) | SUBCUTANEOUS | Status: DC
  Administered 2015-11-29: 7 [IU] via SUBCUTANEOUS
  Filled 2015-11-29: qty 7

## 2015-11-29 MED ORDER — INSULIN GLARGINE 100 UNIT/ML ~~LOC~~ SOLN
30.0000 [IU] | Freq: Every day | SUBCUTANEOUS | Status: DC
  Filled 2015-11-29: qty 0.3

## 2015-11-29 MED ORDER — HYDROCODONE-ACETAMINOPHEN 5-325 MG PO TABS
1.0000 | ORAL_TABLET | ORAL | Status: DC | PRN
  Administered 2015-11-29: 2 via ORAL
  Administered 2015-11-30: 1 via ORAL
  Filled 2015-11-29: qty 1

## 2015-11-29 MED ORDER — SODIUM CHLORIDE 0.9 % IV SOLN
INTRAVENOUS | Status: AC | PRN
  Administered 2015-11-29: 10 mL/h via INTRAVENOUS

## 2015-11-29 MED ORDER — INSULIN STARTER KIT- PEN NEEDLES (ENGLISH)
1.0000 | Freq: Once | Status: AC
  Administered 2015-11-29: 1
  Filled 2015-11-29: qty 1

## 2015-11-29 MED ORDER — HYDROCODONE-ACETAMINOPHEN 5-325 MG PO TABS
ORAL_TABLET | ORAL | Status: AC
  Filled 2015-11-29: qty 2

## 2015-11-29 NOTE — Consult Note (Signed)
ENDOCRINOLOGY CONSULTATION  REFERRING PHYSICIAN: Demetrios Loll, MD CONSULTING PHYSICIAN:  A. Lavone Orn, MD.  CHIEF COMPLAINT:  Hyperglycemia  HISTORY OF PRESENT ILLNESS:  59 y.o. female seen in consultation for a new diagnosis of diabetes mellitus, with Hgb A1c 9.5%.  Patient was interviewed. Son at bedside. Chart was reviewed. She was admitted on 11/26/15 with SOB, leg edema, and weight gain. Work-up including RUQ [place] notable for liver mass and then CT scan notable for multiple liver masses (up to 12 cm) and a 7.8 cm uterine mass. She underwent CT guided liver biopsy of the mass today.  No prior h/o diabetes. Hgb A1c in 06/2015 was 5.8%. She has not had any glucocorticoids that she can recall. Hospitalized in 06/2015 for hypokalemia, however no other recent illness. Appetite poor prior to admission with nausea, now improved. She was having two weeks of increased thirst and polyuria. No blurred vision.    PAST MEDICAL HISTORY:  Past Medical History  Diagnosis Date  . Stroke ([place])   . Hypertension   . HLD (hyperlipidemia)   . Obesity      CURRENT MEDICATIONS:  . antiseptic oral rinse  7 mL Mouth Rinse BID  . [START ON 11/30/2015] aspirin  81 mg Oral Daily  . cloNIDine  0.3 mg Oral BID  . furosemide  40 mg Intravenous Q12H  . hydrALAZINE  50 mg Oral TID  . HYDROcodone-acetaminophen      . insulin aspart  0-15 Units Subcutaneous TID WC  . insulin aspart  0-5 Units Subcutaneous QHS        . insulin glargine  25 Units Subcutaneous QHS  . irbesartan  75 mg Oral Daily  . isosorbide mononitrate  60 mg Oral Daily  . labetalol  200 mg Oral BID  . potassium chloride  20 mEq Oral BID  . sodium chloride  3 mL Intravenous Q12H     SOCIAL HISTORY:  Social History  Substance Use Topics  . Smoking status: Never Smoker   . Smokeless tobacco: None  . Alcohol Use: No     Comment: occasional    FAMILY HISTORY:   Family History   Problem Relation Age of Onset  . Heart disease Mother   . Hypertension Mother   . Diabetes Mother   . Diabetes Sister   . Hyperlipidemia Sister     ALLERGIES:  Allergies  Allergen Reactions  . Penicillins Itching    REVIEW OF SYSTEMS:  GENERAL:  No weight loss.  No fever.  20 Lb weight gain in months prior to admission. HEENT:  No blurred vision. No sore throat.  NECK:  No neck pain or dysphagia.  CARDIAC:  No chest pain or palpitation.  PULMONARY:  No cough or shortness of breath.  ABDOMEN:  Mild abdominal pain - rt sided.  No constipation. EXTREMITIES:  + lower extremity swelling.  ENDOCRINE:  No heat or cold intolerance.  GENITOURINARY:  No dysuria or hematuria. SKIN:  No recent rash or skin changes.   PHYSICAL EXAMINATION:  BP 145/78 mmHg  Pulse 80  Temp(Src) 98.3 F (36.8 C) (Oral)  Resp 21  Ht '5\' 5"'$  (1.651 m)  Wt 121.655 kg (268 lb 3.2 oz)  BMI 44.63 kg/m2  SpO2 100%  GENERAL: Obese AAF in NAD. HEENT:  EOMI.  Oropharynx is clear.  NECK:  Supple.  No thyromegaly.  No neck tenderness.  CARDIAC:  Regular rate and rhythm without murmur.  PULMONARY:  Clear to auscultation bilaterally. No wheeze. ABDOMEN:  Mild RUQ  TTP due to liver biopsy earlier today, appropriate. NABS.  EXTREMITIES: 2+ peripheral edema is present.    SKIN:  No rash or dermatopathy. NEUROLOGIC:  No dysarthria.  No tremor. PSYCHIATRIC:  Alert and oriented, calm, cooperative.   LABORATORY DATA:  Results for orders placed or performed during the hospital encounter of 11/26/15 (from the past 24 hour(s))  Glucose, capillary     Status: Abnormal   Collection Time: 11/28/15  9:09 PM  Result Value Ref Range   Glucose-Capillary 356 (H) 65 - 99 mg/dL  Basic metabolic panel     Status: Abnormal   Collection Time: 11/29/15  5:56 AM  Result Value Ref Range   Sodium 142 135 - 145 mmol/L   Potassium 4.0 3.5 - 5.1 mmol/L   Chloride 102 101 - 111 mmol/L   CO2 36 (H) 22 - 32 mmol/L   Glucose, Bld 372  (H) 65 - 99 mg/dL   BUN 21 (H) 6 - 20 mg/dL   Creatinine, Ser 0.98 0.44 - 1.00 mg/dL   Calcium 8.8 (L) 8.9 - 10.3 mg/dL   GFR calc non Af Amer >60 >60 mL/min   GFR calc Af Amer >60 >60 mL/min   Anion gap 4 (L) 5 - 15  Glucose, capillary     Status: Abnormal   Collection Time: 11/29/15  8:02 AM  Result Value Ref Range   Glucose-Capillary 340 (H) 65 - 99 mg/dL   Comment 1 Notify RN   Glucose, capillary     Status: Abnormal   Collection Time: 11/29/15 12:55 PM  Result Value Ref Range   Glucose-Capillary 230 (H) 65 - 99 mg/dL  Glucose, capillary     Status: Abnormal   Collection Time: 11/29/15  4:20 PM  Result Value Ref Range   Glucose-Capillary 297 (H) 65 - 99 mg/dL   Comment 1 Notify RN    Comment 2 Document in Chart     ASSESSMENT:  1. Diabetes mellitus - new diagnosis 2.   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  PLAN: 1. Discussed diabetes and signs/symptoms of hyperglycemia. Discussed goal sugars and need for regular BG monitoring. 2. Agree with basal - prandial insulin. Over last 24 hrs, she has received 66 units of insulin yet sugars have remained high and in the 230-370 range. I recommend increasing Lantus to 40 units qhs. Add scheduled NovoLog pre-meal of 10 units tid AC. Modify SSI to moderate qAC. 3. She will need teaching on diabetes, BG monitoring, and self administration of insulin. Appreciate assistance of diabetes coordinators.  4. Continue modified carb diet.   Will follow along with you and arrange out-patient follow up when she is ready for hospital discharge.

## 2015-11-29 NOTE — Progress Notes (Signed)
Pt stable post liver biopsy, dozing at intervals, bulky dressing right below breast/rib area, c/d/i,  Daughter at bedside, report called to care nurse Baylis RN with orders and plan reviewed, will go back to unit at 1130

## 2015-11-29 NOTE — Progress Notes (Signed)
Spoke with dr. Bridgett Larsson to make aware patient has generalized edema, bun and creat results reviewed with md. Patient currently on no diuretics. Also questioned palliative care consult. Per md will wait on biopsy results. No other orders received.

## 2015-11-29 NOTE — Procedures (Signed)
Ct Liver biopsy   Complications:  None  Blood Loss: none  See dictation in canopy pacs

## 2015-11-29 NOTE — Progress Notes (Addendum)
Patient ID: Renee Wells, female   DOB: [birth year], 59 y.o.   MRN: [id number] [facility] PROGRESS NOTE  PCP: Renee Peach, MD  HPI/Subjective: Abdominal cramps, leg swelling. Status post liver biopsy this morning.  Objective: Filed Vitals:   11/29/15 1215 11/29/15 1240  BP: 138/87 145/78  Pulse: 76 80  Temp:  98.3 F (36.8 C)  Resp: 14 21    Filed Weights   11/26/15 2236 11/28/15 0458 11/29/15 0336  Weight: 120.385 kg (265 lb 6.4 oz) 121.745 kg (268 lb 6.4 oz) 121.655 kg (268 lb 3.2 oz)    ROS: Review of Systems  Constitutional: Negative for fever and chills.  Eyes: Negative for blurred vision.  Respiratory: Negative for cough and shortness of breath.   Cardiovascular: Negative for chest pain.  Gastrointestinal: Negative for nausea, vomiting, abdominal pain, diarrhea and constipation.  Genitourinary: Negative for dysuria.  Musculoskeletal: Negative for joint pain.  Neurological: Negative for dizziness and headaches.   Exam: Physical Exam  Constitutional: She is oriented to person, place, and time.  HENT:  Nose: No mucosal edema.  Mouth/Throat: No oropharyngeal exudate or posterior oropharyngeal edema.  Eyes: Conjunctivae, EOM and lids are normal. Pupils are equal, round, and reactive to light.  Neck: No JVD present. Carotid bruit is not present. No edema present. No thyroid mass and no thyromegaly present.  Cardiovascular: S1 normal and S2 normal.  Exam reveals no gallop.   No murmur heard. Pulses:      Dorsalis pedis pulses are 2+ on the right side, and 2+ on the left side.  Respiratory: No respiratory distress. She has decreased breath sounds in the right lower field and the left lower field. She has no wheezes. She has no rhonchi. She has no rales.  GI: Soft. Bowel sounds are normal. There is no tenderness.  Musculoskeletal:       Right ankle: She exhibits swelling.       Left ankle: She exhibits swelling.  Lymphadenopathy:    She has no  cervical adenopathy.  Neurological: She is alert and oriented to person, place, and time. No cranial nerve deficit.  Skin: Skin is warm. No rash noted. Nails show no clubbing.  Psychiatric: She has a normal mood and affect.    Data Reviewed: Basic Metabolic Panel:  Recent Labs Lab 11/26/15 1834 11/27/15 0546 11/27/15 1519 11/28/15 0715 11/29/15 0556  NA 144 147*  --  141 142  K 2.6* 2.9* 3.8 4.6 4.0  CL 99* 103  --  103 102  CO2 32 36*  --  31 36*  GLUCOSE 678* 386*  --  313* 372*  BUN 24* 21*  --  19 21*  CREATININE 1.19* 1.03*  --  0.97 0.98  CALCIUM 9.1 8.8*  --  8.8* 8.8*  MG 2.1  --   --   --   --    Liver Function Tests:  Recent Labs Lab 11/26/15 1834  AST 48*  ALT 72*  ALKPHOS 238*  BILITOT 1.3*  PROT 6.1*  ALBUMIN 3.2*   CBC:  Recent Labs Lab 11/26/15 1834 11/27/15 0546  WBC 6.9 6.6  HGB 9.6* 8.7*  HCT 31.5* 28.0*  MCV 75.6* 74.9*  PLT 105* 97*    CBG:  Recent Labs Lab 11/28/15 1131 11/28/15 1629 11/28/15 2109 11/29/15 0802 11/29/15 1255  GLUCAP 364* 289* 356* 340* 230*    Studies: Ct Biopsy  11/29/2015  CLINICAL DATA:  Hepatic mass EXAM: CT GUIDED CORE BIOPSY OF HEPATIC MASS  ANESTHESIA/SEDATION: NONE PROCEDURE: The procedure risks, benefits, and alternatives were explained to the patient. Questions regarding the procedure were encouraged and answered. The patient understands and consents to the procedure. The right anterior abdominal wall was prepped with chlorhexidinein a sterile fashion, and a sterile drape was applied covering the operative field. A sterile gown and sterile gloves were used for the procedure. Local anesthesia was provided with 1% Lidocaine. Utilizing CT fluoroscopic guidance a 17 gauge guiding needle was placed percutaneously into the anterior aspect of the right lobe of the liver. It was placed at the margin of the hypodense mass lesion posteriorly. Multiple 18 gauge core biopsies were then obtained. The guiding needle  was then removed in Gel-Foam placed to aid in hemostasis. No post procedure hematoma is noted. The patient tolerated the procedure well and was returned to her room in satisfactory condition. COMPLICATIONS: None immediate IMPRESSION: Successful CT-guided liver biopsy as described. Electronically Signed   By: Inez Catalina M.D.   On: 11/29/2015 13:53    Scheduled Meds: . antiseptic oral rinse  7 mL Mouth Rinse BID  . cloNIDine  0.3 mg Oral BID  . hydrALAZINE  50 mg Oral TID  . HYDROcodone-acetaminophen      . insulin aspart  0-20 Units Subcutaneous TID WC  . insulin aspart  0-5 Units Subcutaneous QHS  . insulin glargine  25 Units Subcutaneous QHS  . irbesartan  75 mg Oral Daily  . isosorbide mononitrate  60 mg Oral Daily  . labetalol  200 mg Oral BID  . potassium chloride  20 mEq Oral BID  . sodium chloride  3 mL Intravenous Q12H    Assessment/Plan:  1. Metastatic cancer, as per oncology likely liver primary. Large masses on liver, pulmonary nodules, lesion on bone and also uterus mass.  F/u alpha-fetoprotein and CEA tumor markers. s/p ultrasound guided biopsy of the liver. Follow-up pathology and the oncologist.   2. Severe hypokalemia- replaced, improved.  3. Anasarca- continue IV Lasix bid and follow-up BMP. 4. Essential hypertension, continue hypertension medications 5. Diabetes mellitus-  hemoglobin A1c is 9.5, increased Lantus to 30 units subcutaneous at bedtime and change to resistant sliding scale. Endocrinology consult.  6. Morbid obesity 7. History of stroke- may resume aspirin tomorrow.  Patient could have been hypercoagulable with metastatic cancer.   Code Status:     Code Status Orders        Start     Ordered   11/26/15 2241  Full code   Continuous     11/26/15 2240       All the records are reviewed and case discussed with Care Management/Social Workerr. Management plans discussed with the patient, her daughter and they are in agreement.  TOTAL TIME  TAKING CARE OF THIS PATIENT: 47 minutes.  Greater than 50% time was spent on coordination of care and face-to-face counseling.  POSSIBLE D/C IN 3 DAYS, DEPENDING ON CLINICAL CONDITION.  Demetrios Loll  [facility]

## 2015-11-29 NOTE — Progress Notes (Signed)
PT Cancellation Note  Patient Details Name: Renee Wells MRN: [id number] DOB: 05/30/1956   Cancelled Treatment:    Reason Eval/Treat Not Completed: Medical issues which prohibited therapy (Patient status post liver biopsy this AM; will hold this afternoon to allow recovery time and re-attempt next date as patient medically appropriate and available.)   [name] H. Owens Shark, PT, DPT, NCS 11/29/2015, 3:48 PM [phone]

## 2015-11-29 NOTE — Progress Notes (Signed)
Spoke with dr. Holley Raring to make aware patients have generalized edema, currently not on any diuretics. Made dr. Holley Raring aware dr. Bridgett Larsson want him to assess patient and orders medication as needed

## 2015-11-29 NOTE — Progress Notes (Addendum)
Inpatient Diabetes Program Recommendations  AACE/ADA: New Consensus Statement on Inpatient Glycemic Control (2015)  Target Ranges:  Prepandial:   less than 140 mg/dL      Peak postprandial:   less than 180 mg/dL (1-2 hours)      Critically ill patients:  140 - 180 mg/dL  Results for NAYSHA, SHOLL (MRN [id number]) as of 11/29/2015 08:06  Ref. Range 11/28/2015 07:47 11/28/2015 11:31 11/28/2015 16:29 11/28/2015 21:09 11/29/2015 08:02  Glucose-Capillary Latest Ref Range: 65-99 mg/dL 308 (H) 364 (H) 289 (H) 356 (H) 340 (H)   Review of Glycemic Control  Diabetes history: No Outpatient Diabetes medications: NA Current orders for Inpatient glycemic control: Lantus 25 units QHS, 0-15 units ACHS  Inpatient Diabetes Program Recommendations: Correction (SSI): Please consider increasing Novolog correction scale to Resistant scale and consider changing frequency of CBGs and Novolog to Q4H while NPO. HgbA1C: A1C 9.5% on 11/25/15. Patient is being diagnosed with new onset DM. Consult: Please consider ordering an inpatient Endocrinologist consult for recommendations to assist with glycemic control as it appears that glucose is not coming down very well with insulin as ordered. Insulin-Basal- Please consider increasing Lantus to 35 units QHS.  NOTE: Ordered Living Well with Diabetes booklet, insulin starter kit, RD consult, and patient education by bedside nursing. NURSING: Please use every patient interaction to educate patient on diabetes, how to check glucose, how to administer insulin, and allow patient to be actively engaged by allowing her to check her own glucose and self-administer insulin. Diabetes Coordinator will follow up with patient today.  At time of discharge, patient will need a prescription for glucometer, testing supplies, insulin (vial or pen), and insulin syringes or pen needles.  11/29/15@13 :50-Spoke with patient about new diabetes diagnosis. Patient states that her mother had  diabetes and had to take an oral DM medication and was eventually able to stop the oral DM medication and just manage with diet. Patient states that she has some knowledge about diabetes as she helped take care of her mother. Discussed A1C results (9.5% on 11/25/15) and explained what an A1C is, basic pathophysiology of DM Type 2, basic home care, importance of checking CBGs and maintaining good CBG control to prevent long-term and short-term complications. Explained to the patient that glucose is likely being impacted by her current issues.  Reviewed signs and symptoms of hyperglycemia and hypoglycemia along with treatment for both. Asked patient to read through Living Well with Diabetes booklet which will provide her with more knowledge on diabetes. Discussed impact of nutrition, exercise, stress, sickness, and medications on diabetes control. Patient reports that she drinks mostly water and she has always tried to eat healthy. Briefly discussed carbohydrates, carbohydrate goals per day and meal, along with portion sizes. Informed patient that a consult for the dietician has been ordered and RD will be seeing her.  Patient verbalized understanding of information discussed and she states that she has no further questions at this time related to diabetes.  Patient is very receptive to learning about diabetes. Informed patient that diabetes coordinator will plan to see her again tomorrow about insulin (vial & syringe versus insulin pens). Patient's daughter has requested that patient's son also be present for insulin education since patient lives with him and his girlfriend. Patient's daughter will talk with her brother and see if him or his girlfriend are available for education tomorrow. RNs to provide ongoing basic DM education at bedside with this patient and her family. Please engage patient to actively check blood glucose  and self-administer insulin injections.   Thanks Barnie Alderman, RN, MSN, CDE Diabetes  Coordinator Inpatient Diabetes Program [phone] (Team Pager from [place] to [place]) [phone] ([place] office) [phone] [facility] office) [phone] [facility] office)

## 2015-11-29 NOTE — Progress Notes (Signed)
Came several time to see pt today but she was in liver biopsy.  Will return tomorrow to address edema.  Agree with lasix administration for now, but will need to monitor K closely.

## 2015-11-30 ENCOUNTER — Encounter: Payer: Self-pay | Admitting: Nurse Practitioner

## 2015-11-30 DIAGNOSIS — I5031 Acute diastolic (congestive) heart failure: Secondary | ICD-10-CM

## 2015-11-30 DIAGNOSIS — C801 Malignant (primary) neoplasm, unspecified: Secondary | ICD-10-CM

## 2015-11-30 DIAGNOSIS — E876 Hypokalemia: Secondary | ICD-10-CM

## 2015-11-30 DIAGNOSIS — E119 Type 2 diabetes mellitus without complications: Secondary | ICD-10-CM

## 2015-11-30 DIAGNOSIS — R0609 Other forms of dyspnea: Secondary | ICD-10-CM

## 2015-11-30 DIAGNOSIS — R7401 Elevation of levels of liver transaminase levels: Secondary | ICD-10-CM | POA: Insufficient documentation

## 2015-11-30 DIAGNOSIS — R601 Generalized edema: Secondary | ICD-10-CM

## 2015-11-30 DIAGNOSIS — I10 Essential (primary) hypertension: Secondary | ICD-10-CM | POA: Diagnosis present

## 2015-11-30 DIAGNOSIS — R160 Hepatomegaly, not elsewhere classified: Secondary | ICD-10-CM

## 2015-11-30 DIAGNOSIS — R740 Nonspecific elevation of levels of transaminase and lactic acid dehydrogenase [LDH]: Secondary | ICD-10-CM

## 2015-11-30 DIAGNOSIS — C799 Secondary malignant neoplasm of unspecified site: Secondary | ICD-10-CM | POA: Diagnosis present

## 2015-11-30 LAB — COMPREHENSIVE METABOLIC PANEL
ALT: 49 U/L (ref 14–54)
ANION GAP: 7 (ref 5–15)
AST: 33 U/L (ref 15–41)
Albumin: 3.1 g/dL — ABNORMAL LOW (ref 3.5–5.0)
Alkaline Phosphatase: 237 U/L — ABNORMAL HIGH (ref 38–126)
BILIRUBIN TOTAL: 0.8 mg/dL (ref 0.3–1.2)
BUN: 21 mg/dL — ABNORMAL HIGH (ref 6–20)
CALCIUM: 8.8 mg/dL — AB (ref 8.9–10.3)
CO2: 38 mmol/L — ABNORMAL HIGH (ref 22–32)
Chloride: 98 mmol/L — ABNORMAL LOW (ref 101–111)
Creatinine, Ser: 0.92 mg/dL (ref 0.44–1.00)
Glucose, Bld: 263 mg/dL — ABNORMAL HIGH (ref 65–99)
POTASSIUM: 2.6 mmol/L — AB (ref 3.5–5.1)
Sodium: 143 mmol/L (ref 135–145)
TOTAL PROTEIN: 5.9 g/dL — AB (ref 6.5–8.1)

## 2015-11-30 LAB — PROTEIN ELECTRO, RANDOM URINE
ALBUMIN ELP UR: 24 %
ALPHA-2-GLOBULIN, U: 33 %
Alpha-1-Globulin, U: 3.8 %
BETA GLOBULIN, U: 28.5 %
GAMMA GLOBULIN, U: 10.8 %
M Component, Ur: 20.2 % — ABNORMAL HIGH
Total Protein, Urine: 57.5 mg/dL

## 2015-11-30 LAB — GLUCOSE, CAPILLARY
GLUCOSE-CAPILLARY: 226 mg/dL — AB (ref 65–99)
GLUCOSE-CAPILLARY: 217 mg/dL — AB (ref 65–99)
GLUCOSE-CAPILLARY: 388 mg/dL — AB (ref 65–99)
Glucose-Capillary: 278 mg/dL — ABNORMAL HIGH (ref 65–99)

## 2015-11-30 LAB — MAGNESIUM: MAGNESIUM: 2 mg/dL (ref 1.7–2.4)

## 2015-11-30 LAB — CBC WITH DIFFERENTIAL/PLATELET
BASOS PCT: 0 %
Basophils Absolute: 0 10*3/uL (ref 0–0.1)
Eosinophils Absolute: 0 10*3/uL (ref 0–0.7)
Eosinophils Relative: 0 %
HEMATOCRIT: 29.3 % — AB (ref 35.0–47.0)
Hemoglobin: 8.8 g/dL — ABNORMAL LOW (ref 12.0–16.0)
LYMPHS ABS: 0.3 10*3/uL — AB (ref 1.0–3.6)
LYMPHS PCT: 4 %
MCH: 22.5 pg — AB (ref 26.0–34.0)
MCHC: 29.9 g/dL — AB (ref 32.0–36.0)
MCV: 75.1 fL — AB (ref 80.0–100.0)
MONO ABS: 0.4 10*3/uL (ref 0.2–0.9)
MONOS PCT: 7 %
NEUTROS ABS: 5.7 10*3/uL (ref 1.4–6.5)
Neutrophils Relative %: 89 %
Platelets: 113 10*3/uL — ABNORMAL LOW (ref 150–440)
RBC: 3.9 MIL/uL (ref 3.80–5.20)
RDW: 18.8 % — AB (ref 11.5–14.5)
WBC: 6.5 10*3/uL (ref 3.6–11.0)

## 2015-11-30 LAB — POTASSIUM: POTASSIUM: 3.6 mmol/L (ref 3.5–5.1)

## 2015-11-30 MED ORDER — INSULIN ASPART 100 UNIT/ML ~~LOC~~ SOLN
16.0000 [IU] | Freq: Three times a day (TID) | SUBCUTANEOUS | Status: DC
  Administered 2015-12-01 (×3): 16 [IU] via SUBCUTANEOUS
  Filled 2015-11-30 (×3): qty 16

## 2015-11-30 MED ORDER — HYDRALAZINE HCL 20 MG/ML IJ SOLN: 10.0000 mg | Freq: Four times a day (QID) | INTRAMUSCULAR | Status: DC | PRN

## 2015-11-30 MED ORDER — AMLODIPINE BESYLATE 10 MG PO TABS
10.0000 mg | ORAL_TABLET | Freq: Every day | ORAL | Status: DC
  Administered 2015-11-30: 10 mg via ORAL
  Filled 2015-11-30: qty 1

## 2015-11-30 MED ORDER — POTASSIUM CHLORIDE CRYS ER 20 MEQ PO TBCR
40.0000 meq | EXTENDED_RELEASE_TABLET | Freq: Once | ORAL | Status: AC
  Administered 2015-11-30: 40 meq via ORAL
  Filled 2015-11-30: qty 2

## 2015-11-30 MED ORDER — SPIRONOLACTONE 25 MG PO TABS
25.0000 mg | ORAL_TABLET | Freq: Every day | ORAL | Status: DC
Start: 2015-11-30 — End: 2015-12-03
  Administered 2015-11-30 – 2015-12-03 (×4): 25 mg via ORAL
  Filled 2015-11-30 (×4): qty 1

## 2015-11-30 MED ORDER — POTASSIUM CHLORIDE 10 MEQ/100ML IV SOLN
10.0000 meq | INTRAVENOUS | Status: AC
  Administered 2015-11-30 (×4): 10 meq via INTRAVENOUS
  Filled 2015-11-30 (×4): qty 100

## 2015-11-30 MED ORDER — ENOXAPARIN SODIUM 40 MG/0.4ML ~~LOC~~ SOLN
40.0000 mg | Freq: Two times a day (BID) | SUBCUTANEOUS | Status: DC
  Administered 2015-11-30 – 2015-12-03 (×6): 40 mg via SUBCUTANEOUS
  Filled 2015-11-30 (×6): qty 0.4

## 2015-11-30 NOTE — Consult Note (Signed)
Cardiology Consult    Patient ID: NIAJAH SIPOS MRN: [id number], DOB/AGE: 02-13-1956   Admit date: 11/26/2015 Date of Consult: 11/30/2015  Primary Physician: Sharyne Peach, MD Primary Cardiologist: New Requesting Provider: Bridgett Larsson  Patient Profile    59 y/o female with a h/o difficult to manage HTN and hypokalemia who was admitted with 12/9 due to dyspnea and anasarca and has been found to have metastatic cancer involving the liver, lungs, iliac crest, and uterus.  Past Medical History   Past Medical History  Diagnosis Date  . Stroke [facility])     a. 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  . Malignant hypertension     a. Admitted 06/2015 and 11/2015;  b. 06/2015 nl aldosterone/renin activity;  c. Polypharmacy.  [name] HLD (hyperlipidemia)   . Morbid obesity ([place])   . Anasarca     a. 11/2015 Echo: EF 55-65%, triv TR/MR.  [name] Hypokalemia   . Type II diabetes mellitus ([place])     a. 11/2015 A1c 9.5.  . Metastatic cancer ([place])     a. 11/2015 CT abd/pelvis: large R hepatic lobe mass extending inf to pararenal space on R. Complete occlusion of R portal vein. L hepatic lobe lesion. Bilat LL pulm mets. Skeletal mets 2/ Fx of R iliac wing. Enhancing uterine fundal mass.    Past Surgical History  Procedure Laterality Date  . Cesarean section       Allergies  Allergies  Allergen Reactions  . Penicillins Itching    History of Present Illness    59 y/o female with the above complex problem list.  She has no prior h/o CAD but did suffer a stroke in 2013 and has had difficult to manage HTN since.  She was admitted in July of this year after being found to be markedly hypokalemic by her PCP.  She was also hypertensive with pressures in the 220's.  Echo showed nl LV fxn.  Aldosterone/renin w/u was unrevealing.  Meds were adjusted and she was subsequently discharged.  Of note, she was hyperglycemic at the time however A1c was normal @ 5.8.  Following that admission, she did fairly well, saying  that she was able to ambulate with a cane w/o difficulty.  She lives locally with her son and his fiancee and she helped to keep house and prepared meals.  Her PCP d/c'd her HCTZ following her hospitalization and following that, she began to note mild lower ext edema.  This progressed and by early November, she was also experiencing DOE.  She was seen in the [facility] ED on 11/19 secondary to right hip pain that started after using the restroom and "turning [her] hip the wrong way."  Xray showed age-indeterminate avulsion injury.  Pain was managed and she was d/c'd home.  She was hypertensive in ED and was advised to f/u with PCP.  Following d/c, she was using a [place] but as edema and dyspnea worsened, she required a wheelchair.  She saw her PCP on 12/3 and was placed on spironolactone but after two doses, she had significant nausea and it was d/c'd.    Over the past two weeks, dyspnea and edema progressively worsened and her son brought her to the [facility] ED on 12/9.  In the ED, she was found to be hypertensive.  CXR showed pulm venous HTN w/o overt edema.  LFT's were mildly elevated.  K was 2.6. Trop nl.  She was admitted by medicine for diuresis and BP mgmt.  Abd u/s was  performed 2/2 elevated LFT's and revealed a large hepatic mass.  F/U CT revealed a large R hepatic lobe mass extending inf to pararenal space on R, Complete occlusion of R portal vein, L hepatic lobe lesion, bilat LL pulm mets, skeletal mets 2/ Fx of R iliac wing (? same finding prev called avulsion), and enhancing uterine fundal mass.  She has been seen by oncology and is now s/p CT guided liver Bx on 12/12.    Echo showed nl LV fxn.  She has been responsive to IV diuresis and is minus 5.8 L this admission.  Wt is down 8 lbs since 12/11 and if accurate, 15 lbs since admission.  She has required significant potassium supplementation and nephrology has been following.  BP's have been variable, ranging from the high 130's to 194/90 on the am of  12/13. We have been asked to eval 2/2 ongoing edema and concern for diast chf.  She is breathing much better and notes significant improvement in both LEE and appetite.  Inpatient Medications    . antiseptic oral rinse  7 mL Mouth Rinse BID  . aspirin  81 mg Oral Daily  . cloNIDine  0.3 mg Oral BID  . enoxaparin (LOVENOX) injection  40 mg Subcutaneous Q12H  . furosemide  40 mg Intravenous Q12H  . hydrALAZINE  50 mg Oral TID  . insulin aspart  0-15 Units Subcutaneous TID WC  . insulin aspart  0-5 Units Subcutaneous QHS  . insulin aspart  8 Units Subcutaneous TID WC  . insulin glargine  40 Units Subcutaneous QHS  . irbesartan  75 mg Oral Daily  . isosorbide mononitrate  60 mg Oral Daily  . labetalol  200 mg Oral BID  . potassium chloride  10 mEq Intravenous Q1 Hr x 4  . potassium chloride  20 mEq Oral BID  . sodium chloride  3 mL Intravenous Q12H  . spironolactone  25 mg Oral Daily    Family History    Family History  Problem Relation Age of Onset  . Heart disease Mother   . Hypertension Mother   . Diabetes Mother   . Diabetes Sister   . Hyperlipidemia Sister     Social History    Social History   Social History  . Marital Status: Single    Spouse Name: N/A  . Number of Children: N/A  . Years of Education: N/A   Occupational History  . Not on file.   Social History Main Topics  . Smoking status: Never Smoker   . Smokeless tobacco: Not on file  . Alcohol Use: No     Comment: occasional  . Drug Use: No  . Sexual Activity: Not on file   Other Topics Concern  . Not on file   Social History Narrative   Lives in [place] with son and his fiancee.  Up until a few wks ago, was able to get around with cane, then moved to [place], and more recently w/c 2/2 progressive dyspnea and lower ext edema.     Review of Systems    General:  No chills, fever, night sweats or weight changes.  Cardiovascular:  No chest pain, +++ dyspnea on exertion, +++ edema, +++ orthopnea,  no palpitations, paroxysmal nocturnal dyspnea. Dermatological: No rash, lesions/masses Respiratory: No cough, +++ dyspnea Urologic: No hematuria, dysuria Abdominal:   +++ nausea, anorexia, early satiety prior to admission.  No vomiting, diarrhea, bright red blood per rectum, melena, or hematemesis Neurologic:  No visual changes, +++  chronic left sided wkns - LE worse than UE since stroke in 2013.  No changes in mental status. All other systems reviewed and are otherwise negative except as noted above.  Physical Exam    Blood pressure 148/74, pulse 81, temperature 97.9 F (36.6 C), temperature source Oral, resp. rate 18, height '5\' 5"'$  (1.651 m), weight 260 lb 3.2 oz (118.026 kg), SpO2 100 %.  General: Pleasant, NAD Psych: Normal affect. Neuro: Alert and oriented X 3. Moves all extremities spontaneously - left sided wkns. HEENT: Normal  Neck: Supple without bruits.  Obese, difficult to gauge jvp. Lungs:  Resp regular and unlabored, bibasilar crackles. Heart: RRR no s3, s4, or murmurs. Abdomen: Soft, non-tender, non-distended, BS + x 4.  Extremities: No clubbing, cyanosis.  2+ bilat LE edema lower calf. DP/PT/Radials 2+ and equal bilaterally.  Labs    Lab Results  Component Value Date   WBC 6.5 11/30/2015   HGB 8.8* 11/30/2015   HCT 29.3* 11/30/2015   MCV 75.1* 11/30/2015   PLT 113* 11/30/2015     Recent Labs Lab 11/30/15 0637  NA 143  K 2.6*  CL 98*  CO2 38*  BUN 21*  CREATININE 0.92  CALCIUM 8.8*  PROT 5.9*  BILITOT 0.8  ALKPHOS 237*  ALT 49  AST 33  GLUCOSE 263*   Lab Results  Component Value Date   CHOL 194 06/07/2012   HDL 87* 06/07/2012   LDLCALC 94 06/07/2012   TRIG 67 06/07/2012     Radiology Studies    Ct Pelvis W Contrast  12/20/2015  CLINICAL DATA:  Hepatic mass discovered on ultrasound. Concern for hepatic malignancy. EXAM: CT ABDOMEN WITHOUT AND WITH CONTRAST CT pelvis with contrast. TECHNIQUE: Multidetector CT imaging of the abdomen was  performed following the standard protocol before and following the bolus administration of intravenous contrast. CT the pelvis performed following IV contrast. CONTRAST:  159m OMNIPAQUE IOHEXOL 350 MG/ML SOLN COMPARISON:  Ultrasound 1Jan 02, 2017 radiograph 11/26/2015 FINDINGS: Lower chest: Multiple round pulmonary nodules at the lung bases of varying size consistent with lung metastasis. Example lesion in the RIGHT lower lobe measures 10 mm image 10, series 3. Example lesion LEFT lower lobe measures 11 mm on image 4, series 3. Hepatobiliary: Large RIGHT hepatic lobe mass occupying the near entirety of the posterior RIGHT hepatic lobe (segments 5, 6 and 7). Mass measures 12 cm x 12 cm in axial dimension and 17 cm in craniocaudad dimension. RIGHT hepatic lobe mass is partially exophytic and extends inferiorly and compresses the RIGHT kidney and extend into the space between the RIGHT kidney and the IVC. Several nodular extension this at this level (image 112, series 8). There is a smaller lesion in the LEFT hepatic lobe measuring 2.2 cm on image 48, series 8. The LEFT portal vein is patent. 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 Main portal vein is patent. Pancreas: Pancreas is normal. No ductal dilatation. No pancreatic inflammation. Spleen: Normal spleen Adrenals/urinary tract: The the RIGHT adrenal gland is not identified secondary to the hepatic mass. The RIGHT kidney is depressed inferiorly. The LEFT kidney adrenal gland appear normal. Stomach/Bowel: Stomach, small bowel, appendix, and cecum are normal. The colon and rectosigmoid colon are normal. Vascular/Lymphatic: No clear lymphadenopathy. Nodule lesion between the IVC in the RIGHT kidney on image 112 is felt to be extension of the hepatic tumor itself. No pelvic lymphadenopathy. Reproductive: The fundus of the uterus has enhancing mass measuring 7.8 x 9.2 by 6.7 cm. No adnexal abnormality is obvious. Other:  No free fluid. Musculoskeletal: There is an  aggressive lesion along the anterior margin of the RIGHT iliac bone. This lesion or erupts through the cortex and measures 4.0 by 4.6 cm on image 164, series 8. The sclerosis of the SI joints. IMPRESSION: 1. Large RIGHT hepatic lobe mass which extends inferior to the pararenal space on the RIGHT. 2. 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 LEFT hepatic lobe lesion additionally. 3. Bilateral lower lobe pulmonary metastasis. 4. 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 5. Enhancing mass in the uterine fundus is likely a benign leiomyoma. Cannot exclude leiomyosarcoma given the extensive metastatic disease in no clear primary. 6. Differential considerations would include primary hepatocellular carcinoma versus metastatic disease from unknown primary. Recommend biopsying liver mass and FDG PET scan. Electronically Signed   By: Suzy Bouchard M.D.   On: 11/27/2015 14:21   Ct Abd Wo & W Cm  11/27/2015  CLINICAL DATA:  Hepatic mass discovered on ultrasound. Concern for hepatic malignancy. EXAM: CT ABDOMEN WITHOUT AND WITH CONTRAST CT pelvis with contrast. TECHNIQUE: Multidetector CT imaging of the abdomen was performed following the standard protocol before and following the bolus administration of intravenous contrast. CT the pelvis performed following IV contrast. CONTRAST:  131m OMNIPAQUE IOHEXOL 350 MG/ML SOLN COMPARISON:  Ultrasound 11/27/2015, radiograph 11/26/2015 FINDINGS: Lower chest: Multiple round pulmonary nodules at the lung bases of varying size consistent with lung metastasis. Example lesion in the RIGHT lower lobe measures 10 mm image 10, series 3. Example lesion LEFT lower lobe measures 11 mm on image 4, series 3. Hepatobiliary: Large RIGHT hepatic lobe mass occupying the near entirety of the posterior RIGHT hepatic lobe (segments 5, 6 and 7). Mass measures 12 cm x 12 cm in axial dimension and 17 cm in craniocaudad dimension. RIGHT hepatic lobe mass is partially exophytic and  extends inferiorly and compresses the RIGHT kidney and extend into the space between the RIGHT kidney and the IVC. Several nodular extension this at this level (image 112, series 8). There is a smaller lesion in the LEFT hepatic lobe measuring 2.2 cm on image 48, series 8. The LEFT portal vein is patent. 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 Main portal vein is patent. Pancreas: Pancreas is normal. No ductal dilatation. No pancreatic inflammation. Spleen: Normal spleen Adrenals/urinary tract: The the RIGHT adrenal gland is not identified secondary to the hepatic mass. The RIGHT kidney is depressed inferiorly. The LEFT kidney adrenal gland appear normal. Stomach/Bowel: Stomach, small bowel, appendix, and cecum are normal. The colon and rectosigmoid colon are normal. Vascular/Lymphatic: No clear lymphadenopathy. Nodule lesion between the IVC in the RIGHT kidney on image 112 is felt to be extension of the hepatic tumor itself. No pelvic lymphadenopathy. Reproductive: The fundus of the uterus has enhancing mass measuring 7.8 x 9.2 by 6.7 cm. No adnexal abnormality is obvious. Other: No free fluid. Musculoskeletal: There is an aggressive lesion along the anterior margin of the RIGHT iliac bone. This lesion or erupts through the cortex and measures 4.0 by 4.6 cm on image 164, series 8. The sclerosis of the SI joints. IMPRESSION: 1. Large RIGHT hepatic lobe mass which extends inferior to the pararenal space on the RIGHT. 2. 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 LEFT hepatic lobe lesion additionally. 3. Bilateral lower lobe pulmonary metastasis. 4. 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 5. Enhancing mass in the uterine fundus is likely a benign leiomyoma. Cannot exclude leiomyosarcoma given the extensive metastatic disease in no  clear primary. 6. Differential considerations would include primary hepatocellular carcinoma versus metastatic disease from unknown primary. Recommend biopsying liver mass  and FDG PET scan. Electronically Signed   By: Suzy Bouchard M.D.   On: 11/27/2015 14:21   Ct Biopsy  11/29/2015  CLINICAL DATA:  Hepatic mass EXAM: CT GUIDED CORE BIOPSY OF HEPATIC MASS ANESTHESIA/SEDATION: NONE PROCEDURE: The procedure risks, benefits, and alternatives were explained to the patient. Questions regarding the procedure were encouraged and answered. The patient understands and consents to the procedure. The right anterior abdominal wall was prepped with chlorhexidinein a sterile fashion, and a sterile drape was applied covering the operative field. A sterile gown and sterile gloves were used for the procedure. Local anesthesia was provided with 1% Lidocaine. Utilizing CT fluoroscopic guidance a 17 gauge guiding needle was placed percutaneously into the anterior aspect of the right lobe of the liver. It was placed at the margin of the hypodense mass lesion posteriorly. Multiple 18 gauge core biopsies were then obtained. The guiding needle was then removed in Gel-Foam placed to aid in hemostasis. No post procedure hematoma is noted. The patient tolerated the procedure well and was returned to her room in satisfactory condition. COMPLICATIONS: None immediate IMPRESSION: Successful CT-guided liver biopsy as described. Electronically Signed   By: Inez Catalina M.D.   On: 11/29/2015 13:53   Dg Chest Portable 1 View  11/26/2015  CLINICAL DATA:  59 year old with progressively worsening shortness of breath and bilateral upper extremity and lower extremity edema over the past few days. EXAM: PORTABLE CHEST 1 VIEW COMPARISON:  06/03/2014. FINDINGS: Marked elevation of the right hemidiaphragm with scar/atelectasis at the right lung base. Cardiac silhouette mildly to moderately enlarged. Pulmonary venous hypertension without overt edema. No visible pleural effusions. IMPRESSION: 1. Cardiomegaly. Pulmonary venous hypertension without overt edema currently. 2. Elevation of the right hemidiaphragm with  scar/atelectasis at the right lung base. Electronically Signed   By: Evangeline Dakin M.D.   On: 11/26/2015 17:56   Dg Hand Complete Left  11/26/2015  CLINICAL DATA:  Fall onto left hand. Left hand swelling and pain. Initial encounter. EXAM: LEFT HAND - COMPLETE 3+ VIEW COMPARISON:  None. FINDINGS: There is no evidence of fracture or dislocation. Mild degenerative spurring is seen involving the metacarpophalangeal joint of the thumb. No other bone abnormality identified. Marked soft tissue swelling is seen involving the dorsum of the hand and the wrist. IMPRESSION: Marked hand and wrist soft tissue swelling. No evidence of fracture or dislocation. Electronically Signed   By: Earle Gell M.D.   On: 11/26/2015 20:04   Dg Hip Unilat  With Pelvis 2-3 Views Right  11/06/2015  CLINICAL DATA:  Chronic hip pain exacerbated by activity. Pain for 2 weeks. EXAM: DG HIP (WITH OR WITHOUT PELVIS) 2-3V RIGHT COMPARISON:  None. FINDINGS: The cortical margins of the bony pelvis are intact. No fracture. Pubic symphysis and sacroiliac joints are congruent. Both femoral heads are well-seated in the respective acetabula. Mild-to-moderate degenerative change of both hips with acetabular osteophytes. 6.7 cm elongated ossified density adjacent to the right anterior superior iliac spine is consistent with an avulsion injury, of indeterminate age. IMPRESSION: 1. Age-indeterminate avulsion injury from the right anterior superior iliac spine. 2. 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 Electronically Signed   By: Jeb Levering M.D.   On: 11/06/2015 02:00   US Abdomen Limited Ruq  11/27/2015  CLINICAL DATA:  Elevated liver enzymes EXAM: US ABDOMEN LIMITED - RIGHT UPPER QUADRANT COMPARISON:  None.  FINDINGS: Gallbladder: The gallbladder is predominantly contracted. No gallbladder wall thickening or pericholecystic fluid. Negative sonographic Murphy's sign per Common bile duct: Diameter: Normal at 4 mm. Liver: There is a  large round heterogeneous solid-appearing mass within the RIGHT hepatic lobe measuring 18 by 11 by 12. This mass bulges the liver capsule. Additional smaller lesions noted in the LEFT hepatic lobe measuring up to 3.5 cm. No biliary duct dilatation. Mild heterogeneity of the liver parenchyma. IMPRESSION: 1. 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 Recommend CT of the abdomen and pelvis with contrast for further evaluation. 2. Contracted gallbladder without evidence of cholecystitis. These results will be called to the ordering clinician or representative by the Radiologist Assistant, and communication documented in the PACS or zVision Dashboard. Electronically Signed   By: Suzy Bouchard M.D.   On: 11/27/2015 09:35   2D Echocardiogram 12.10.2016  Study Conclusions  - Left ventricle: Systolic function was normal. The estimated ejection fraction was in the range of 55% to 65%. _____________   ECG & Cardiac Imaging    RSR, 94, PVC, LAE, delayed R progression.  Assessment & Plan    1.  Acute Diastolic CHF/Anasarca:  Pt was admitted 12/9 with a several month h/o progressive LEE and wt gain (admitted @ 28 - was 248 06/2015) that became associated with worsening DOE, orthopnea, anorexia, and early satiety beginning approx 10 days prior to admission.  Echo this admission has shown nl LV fxn.  No mention of diast dysfxn.  Only trivial MR/TR.  She has a h/o difficult to manage HTN and was hypertensive on admission (184/99) with variable BP's since admission (130's to 190's).  She has had clinical improvement but remains volume overloaded and presumably is still 10-12 lbs above baseline wt.  Nephrology has been following and managing diuretics in setting of significant hypokalemia and now mild hypochloremia.  BUN/Creat stable.  Bicarb bumping (38).  Cont IV lasix as Rx by nephrology.  Cont aggressive BP mgmt with bb,  arb, spriro (tolerating thus far after one dose - suspect nausea more a factor of CHF/passive congestion), hydralazine, nitrate, clonidine.  Prev on amlodipine - d/c'd 2/2 potential contribution to LEE.  Hard to know what role metastatic CA with liver mass and R portal vein occlusion is playing in anasarca.  2.  Metastatic Cancer: LFT elevation lead to abd u/s revealing large hepatic mass w/ subsequent CT of abd/pelvis with findings of bilat hepatic lobe masses, occlusion of R portal vein, bilat LL pulm mets, right iliac wing lesion w/ fx, and uterine fundal mass.  S/p liver Bx on 12/12 - path pending.  Oncology following.  3.  Malignant Hypertension:  See # 1.  BP's variable.  Cont current meds.  Note nephrology goal to wean of hydralazine and clonidine (12/11 note).  She is on low dose ARB and relatively low dose labetalol @ this time.  Could begin to titrate these medications while weaning off others.  4.  Newly Dx Type II DM:  Lantus, novolog, and SSI started by endocrine.  5.  Hypokalemia:  Supplementation ordered per medicine/nephrology.  Prior aldosterone/renin w/u unrevealing.    6.  Microcytic anemia:  H/H down since July - currently 8.8/29.3 (was 12.8/40.3).  Microcytic/hypochromic then and now.  Low nl serum iron, nl ferritin.  Signed, Murray Hodgkins, NP 11/30/2015, 4:06 PM

## 2015-11-30 NOTE — Progress Notes (Signed)
Initial Nutrition Assessment    INTERVENTION:   Meals and Snacks: Cater to patient preferences Education: provided written and brief education on diabetic diet; pt would prefer to read the material first and then have RD follow-up and answer questions. Did briefly describe basics of carb controlled diabetic diet   NUTRITION DIAGNOSIS:   Food and nutrition related knowledge deficit related to  (new dx DM) as evidenced by  (consult for education).  GOAL:   Patient will meet greater than or equal to 90% of their needs  MONITOR:    (Energy Intake, Anthropometrics, Electrolyte/Renal Profile, Glucose Profile, Digestive System)  REASON FOR ASSESSMENT:   Consult Diet education  ASSESSMENT:    Pt admitted with SOB and generalized edema, hyperglycemia with new onset DM, pt with liver mass, likely metastatic cancer with pulmonary nodules, bone lesions, uterine mass. S/p liver bopsy  Past Medical History  Diagnosis Date  . Stroke ([place])   . Hypertension   . HLD (hyperlipidemia)   . Obesity      Diet Order:  Diet heart healthy/carb modified Room service appropriate?: Yes; Fluid consistency:: Thin   Energy Intake: appetite very good, eating 100% of meal trays  Glucose Profile:   Recent Labs  11/29/15 2117 11/30/15 0756 11/30/15 1146  GLUCAP 299* 226* 278*   Lab Results  Component Value Date   HGBA1C 9.5* 11/25/2015   Electrolyte and Renal Profile:  Recent Labs Lab 11/26/15 1834  11/28/15 0715 11/29/15 0556 11/30/15 0637  BUN 24*  < > 19 21* 21*  CREATININE 1.19*  < > 0.97 0.98 0.92  NA 144  < > 141 142 143  K 2.6*  < > 4.6 4.0 2.6*  MG 2.1  --   --   --  2.0  < > = values in this interval not displayed. Protein Profile:   Recent Labs Lab 11/26/15 1834 11/30/15 0637  ALBUMIN 3.2* 3.1*   Nutritional Anemia Profile:  CBC Latest Ref Rng 11/30/2015 11/27/2015 11/26/2015  WBC 3.6 - 11.0 K/uL 6.5 6.6 6.9  Hemoglobin 12.0 - 16.0 g/dL 8.8(L) 8.7(L) 9.6(L)   Hematocrit 35.0 - 47.0 % 29.3(L) 28.0(L) 31.5(L)  Platelets 150 - 440 K/uL 113(L) 97(L) 105(L)   Nutrition Focused Physical Exam: Nutrition-Focused physical exam completed. Findings are no fat depletion, no muscle depletion, and moderate edema.    Height:   Ht Readings from Last 1 Encounters:  11/26/15 '5\' 5"'$  (1.651 m)    Weight: weight gain recently  Wt Readings from Last 1 Encounters:  11/30/15 260 lb 3.2 oz (118.026 kg)   Wt Readings from Last 10 Encounters:  11/30/15 260 lb 3.2 oz (118.026 kg)  06/25/15 249 lb (112.946 kg)    BMI:  Body mass index is 43.3 kg/(m^2).  Estimated Nutritional Needs:   Kcal:  1425-1710 mL   Protein:  63-80 g (1.1-1.4 g/kg)   Fluid:  1425-1710 mL     MODERATE Care Level  Kerman Passey MS, RD, LDN [phone] Pager

## 2015-11-30 NOTE — Progress Notes (Signed)
MEDICATION RELATED CONSULT NOTE - INITIAL   Pharmacy Consult for K replacement Indication: hypokalemia  Allergies  Allergen Reactions  . Penicillins Itching    Patient Measurements: Height: '5\' 5"'$  (165.1 cm) Weight: 260 lb 3.2 oz (118.026 kg) IBW/kg (Calculated) : 57   Vital Signs: Temp: 98.6 F (37 C) (12/13 2005) Temp Source: Oral (12/13 2005) BP: 150/68 mmHg (12/13 2005) Pulse Rate: 95 (12/13 2005) Intake/Output from previous day: 12/12 0701 - 12/13 0700 In: 640 [P.O.:640] Out: 3275 [Urine:3275] Intake/Output from this shift:    Labs:  Recent Labs  11/28/15 0715 11/29/15 0556 11/30/15 0637  WBC  --   --  6.5  HGB  --   --  8.8*  HCT  --   --  29.3*  PLT  --   --  113*  CREATININE 0.97 0.98 0.92  MG  --   --  2.0  ALBUMIN  --   --  3.1*  PROT  --   --  5.9*  AST  --   --  33  ALT  --   --  49  ALKPHOS  --   --  237*  BILITOT  --   --  0.8   Estimated Creatinine Clearance: 83.6 mL/min (by C-G formula based on Cr of 0.92).   Microbiology: No results found for this or any previous visit (from the past 720 hour(s)).  Medical History: Past Medical History  Diagnosis Date  . Stroke [facility])     a. 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  . Malignant hypertension     a. Admitted 06/2015 and 11/2015;  b. 06/2015 nl aldosterone/renin activity;  c. Polypharmacy.  [name] HLD (hyperlipidemia)   . Morbid obesity ([place])   . Anasarca     a. 11/2015 Echo: EF 55-65%, triv TR/MR.  [name] Hypokalemia   . Type II diabetes mellitus ([place])     a. 11/2015 A1c 9.5.  . Metastatic cancer ([place])     a. 11/2015 CT abd/pelvis: large R hepatic lobe mass extending inf to pararenal space on R. Complete occlusion of R portal vein. L hepatic lobe lesion. Bilat LL pulm mets. Skeletal mets 2/ Fx of R iliac wing. Enhancing uterine fundal mass.     Assessment: 59 yo female with hypokalemia K 2.6 this AM on KCl 20 mEq PO BID s/p additional 40 mEq PO x1 and KCl 10 mEq IV x4 runs  today Recheck K at 1940 = 3.6 before last dose of IV 10 mEq given at 1944  12/13 AM Mag 2.0  Goal of Therapy:  K 3.5-5.0  Plan:  Will continue current orders for KCl 20 mEq PO BID No additional supplementation needed at this time F/u BMET in AM  Pharmacy will continue to follow.   Rayna Sexton L 11/30/2015,8:28 PM

## 2015-11-30 NOTE — Progress Notes (Addendum)
Patient ID: Renee Wells, female   DOB: 1956/03/29, 59 y.o.   MRN: [id number] [facility] PROGRESS NOTE  PCP: Sharyne Peach, MD  HPI/Subjective: Abdominal cramps, better leg swelling. Shortness of breath on oxygen 2 L by nasal cannula. Objective: Filed Vitals:   11/30/15 0725 11/30/15 1117  BP: 167/92 148/74  Pulse: 86 81  Temp:  97.9 F (36.6 C)  Resp: 20 18    Filed Weights   11/28/15 0458 11/29/15 0336 11/30/15 0528  Weight: 121.745 kg (268 lb 6.4 oz) 121.655 kg (268 lb 3.2 oz) 118.026 kg (260 lb 3.2 oz)    ROS: ROS Exam: Physical Exam  Constitutional: She is oriented to person, place, and time.  HENT:  Nose: No mucosal edema.  Mouth/Throat: No oropharyngeal exudate or posterior oropharyngeal edema.  Eyes: Conjunctivae, EOM and lids are normal. Pupils are equal, round, and reactive to light.  Neck: No JVD present. Carotid bruit is not present. No edema present. No thyroid mass and no thyromegaly present.  Cardiovascular: S1 normal and S2 normal.  Exam reveals no gallop.   No murmur heard. Pulses:      Dorsalis pedis pulses are 2+ on the right side, and 2+ on the left side.  Respiratory: No respiratory distress. She has no decreased breath sounds. She has no wheezes. She has no rhonchi. She has no rales.  GI: Soft. Bowel sounds are normal. There is no tenderness.  Musculoskeletal:       Right ankle: She exhibits swelling.       Left ankle: She exhibits swelling.  Lymphadenopathy:    She has no cervical adenopathy.  Neurological: She is alert and oriented to person, place, and time. No cranial nerve deficit.  Skin: Skin is warm. No rash noted. Nails show no clubbing.  Psychiatric: She has a normal mood and affect.    Data Reviewed: Basic Metabolic Panel:  Recent Labs Lab 11/26/15 1834 11/27/15 0546 11/27/15 1519 11/28/15 0715 11/29/15 0556 11/30/15 0637  NA 144 147*  --  141 142 143  K 2.6* 2.9* 3.8 4.6 4.0 2.6*  CL 99* 103  --  103 102  98*  CO2 32 36*  --  31 36* 38*  GLUCOSE 678* 386*  --  313* 372* 263*  BUN 24* 21*  --  19 21* 21*  CREATININE 1.19* 1.03*  --  0.97 0.98 0.92  CALCIUM 9.1 8.8*  --  8.8* 8.8* 8.8*  MG 2.1  --   --   --   --  2.0   Liver Function Tests:  Recent Labs Lab 11/26/15 1834 11/30/15 0637  AST 48* 33  ALT 72* 49  ALKPHOS 238* 237*  BILITOT 1.3* 0.8  PROT 6.1* 5.9*  ALBUMIN 3.2* 3.1*   CBC:  Recent Labs Lab 11/26/15 1834 11/27/15 0546 11/30/15 0637  WBC 6.9 6.6 6.5  NEUTROABS  --   --  5.7  HGB 9.6* 8.7* 8.8*  HCT 31.5* 28.0* 29.3*  MCV 75.6* 74.9* 75.1*  PLT 105* 97* 113*    CBG:  Recent Labs Lab 11/29/15 1255 11/29/15 1620 11/29/15 2117 11/30/15 0756 11/30/15 1146  GLUCAP 230* 297* 299* 226* 278*    Studies: Ct Biopsy  11/29/2015  CLINICAL DATA:  Hepatic mass EXAM: CT GUIDED CORE BIOPSY OF HEPATIC MASS ANESTHESIA/SEDATION: NONE PROCEDURE: The procedure risks, benefits, and alternatives were explained to the patient. Questions regarding the procedure were encouraged and answered. The patient understands and consents to the procedure. The right  anterior abdominal wall was prepped with chlorhexidinein a sterile fashion, and a sterile drape was applied covering the operative field. A sterile gown and sterile gloves were used for the procedure. Local anesthesia was provided with 1% Lidocaine. Utilizing CT fluoroscopic guidance a 17 gauge guiding needle was placed percutaneously into the anterior aspect of the right lobe of the liver. It was placed at the margin of the hypodense mass lesion posteriorly. Multiple 18 gauge core biopsies were then obtained. The guiding needle was then removed in Gel-Foam placed to aid in hemostasis. No post procedure hematoma is noted. The patient tolerated the procedure well and was returned to her room in satisfactory condition. COMPLICATIONS: None immediate IMPRESSION: Successful CT-guided liver biopsy as described. Electronically Signed   By:  Inez Catalina M.D.   On: 11/29/2015 13:53    Scheduled Meds: . antiseptic oral rinse  7 mL Mouth Rinse BID  . aspirin  81 mg Oral Daily  . cloNIDine  0.3 mg Oral BID  . furosemide  40 mg Intravenous Q12H  . hydrALAZINE  50 mg Oral TID  . insulin aspart  0-15 Units Subcutaneous TID WC  . insulin aspart  0-5 Units Subcutaneous QHS  . insulin aspart  8 Units Subcutaneous TID WC  . insulin glargine  40 Units Subcutaneous QHS  . irbesartan  75 mg Oral Daily  . isosorbide mononitrate  60 mg Oral Daily  . labetalol  200 mg Oral BID  . potassium chloride  10 mEq Intravenous Q1 Hr x 4  . potassium chloride  20 mEq Oral BID  . sodium chloride  3 mL Intravenous Q12H  . spironolactone  25 mg Oral Daily    Assessment/Plan:  1. Metastatic cancer, as per oncology likely liver primary. Large masses on liver, pulmonary nodules, lesion on bone and also uterus mass.  F/u alpha-fetoprotein and CEA tumor markers. s/p ultrasound guided biopsy of the liver. Follow-up pathology and the oncologist.  2. Severe hypokalemia- give potassium supplement, follow-up BMP. Magnesium level is normal. 3. Anasarca- possible due to acute diastolic CHF, cardiology consult, continue IV Lasix bid and follow-up BMP. 4. Accelerated hypertension, continue hypertension medications, resume home spironolactone. 5. Diabetes mellitus-  hemoglobin A1c is 9.5, Per Dr. Gabriel Carina,  Lantus 40 units subcutaneous at bedtime, novolog 8 units AC and sliding scale.  6. Morbid obesity 7. History of stroke- resume aspirin today.  8. Thrombocytopenia. Stable. 9. Anemia of chronic disease. Hemoglobin is stable.  Physical therapy evaluation suggested a skilled nursing facility placement.  Code Status:     Code Status Orders        Start     Ordered   11/26/15 2241  Full code   Continuous     11/26/15 2240       All the records are reviewed and case discussed with Care Management/Social Workerr. Management plans discussed with the  patient, her daughter and they are in agreement.  TOTAL TIME TAKING CARE OF THIS PATIENT: 42 minutes.  Greater than 50% time was spent on coordination of care and face-to-face counseling.  POSSIBLE D/C IN 3 DAYS, DEPENDING ON CLINICAL CONDITION.  Demetrios Loll  [facility]

## 2015-11-30 NOTE — Progress Notes (Signed)
Subjective:  Came to see patient several times yesterday but patient wasn't liver biopsy. She tolerated biopsy well. Good urine output noted of 2.9 L over the past 24 hours. Serum potassium significant low at 2.6. Potassium repletion has been ordered.   Objective:  Vital signs in last 24 hours:  Temp:  [97.7 F (36.5 C)-98.7 F (37.1 C)] 97.9 F (36.6 C) (12/13 1117) Pulse Rate:  [76-99] 81 (12/13 1117) Resp:  [14-24] 18 (12/13 1117) BP: (138-194)/(74-96) 148/74 mmHg (12/13 1117) SpO2:  [94 %-100 %] 100 % (12/13 1117) Weight:  [118.026 kg (260 lb 3.2 oz)] 118.026 kg (260 lb 3.2 oz) (12/13 0528)  Weight change: -3.629 kg (-8 lb) Filed Weights   11/28/15 0458 11/29/15 0336 11/30/15 0528  Weight: 121.745 kg (268 lb 6.4 oz) 121.655 kg (268 lb 3.2 oz) 118.026 kg (260 lb 3.2 oz)    Intake/Output:    Intake/Output Summary (Last 24 hours) at 11/30/15 1151 Last data filed at 11/30/15 1035  Gross per 24 hour  Intake    880 ml  Output   2900 ml  Net  -2020 ml     Physical Exam: General:  obese lady, laying in the bed   HEENT  moist oral mucous membranes   Neck  supple no masses   Pulm/lungs  normal respiratory effort, scattered rhonchi otherwise clear   CVS/Heart  regular rate and rhythm, no rub or gallop   Abdomen:   soft, distended, nontender   Extremities:  2+ pitting edema, anasarca   Neurologic:  alert, oriented, speech normal   Skin:  no acute rashes           Basic Metabolic Panel:   Recent Labs Lab 11/26/15 1834 11/27/15 0546 11/27/15 1519 11/28/15 0715 11/29/15 0556 11/30/15 0637  NA 144 147*  --  141 142 143  K 2.6* 2.9* 3.8 4.6 4.0 2.6*  CL 99* 103  --  103 102 98*  CO2 32 36*  --  31 36* 38*  GLUCOSE 678* 386*  --  313* 372* 263*  BUN 24* 21*  --  19 21* 21*  CREATININE 1.19* 1.03*  --  0.97 0.98 0.92  CALCIUM 9.1 8.8*  --  8.8* 8.8* 8.8*  MG 2.1  --   --   --   --  2.0     CBC:  Recent Labs Lab 11/26/15 1834 11/27/15 0546  11/30/15 0637  WBC 6.9 6.6 6.5  NEUTROABS  --   --  5.7  HGB 9.6* 8.7* 8.8*  HCT 31.5* 28.0* 29.3*  MCV 75.6* 74.9* 75.1*  PLT 105* 97* 113*      Microbiology:  No results found for this or any previous visit (from the past 720 hour(s)).  Coagulation Studies:  Recent Labs  11/28/15 1154  LABPROT 13.9  INR 1.05    Urinalysis: No results for input(s): COLORURINE, LABSPEC, PHURINE, GLUCOSEU, HGBUR, BILIRUBINUR, KETONESUR, PROTEINUR, UROBILINOGEN, NITRITE, LEUKOCYTESUR in the last 72 hours.  Invalid input(s): APPERANCEUR    Imaging: Ct Biopsy  11/29/2015  CLINICAL DATA:  Hepatic mass EXAM: CT GUIDED CORE BIOPSY OF HEPATIC MASS ANESTHESIA/SEDATION: NONE PROCEDURE: The procedure risks, benefits, and alternatives were explained to the patient. Questions regarding the procedure were encouraged and answered. The patient understands and consents to the procedure. The right anterior abdominal wall was prepped with chlorhexidinein a sterile fashion, and a sterile drape was applied covering the operative field. A sterile gown and sterile gloves were used for the procedure. Local anesthesia  was provided with 1% Lidocaine. Utilizing CT fluoroscopic guidance a 17 gauge guiding needle was placed percutaneously into the anterior aspect of the right lobe of the liver. It was placed at the margin of the hypodense mass lesion posteriorly. Multiple 18 gauge core biopsies were then obtained. The guiding needle was then removed in Gel-Foam placed to aid in hemostasis. No post procedure hematoma is noted. The patient tolerated the procedure well and was returned to her room in satisfactory condition. COMPLICATIONS: None immediate IMPRESSION: Successful CT-guided liver biopsy as described. Electronically Signed   By: Inez Catalina M.D.   On: 11/29/2015 13:53     Medications:     . antiseptic oral rinse  7 mL Mouth Rinse BID  . aspirin  81 mg Oral Daily  . cloNIDine  0.3 mg Oral BID  . furosemide  40  mg Intravenous Q12H  . hydrALAZINE  50 mg Oral TID  . insulin aspart  0-15 Units Subcutaneous TID WC  . insulin aspart  0-5 Units Subcutaneous QHS  . insulin aspart  8 Units Subcutaneous TID WC  . insulin glargine  40 Units Subcutaneous QHS  . irbesartan  75 mg Oral Daily  . isosorbide mononitrate  60 mg Oral Daily  . labetalol  200 mg Oral BID  . potassium chloride  10 mEq Intravenous Q1 Hr x 4  . potassium chloride  20 mEq Oral BID  . sodium chloride  3 mL Intravenous Q12H  . spironolactone  25 mg Oral Daily   acetaminophen **OR** acetaminophen, hydrALAZINE, HYDROcodone-acetaminophen, ondansetron **OR** ondansetron (ZOFRAN) IV, oxyCODONE-acetaminophen, polyethylene glycol  Assessment/ Plan:  59 y.o. female with a PMHX of long-standing hypertension, hip arthritis, stroke, with residual left-sided weakness, was admitted on 11/26/2015 with worsening generalized edema, dyspnea on exertion, found to have hypokalemia upon admission. Ultrasound of the abdomen shows large mass in right hepatic lobe, concerning for hepatic carcinoma  1. Generalized edema  2. Hypokalemia 3. Hypernatremia 4. Glucosuria, hyperglycemia 5. Severe hypertension 6. Liver mass with metastases by ultrasound, CT of the abdomen  7. Anemia, unspecified 8. Thrombocytopenia  9. Proteinuria, non nephrotic urine pro/cr 1.3 10. New diagnosis of DM-2 (HbA1c 9.5%)  Patient presents as an interesting case. She has been having worsening of generalized edema for the last few months now. She has incidental finding of right hepatic mass concerning for hepatic carcinoma. She also has incidental findings of glucosuria, hematuria and hyperglycemia. She has thrombocytopenia and anemia which go along liver disease. She has severe hypokalemia. Potassium is being replaced aggressively at present.  Plan: Patient reports that her edema has improved. She is quite sensitive to Lasix in that she developed hypokalemia rapidly. Agree with adding  spironolactone as a potassium sparing diuretic. Patient has been given some oral potassium repletion but she will likely need IV repletion given aggressive IV Lasix usage. Therefore we will add potassium chloride 40 mEq intravenously today as well. She has liver mass and has undergone liver biopsy yesterday. Awaiting these results. Appreciate endocrinology consultation as well.    LOS: 4 Renee Wells 12/13/201611:51 AM

## 2015-11-30 NOTE — Progress Notes (Signed)
Talked with patient about diabetes and reviewed some of the information we discussed yesterday. Patient is able recall some of the information. Patient has not had an opportunity to review the Living Well with Diabetes booklet nor the insulin starter kit yet. Encouraged patient to read through each to gain more knowledge about diabetes and what she will need to do to keep glucose controlled. Discussed insulin injections with vial/syringe and pen/pen needles. Patient states that she would prefer to use insulin pen(s). Educated patient on insulin pen use at home. Reviewed contents of insulin flexpen starter kit. Reviewed all steps of insulin pen including attachment of needle, 2-unit air shot, dialing up dose, giving injection, removing needle, disposal of sharps, storage of unused insulin, disposal of insulin etc. Patient able to provide successful return demonstration. Since patient lives with her son and his girlfriend, they will also need to be educated on an insulin pen. Asked patient to let her nurse know when her son and his girlfriend are here so that they can also be educated. Encouraged patient to also have the pharmacist at her outpatient drug store also review how to use the insulin pen when the prescription is picked up from her pharmacy. Patient verbalized understanding of information discussed and she states that she has no further questions at this time related to diabetes.    At time of discharge, patient will need Rxs for glucometer, testing supplies,  insulin pen(s) and insulin pen needles.  Thanks, Barnie Alderman, RN, MSN, CDE Diabetes Coordinator Inpatient Diabetes Program [phone] (Team Pager from [place] to [place]) [phone] ([place] office) [phone] [facility] office) [phone] [facility] office)

## 2015-11-30 NOTE — Progress Notes (Signed)
Dr. Bridgett Larsson made aware of 6 beat V.tach on tele/ pt asymptomatic/ will continue to monitor.

## 2015-11-30 NOTE — Progress Notes (Signed)
ENDOCRINOLOGY FOLLOW-UP  REFERRING PHYSICIAN: Demetrios Loll, MD CONSULTING PHYSICIAN:  A. Lavone Orn, MD.  CHIEF COMPLAINT:  Diabetes  HISTORY OF PRESENT ILLNESS:  59 y.o. female seen in follow up for for a new diagnosis of diabetes mellitus, with Hgb A1c 9.5%.  Now receiving Lantus insulin 40 units qhs, NovoLog 8 units tid AC + NovoLog SSI qACHS. Sugars remain uncontrolled and in the 217-278 range today. She has a good appetite. Eating 100% of meal trays. No N/V. No acute complaints. No weakness. No bowel complaints.    PAST MEDICAL HISTORY:  Past Medical History  Diagnosis Date  . Stroke [facility])     a. 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  . Malignant hypertension     a. Admitted 06/2015 and 11/2015;  b. 06/2015 nl aldosterone/renin activity;  c. Polypharmacy.  [name] HLD (hyperlipidemia)   . Morbid obesity ([place])   . Anasarca     a. 11/2015 Echo: EF 55-65%, triv TR/MR.  [name] Hypokalemia   . Type II diabetes mellitus ([place])     a. 11/2015 A1c 9.5.  . Metastatic cancer ([place])     a. 11/2015 CT abd/pelvis: large R hepatic lobe mass extending inf to pararenal space on R. Complete occlusion of R portal vein. L hepatic lobe lesion. Bilat LL pulm mets. Skeletal mets 2/ Fx of R iliac wing. Enhancing uterine fundal mass.     CURRENT MEDICATIONS:  . antiseptic oral rinse  7 mL Mouth Rinse BID  . [START ON 11/30/2015] aspirin  81 mg Oral Daily  . cloNIDine  0.3 mg Oral BID  . furosemide  40 mg Intravenous Q12H  . hydrALAZINE  50 mg Oral TID  . HYDROcodone-acetaminophen      . insulin aspart  0-15 Units Subcutaneous TID WC  . insulin aspart  0-5 Units Subcutaneous QHS   Insulin aspart 8 units Subcutaneous TID qAC  . insulin glargine  40 Units Subcutaneous QHS  . irbesartan  75 mg Oral Daily  . isosorbide mononitrate  60 mg Oral Daily  . labetalol  200 mg Oral BID  . potassium chloride  20 mEq Oral BID  . sodium chloride  3 mL  Intravenous Q12H     SOCIAL HISTORY:  Social History  Substance Use Topics  . Smoking status: Never Smoker   . Smokeless tobacco: None  . Alcohol Use: No     Comment: occasional    FAMILY HISTORY:   Family History  Problem Relation Age of Onset  . Heart disease Mother   . Hypertension Mother   . Diabetes Mother   . Diabetes Sister   . Hyperlipidemia Sister     ALLERGIES:  Allergies  Allergen Reactions  . Penicillins Itching    PHYSICAL EXAMINATION:  BP 148/74 mmHg  Pulse 81  Temp(Src) 97.9 F (36.6 C) (Oral)  Resp 18  Ht '5\' 5"'$  (1.651 m)  Wt 118.026 kg (260 lb 3.2 oz)  BMI 43.30 kg/m2  SpO2 100%  GENERAL: Obese AAF in NAD. HEENT:  EOMI.  Oropharynx is clear.  NECK:  Supple.  No thyromegaly.  No neck tenderness.  CARDIAC:  Regular rate and rhythm without murmur.  PULMONARY:  Clear to auscultation bilaterally. No wheeze. ABDOMEN:  Soft, NT. NABS.  EXTREMITIES: 2+ peripheral edema is present.    SKIN:  No rash or dermatopathy. NEUROLOGIC:  No dysarthria.  No tremor. PSYCHIATRIC:  Alert and oriented, calm, cooperative.   LABORATORY DATA:  Results for orders placed or performed during the  hospital encounter of 11/26/15 (from the past 24 hour(s))  Glucose, capillary     Status: Abnormal   Collection Time: 11/29/15  9:17 PM  Result Value Ref Range   Glucose-Capillary 299 (H) 65 - 99 mg/dL   Comment 1 Notify RN   CBC with Differential/Platelet     Status: Abnormal   Collection Time: 11/30/15  6:37 AM  Result Value Ref Range   WBC 6.5 3.6 - 11.0 K/uL   RBC 3.90 3.80 - 5.20 MIL/uL   Hemoglobin 8.8 (L) 12.0 - 16.0 g/dL   HCT 29.3 (L) 35.0 - 47.0 %   MCV 75.1 (L) 80.0 - 100.0 fL   MCH 22.5 (L) 26.0 - 34.0 pg   MCHC 29.9 (L) 32.0 - 36.0 g/dL   RDW 18.8 (H) 11.5 - 14.5 %   Platelets 113 (L) 150 - 440 K/uL   Neutrophils Relative % 89 %   Neutro Abs 5.7 1.4 - 6.5 K/uL   Lymphocytes Relative 4 %   Lymphs Abs 0.3 (L) 1.0 - 3.6 K/uL   Monocytes Relative 7 %    Monocytes Absolute 0.4 0.2 - 0.9 K/uL   Eosinophils Relative 0 %   Eosinophils Absolute 0.0 0 - 0.7 K/uL   Basophils Relative 0 %   Basophils Absolute 0.0 0 - 0.1 K/uL  Comprehensive metabolic panel     Status: Abnormal   Collection Time: 11/30/15  6:37 AM  Result Value Ref Range   Sodium 143 135 - 145 mmol/L   Potassium 2.6 (LL) 3.5 - 5.1 mmol/L   Chloride 98 (L) 101 - 111 mmol/L   CO2 38 (H) 22 - 32 mmol/L   Glucose, Bld 263 (H) 65 - 99 mg/dL   BUN 21 (H) 6 - 20 mg/dL   Creatinine, Ser 0.92 0.44 - 1.00 mg/dL   Calcium 8.8 (L) 8.9 - 10.3 mg/dL   Total Protein 5.9 (L) 6.5 - 8.1 g/dL   Albumin 3.1 (L) 3.5 - 5.0 g/dL   AST 33 15 - 41 U/L   ALT 49 14 - 54 U/L   Alkaline Phosphatase 237 (H) 38 - 126 U/L   Total Bilirubin 0.8 0.3 - 1.2 mg/dL   GFR calc non Af Amer >60 >60 mL/min   GFR calc Af Amer >60 >60 mL/min   Anion gap 7 5 - 15  Magnesium     Status: None   Collection Time: 11/30/15  6:37 AM  Result Value Ref Range   Magnesium 2.0 1.7 - 2.4 mg/dL  Glucose, capillary     Status: Abnormal   Collection Time: 11/30/15  7:56 AM  Result Value Ref Range   Glucose-Capillary 226 (H) 65 - 99 mg/dL   Comment 1 Notify RN   Glucose, capillary     Status: Abnormal   Collection Time: 11/30/15 11:46 AM  Result Value Ref Range   Glucose-Capillary 278 (H) 65 - 99 mg/dL   Comment 1 Notify RN   Glucose, capillary     Status: Abnormal   Collection Time: 11/30/15  4:56 PM  Result Value Ref Range   Glucose-Capillary 217 (H) 65 - 99 mg/dL   Comment 1 Notify RN     ASSESSMENT:  1. Diabetes mellitus - new diagnosis 2.   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  PLAN: 1. Increase mealtime NovoLog to 16 units tid AC. Continue same SSI qACHS. 2. Continue Lantus 40 units qhs.  3. Appreciate assistance of diabetes coordinators.  4. Continue modified carb diet.  At discharge, she will need the following prescribed LANTUS SOLOSTAR PEN (currently 40 units qHS) #15 ml  NOVOLOG  FLEX PEN (currently 16 units tid AC) #15 ml INSULIN PEN NEEDLES FOR USE 4 TIMES DAILY #100 ONE TOUCH ULTRA GLUCOMETER ONE TOUCH ULTRA TEST STRIPS FOR USE 4 TIMES DAILY DELICA LANCETS FOR USE 4 TIMES DAILY  Will follow along with you and arrange out-patient follow up when she is ready for hospital discharge.

## 2015-11-30 NOTE — Care Management (Signed)
have made 2 attempts to assess patient today and she asks if CM can reschedule for 12/14.  Says she is very tired.    Patient had liver biopsy yesterday.   Discussed the need for room air pulse assessment and the possibility of palliative care consult.  Attending will await further input from oncology before making this decision.  Physical therapy has recommended skilled nursing facility placement.

## 2015-11-30 NOTE — Progress Notes (Signed)
PT Cancellation Note  Patient Details Name: Renee Wells MRN: [id number] DOB: 22-Aug-1956   Cancelled Treatment:    Reason Eval/Treat Not Completed: Medical issues which prohibited therapy. Pt with severe hypokalemia this date decreasing from 4.0->2.6 this date. Pt is not appropriate for physical therapy at this time. Will re-attempt, when medically stable.   Alleyne Lac 11/30/2015, 10:28 AM  Greggory Stallion, PT, DPT [phone]

## 2015-12-01 DIAGNOSIS — I5023 Acute on chronic systolic (congestive) heart failure: Secondary | ICD-10-CM

## 2015-12-01 LAB — GLUCOSE, CAPILLARY
GLUCOSE-CAPILLARY: 173 mg/dL — AB (ref 65–99)
GLUCOSE-CAPILLARY: 93 mg/dL (ref 65–99)
GLUCOSE-CAPILLARY: 166 mg/dL — AB (ref 65–99)
Glucose-Capillary: 228 mg/dL — ABNORMAL HIGH (ref 65–99)

## 2015-12-01 LAB — BASIC METABOLIC PANEL
ANION GAP: 10 (ref 5–15)
BUN: 25 mg/dL — ABNORMAL HIGH (ref 6–20)
CALCIUM: 9.2 mg/dL (ref 8.9–10.3)
CO2: 38 mmol/L — AB (ref 22–32)
Chloride: 96 mmol/L — ABNORMAL LOW (ref 101–111)
Creatinine, Ser: 0.94 mg/dL (ref 0.44–1.00)
Glucose, Bld: 246 mg/dL — ABNORMAL HIGH (ref 65–99)
POTASSIUM: 3.5 mmol/L (ref 3.5–5.1)
Sodium: 144 mmol/L (ref 135–145)

## 2015-12-01 LAB — AFP TUMOR MARKER: AFP TUMOR MARKER: 5.4 ng/mL (ref 0.0–8.3)

## 2015-12-01 LAB — CEA: CEA: 8.3 ng/mL — AB (ref 0.0–4.7)

## 2015-12-01 LAB — ALDOSTERONE + RENIN ACTIVITY W/ RATIO
ALDO / PRA Ratio: 6.8 (ref 0.0–30.0)
ALDOSTERONE: 43.2 ng/dL — AB (ref 0.0–30.0)
PRA LC/MS/MS: 6.38 ng/mL/hr

## 2015-12-01 LAB — PTT FACTOR INHIBITOR (MIXING STUDY)
APTT 1 1 NORMAL PLASMA: 23.8 s (ref 22.9–30.2)
APTT 11 MIX SALINE: 32.8 s
APTT: 22.5 s — AB (ref 22.9–30.2)

## 2015-12-01 MED ORDER — POTASSIUM CHLORIDE CRYS ER 20 MEQ PO TBCR
30.0000 meq | EXTENDED_RELEASE_TABLET | Freq: Two times a day (BID) | ORAL | Status: DC
  Administered 2015-12-01: 30 meq via ORAL
  Filled 2015-12-01: qty 1

## 2015-12-01 MED ORDER — LABETALOL HCL 200 MG PO TABS
300.0000 mg | ORAL_TABLET | Freq: Two times a day (BID) | ORAL | Status: DC
  Administered 2015-12-01 – 2015-12-03 (×4): 300 mg via ORAL
  Filled 2015-12-01 (×5): qty 2

## 2015-12-01 MED ORDER — LABETALOL HCL 200 MG PO TABS
100.0000 mg | ORAL_TABLET | Freq: Once | ORAL | Status: AC
  Administered 2015-12-01: 100 mg via ORAL
  Filled 2015-12-01: qty 1

## 2015-12-01 MED ORDER — INSULIN ASPART 100 UNIT/ML ~~LOC~~ SOLN
16.0000 [IU] | Freq: Three times a day (TID) | SUBCUTANEOUS | Status: DC
  Administered 2015-12-02 – 2015-12-03 (×5): 16 [IU] via SUBCUTANEOUS
  Filled 2015-12-01 (×5): qty 16

## 2015-12-01 NOTE — Care Management (Signed)
Patient says she has always been in good health, has exercised and ate a healthy diet.  She does have history of CVA and hypertension.  She lives with her son and daughter in [place].  This daughter in law- Jackalyn Lombard is present.  Prior to this admission patient independent in all her iadls and adls.  She sustained an exacerbation of a chronic hip injury and was seen in the ED about one month ago.  Patient said 'they asked me about all this fluid then."  Patient says she spoke to her PCP Dr Salvadore Farber on several occassions about her fluid retention and the physician seemed to think it was all due to diet.  Patient does decline the recommendation of skilled nursing placement.  She is agreeable to home health and provided patient with list of agencies for agency preference.  Discussed that medicaid does not reimburse for home health physical therapy under current diagnosis.  Discussed home health nursing, aide and social work.  Patient may now qualify for medicaid pcs services.  Patient qualified for home 02 this morning 11:20A with room air resting sat of 87%

## 2015-12-01 NOTE — Progress Notes (Signed)
Patient ID: Renee Wells, female   DOB: 01-19-1956, 59 y.o.   MRN: [id number] [facility] PROGRESS NOTE  PCP: Sharyne Peach, MD  HPI/Subjective: Abdominal cramps, better leg swelling and shortness of breath, on oxygen 2 L by nasal cannula. Objective: Filed Vitals:   12/01/15 1115 12/01/15 1141  BP:  125/60  Pulse: 90 84  Temp:  98.7 F (37.1 C)  Resp:  16    Filed Weights   11/30/15 0528 12/01/15 0532 12/01/15 0640  Weight: 118.026 kg (260 lb 3.2 oz) 120.475 kg (265 lb 9.6 oz) 117.028 kg (258 lb)    ROS: ROS Exam: Physical Exam  Constitutional: She is oriented to person, place, and time.  HENT:  Nose: No mucosal edema.  Mouth/Throat: No oropharyngeal exudate or posterior oropharyngeal edema.  Eyes: Conjunctivae, EOM and lids are normal. Pupils are equal, round, and reactive to light.  Neck: No JVD present. Carotid bruit is not present. No edema present. No thyroid mass and no thyromegaly present.  Cardiovascular: S1 normal and S2 normal.  Exam reveals no gallop.   No murmur heard. Pulses:      Dorsalis pedis pulses are 2+ on the right side, and 2+ on the left side.  Respiratory: No respiratory distress. She has no decreased breath sounds. She has no wheezes. She has no rhonchi. She has no rales.  GI: Soft. Bowel sounds are normal. There is no tenderness.  Musculoskeletal:       Right ankle: She exhibits swelling.       Left ankle: She exhibits swelling.  Lymphadenopathy:    She has no cervical adenopathy.  Neurological: She is alert and oriented to person, place, and time. No cranial nerve deficit.  Skin: Skin is warm. No rash noted. Nails show no clubbing.  Psychiatric: She has a normal mood and affect.    Data Reviewed: Basic Metabolic Panel:  Recent Labs Lab 11/26/15 1834 11/27/15 0546  11/28/15 0715 11/29/15 0556 11/30/15 0637 11/30/15 1940 12/01/15 0553  NA 144 147*  --  141 142 143  --  144  K 2.6* 2.9*  < > 4.6 4.0 2.6* 3.6 3.5   CL 99* 103  --  103 102 98*  --  96*  CO2 32 36*  --  31 36* 38*  --  38*  GLUCOSE 678* 386*  --  313* 372* 263*  --  246*  BUN 24* 21*  --  19 21* 21*  --  25*  CREATININE 1.19* 1.03*  --  0.97 0.98 0.92  --  0.94  CALCIUM 9.1 8.8*  --  8.8* 8.8* 8.8*  --  9.2  MG 2.1  --   --   --   --  2.0  --   --   < > = values in this interval not displayed. Liver Function Tests:  Recent Labs Lab 11/26/15 1834 11/30/15 0637  AST 48* 33  ALT 72* 49  ALKPHOS 238* 237*  BILITOT 1.3* 0.8  PROT 6.1* 5.9*  ALBUMIN 3.2* 3.1*   CBC:  Recent Labs Lab 11/26/15 1834 11/27/15 0546 11/30/15 0637  WBC 6.9 6.6 6.5  NEUTROABS  --   --  5.7  HGB 9.6* 8.7* 8.8*  HCT 31.5* 28.0* 29.3*  MCV 75.6* 74.9* 75.1*  PLT 105* 97* 113*    CBG:  Recent Labs Lab 11/30/15 1146 11/30/15 1656 11/30/15 2111 12/01/15 0740 12/01/15 1141  GLUCAP 278* 217* 388* 228* 173*    Studies: No results  found.  Scheduled Meds: . antiseptic oral rinse  7 mL Mouth Rinse BID  . aspirin  81 mg Oral Daily  . cloNIDine  0.3 mg Oral BID  . enoxaparin (LOVENOX) injection  40 mg Subcutaneous Q12H  . furosemide  40 mg Intravenous Q12H  . hydrALAZINE  50 mg Oral TID  . insulin aspart  0-15 Units Subcutaneous TID WC  . insulin aspart  0-5 Units Subcutaneous QHS  . insulin aspart  16 Units Subcutaneous TID WC  . insulin glargine  40 Units Subcutaneous QHS  . irbesartan  75 mg Oral Daily  . isosorbide mononitrate  60 mg Oral Daily  . labetalol  300 mg Oral BID  . sodium chloride  3 mL Intravenous Q12H  . spironolactone  25 mg Oral Daily    Assessment/Plan:  1. Metastatic cancer, as per oncology likely liver primary. Large masses on liver, pulmonary nodules, lesion on bone and also uterus mass.   alpha-fetoprotein 5.4 and CEA 8.3. s/p ultrasound guided biopsy of the liver. Follow-up pathology and the oncologist.  2. Severe hypokalemia- given potassium supplement, improved. Magnesium level is normal. Continue  potassium supplement and follow-up BMP. 3. Anasarca- Due to acute diastolic CHFper cardiology consult, continue IV Lasix 40 mg bid and follow-up BMP. 4. Accelerated hypertension, better controlled, continue hypertension medications, resumed home spironolactone. Increased labetalol to 300 mg twice a day. 5. Diabetes mellitus-  hemoglobin A1c is 9.5, Per Dr. Gabriel Carina,  Lantus 40 units subcutaneous at bedtime, increased novolog to 16 units AC and continue sliding scale.  6. Morbid obesity 7. History of stroke- resumed aspirin after liver biopsy.  8. Thrombocytopenia. Stable. 9. Anemia of chronic disease. Hemoglobin is stable.  Physical therapy evaluation suggested skilled nursing facility placement.  Code Status:     Code Status Orders        Start     Ordered   11/26/15 2241  Full code   Continuous     11/26/15 2240       All the records are reviewed and case discussed with Care Management/Social Workerr. Management plans discussed with the patient, her daughter and they are in agreement.  TOTAL TIME TAKING CARE OF THIS PATIENT: 35 minutes.  Greater than 50% time was spent on coordination of care and face-to-face counseling.  POSSIBLE D/C IN 2 DAYS, DEPENDING ON CLINICAL CONDITION.  Demetrios Loll  [facility]

## 2015-12-01 NOTE — Progress Notes (Addendum)
SATURATION QUALIFICATIONS: (This note is used to comply with regulatory documentation for home oxygen)  Patient Saturations on Room Air at Rest = 87%  Patient Saturations on Room Air while Ambulating = 00%  Patient Saturations on 0 Liters of oxygen while Ambulating = 00%  Please briefly explain why patient needs home oxygen:  Pt has new dx lung CA, also has old left side CVA, left side is weak and pt is deconditioned, cannot walk at this time, very heavy assist to stand and pivot to chair.  O2 sats 93% on 2 liters [place] at rest.

## 2015-12-01 NOTE — Progress Notes (Signed)
Physical Therapy Treatment Patient Details Name: Renee Wells MRN: [id number] DOB: [birth year] Today's Date: 12/01/2015    History of Present Illness 58 yo Female was brought to ED with increased swelling in BLE, increased shortness of breath and general decline of physical functioning. Patient has a PMH significant for CVA with left sided weakness approximately 3 years ago. She aws admitted with Hypokalemia; While in hospital patient was found to be hyperglycemic with Hemaglobin A1C 9.5. She also received an [place] of abdomen and found a hepatic mass. patient is being worked up for possible lung/liver CA. She is supposed to get biopsy on Monday 11/29/15;     PT Comments    Pt agreeable to PT. Continues to demonstrate need for Min A for bed mobility and transfers. Progressing distance for ambulation (22 ft). Ambulation is effortful and mildly unsteady. Attempted seated exercises at edge of bed on room air; O2 saturation decreased to 87%, so pt placed back on 2 liters O2 for the remaining of session/ambulation. Pt received up in chair comfortably. Continue PT to progress strength, transfers, and ambulation to improve functional mobility.   Follow Up Recommendations  SNF     Equipment Recommendations       Recommendations for Other Services       Precautions / Restrictions Restrictions Weight Bearing Restrictions: No    Mobility  Bed Mobility Overal bed mobility: Needs Assistance Bed Mobility: Supine to Sit     Supine to sit: Min assist;HOB elevated     General bed mobility comments: Also requires Min A for scooting to edge of bed on L  Transfers Overall transfer level: Needs assistance Equipment used: Rolling Haug[place] (2 wheeled) Transfers: Sit to/from Stand Sit to Stand: Min assist         General transfer comment: cues for safe hand placement and placement of L foot. Improved control to sit in chair with cues   Ambulation/Gait Ambulation/Gait assistance: Min  guard Ambulation Distance (Feet): 22 Feet Assistive device: Rolling [place] (2 wheeled) Gait Pattern/deviations: Step-through pattern;Decreased step length - left;Decreased dorsiflexion - left;Trunk flexed;Wide base of support (poor control LLE expecially knee; pain in R ankle) Gait velocity: decreased Gait velocity interpretation: <1.8 ft/sec, indicative of risk for recurrent falls General Gait Details: Ambulation mildly unsteady with multiple deficits in BLEs including weakness throughout LLE especially knee with stance phase and foot drop. R ankle pain/deformity due to previous injury with pes planus and valgus knee. Slow effortful steps   Stairs            Wheelchair Mobility    Modified Rankin (Stroke Patients Only)       Balance           Standing balance support: Bilateral upper extremity supported Standing balance-Leahy Scale: Fair                      Cognition Arousal/Alertness: Awake/alert Behavior During Therapy: WFL for tasks assessed/performed Overall Cognitive Status: Within Functional Limits for tasks assessed                      Exercises General Exercises - Lower Extremity Ankle Circles/Pumps: AROM;Right;20 reps;Seated (some limited motion L toes) Long Arc Quad: AROM;Both;20 reps;Seated (less range on left) Hip ABduction/ADduction: AROM;Both;20 reps;Seated Hip Flexion/Marching: AROM;Both;20 reps;Seated (limited range on left)    General Comments        Pertinent Vitals/Pain Pain Assessment: No/denies pain    Home Living  Prior Function            PT Goals (current goals can now be found in the care plan section) Progress towards PT goals: Progressing toward goals    Frequency  Min 2X/week    PT Plan Current plan remains appropriate    Co-evaluation             End of Session Equipment Utilized During Treatment: Gait belt;Oxygen Activity Tolerance: Patient limited by  fatigue;Patient tolerated treatment well Patient left: in chair;with call bell/phone within reach;with chair alarm set     Time: 1115-1138 PT Time Calculation (min) (ACUTE ONLY): 23 min  Charges:  $Gait Training: 8-22 mins $Therapeutic Exercise: 8-22 mins                    G Codes:      Renee Wells 12/01/2015, 11:49 AM

## 2015-12-01 NOTE — Progress Notes (Signed)
ENDOCRINOLOGY FOLLOW-UP  REFERRING PHYSICIAN: Demetrios Loll, MD CONSULTING PHYSICIAN:  A. Lavone Orn, MD.  CHIEF COMPLAINT:  Diabetes  HISTORY OF PRESENT ILLNESS:  58 y.o. female seen in follow up for for a new diagnosis of diabetes mellitus, with Hgb A1c 9.5%.    Now receiving Lantus insulin 40 units qhs, NovoLog 16 units tid AC + NovoLog SSI qACHS. Sugars have been in the in the 93-228 range today. Appears that morning NovoLog given after breakfast, rather than prior. She has a good appetite. Eating 100% of meal trays. No N/V. No acute complaints. No weakness. No bowel complaints.    PAST MEDICAL HISTORY:  Past Medical History  Diagnosis Date  . Stroke [facility])     a. 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  . Malignant hypertension     a. Admitted 06/2015 and 11/2015;  b. 06/2015 nl aldosterone/renin activity;  c. Polypharmacy.  [name] HLD (hyperlipidemia)   . Morbid obesity ([place])   . Anasarca     a. 11/2015 Echo: EF 55-65%, triv TR/MR.  [name] Hypokalemia   . Type II diabetes mellitus ([place])     a. 11/2015 A1c 9.5.  . Metastatic cancer ([place])     a. 11/2015 CT abd/pelvis: large R hepatic lobe mass extending inf to pararenal space on R. Complete occlusion of R portal vein. L hepatic lobe lesion. Bilat LL pulm mets. Skeletal mets 2/ Fx of R iliac wing. Enhancing uterine fundal mass.     CURRENT MEDICATIONS:   Current facility-administered medications:  .  acetaminophen (TYLENOL) tablet 650 mg, 650 mg, Oral, Q6H PRN **OR** acetaminophen (TYLENOL) suppository 650 mg, 650 mg, Rectal, Q6H PRN, Aldean Jewett, MD .  antiseptic oral rinse (CPC / CETYLPYRIDINIUM CHLORIDE 0.05%) solution 7 mL, 7 mL, Mouth Rinse, BID, Aldean Jewett, MD, 7 mL at 12/01/15 1000 .  aspirin chewable tablet 81 mg, 81 mg, Oral, Daily, Demetrios Loll, MD, 81 mg at 12/01/15 0914 .  cloNIDine (CATAPRES) tablet 0.3 mg, 0.3 mg, Oral, BID, Aldean Jewett, MD, 0.3 mg  at 12/01/15 0914 .  enoxaparin (LOVENOX) injection 40 mg, 40 mg, Subcutaneous, Q12H, Demetrios Loll, MD, 40 mg at 12/01/15 8921 .  furosemide (LASIX) injection 40 mg, 40 mg, Intravenous, Q12H, Demetrios Loll, MD, 40 mg at 12/01/15 1620 .  hydrALAZINE (APRESOLINE) injection 10 mg, 10 mg, Intravenous, Q6H PRN, Demetrios Loll, MD .  hydrALAZINE (APRESOLINE) tablet 50 mg, 50 mg, Oral, TID, Murlean Iba, MD, 50 mg at 12/01/15 1620 .  HYDROcodone-acetaminophen (NORCO/VICODIN) 5-325 MG per tablet 1-2 tablet, 1-2 tablet, Oral, Q4H PRN, Inez Catalina, MD, 1 tablet at 11/30/15 0902 .  insulin aspart (novoLOG) injection 0-15 Units, 0-15 Units, Subcutaneous, TID WC, Judi Cong, MD, 3 Units at 12/01/15 1211 .  insulin aspart (novoLOG) injection 0-5 Units, 0-5 Units, Subcutaneous, QHS, Demetrios Loll, MD, 5 Units at 11/30/15 2155 .  [START ON 12/02/2015] insulin aspart (novoLOG) injection 16 Units, 16 Units, Subcutaneous, TID WC, Judi Cong, MD .  insulin glargine (LANTUS) injection 40 Units, 40 Units, Subcutaneous, QHS, Judi Cong, MD, 40 Units at 11/30/15 2155 .  irbesartan (AVAPRO) tablet 75 mg, 75 mg, Oral, Daily, Murlean Iba, MD, 75 mg at 12/01/15 0914 .  isosorbide mononitrate (IMDUR) 24 hr tablet 60 mg, 60 mg, Oral, Daily, Aldean Jewett, MD, 60 mg at 12/01/15 0913 .  labetalol (NORMODYNE) tablet 300 mg, 300 mg, Oral, BID, Rogelia Mire, NP .  ondansetron [facility]) tablet 4 mg, 4 mg,  Oral, Q6H PRN **OR** ondansetron (ZOFRAN) injection 4 mg, 4 mg, Intravenous, Q6H PRN, Aldean Jewett, MD .  oxyCODONE-acetaminophen (PERCOCET/ROXICET) 5-325 MG per tablet 1 tablet, 1 tablet, Oral, Q4H PRN, Aldean Jewett, MD .  polyethylene glycol (MIRALAX / GLYCOLAX) packet 17 g, 17 g, Oral, Daily PRN, Aldean Jewett, MD .  potassium chloride (K-DUR,KLOR-CON) CR tablet 30 mEq, 30 mEq, Oral, BID, Munsoor Lateef, MD .  sodium chloride 0.9 % injection 3 mL, 3 mL, Intravenous, Q12H, Aldean Jewett, MD, 3 mL at 12/01/15  0913 .  spironolactone (ALDACTONE) tablet 25 mg, 25 mg, Oral, Daily, Demetrios Loll, MD, 25 mg at 12/01/15 7893    SOCIAL HISTORY:  Social History  Substance Use Topics  . Smoking status: Never Smoker   . Smokeless tobacco: None  . Alcohol Use: No     Comment: occasional    FAMILY HISTORY:   Family History  Problem Relation Age of Onset  . Heart disease Mother   . Hypertension Mother   . Diabetes Mother   . Diabetes Sister   . Hyperlipidemia Sister     ALLERGIES:  Allergies  Allergen Reactions  . Penicillins Itching    PHYSICAL EXAMINATION:  BP 125/60 mmHg  Pulse 84  Temp(Src) 98.7 F (37.1 C) (Oral)  Resp 16  Ht '5\' 5"'$  (1.651 m)  Wt 117.028 kg (258 lb)  BMI 42.93 kg/m2  SpO2 99%  GENERAL: Obese AAF in NAD. HEENT:  EOMI.  Oropharynx is clear.  NECK:  Supple.  No thyromegaly.  No neck tenderness.  CARDIAC:  Regular rate and rhythm without murmur.  PULMONARY:  Clear to auscultation bilaterally. No wheeze. ABDOMEN:  Soft, NT. NABS.  EXTREMITIES: 1+ peripheral edema is present.    SKIN:  No rash or dermatopathy. NEUROLOGIC:  No dysarthria.  No tremor. PSYCHIATRIC:  Alert and oriented, calm, cooperative.   LABORATORY DATA:  Results for orders placed or performed during the hospital encounter of 11/26/15 (from the past 24 hour(s))  Potassium     Status: None   Collection Time: 11/30/15  7:40 PM  Result Value Ref Range   Potassium 3.6 3.5 - 5.1 mmol/L  Glucose, capillary     Status: Abnormal   Collection Time: 11/30/15  9:11 PM  Result Value Ref Range   Glucose-Capillary 388 (H) 65 - 99 mg/dL  Basic metabolic panel     Status: Abnormal   Collection Time: 12/01/15  5:53 AM  Result Value Ref Range   Sodium 144 135 - 145 mmol/L   Potassium 3.5 3.5 - 5.1 mmol/L   Chloride 96 (L) 101 - 111 mmol/L   CO2 38 (H) 22 - 32 mmol/L   Glucose, Bld 246 (H) 65 - 99 mg/dL   BUN 25 (H) 6 - 20 mg/dL   Creatinine, Ser 0.94 0.44 - 1.00 mg/dL   Calcium 9.2 8.9 - 10.3 mg/dL   GFR  calc non Af Amer >60 >60 mL/min   GFR calc Af Amer >60 >60 mL/min   Anion gap 10 5 - 15  Glucose, capillary     Status: Abnormal   Collection Time: 12/01/15  7:40 AM  Result Value Ref Range   Glucose-Capillary 228 (H) 65 - 99 mg/dL  Glucose, capillary     Status: Abnormal   Collection Time: 12/01/15 11:41 AM  Result Value Ref Range   Glucose-Capillary 173 (H) 65 - 99 mg/dL  Glucose, capillary     Status: None   Collection Time: 12/01/15  4:19 PM  Result Value Ref Range   Glucose-Capillary 93 65 - 99 mg/dL    ASSESSMENT:  1. Diabetes mellitus - new diagnosis 2.   Liver masses and uterine mass, concerning for carcinoma. 3.   HTN  PLAN: 1. Glycemic control improved. No changes made today in insulin regimen. 2. Continue to monitor sugars qACHS. 3. NovoLog insulin to be given before meals only, not after.  4. Appreciate assistance of diabetes coordinators.  5. Continue modified carb diet.   At discharge, she will need the following prescribed LANTUS SOLOSTAR PEN (currently 40 units qHS) #15 ml  NOVOLOG FLEX PEN (currently 16 units tid AC) #15 ml INSULIN PEN NEEDLES FOR USE 4 TIMES DAILY #100 ONE TOUCH ULTRA GLUCOMETER ONE TOUCH ULTRA TEST STRIPS FOR USE 4 TIMES DAILY DELICA LANCETS FOR USE 4 TIMES DAILY  Will follow along with you and arrange out-patient follow up when she is ready for hospital discharge.

## 2015-12-01 NOTE — Progress Notes (Signed)
Subjective:  atient states that her lower extremity edema continues to improve. She had good urine output of 3.3 L yesterday. Potassium has also improved.   Objective:  Vital signs in last 24 hours:  Temp:  [98 F (36.7 C)-98.7 F (37.1 C)] 98.7 F (37.1 C) (12/14 1141) Pulse Rate:  [77-95] 84 (12/14 1141) Resp:  [16-19] 16 (12/14 1141) BP: (125-150)/(60-68) 125/60 mmHg (12/14 1141) SpO2:  [87 %-99 %] 99 % (12/14 1141) Weight:  [117.028 kg (258 lb)-120.475 kg (265 lb 9.6 oz)] 117.028 kg (258 lb) (12/14 0640)  Weight change: 2.449 kg (5 lb 6.4 oz) Filed Weights   11/30/15 0528 12/01/15 0532 12/01/15 0640  Weight: 118.026 kg (260 lb 3.2 oz) 120.475 kg (265 lb 9.6 oz) 117.028 kg (258 lb)    Intake/Output:    Intake/Output Summary (Last 24 hours) at 12/01/15 1714 Last data filed at 12/01/15 1041  Gross per 24 hour  Intake      0 ml  Output   4700 ml  Net  -4700 ml     Physical Exam: General:  obese lady, laying in the bed   HEENT  moist oral mucous membranes   Neck  supple no masses   Pulm/lungs  normal respiratory effort, scattered rhonchi otherwise clear   CVS/Heart  regular rate and rhythm, no rub or gallop   Abdomen:   soft, distended, nontender   Extremities:  2+ pitting edema, anasarca   Neurologic:  alert, oriented, speech normal   Skin:  no acute rashes           Basic Metabolic Panel:   Recent Labs Lab 11/26/15 1834 11/27/15 0546  11/28/15 0715 11/29/15 0556 11/30/15 0637 11/30/15 1940 12/01/15 0553  NA 144 147*  --  141 142 143  --  144  K 2.6* 2.9*  < > 4.6 4.0 2.6* 3.6 3.5  CL 99* 103  --  103 102 98*  --  96*  CO2 32 36*  --  31 36* 38*  --  38*  GLUCOSE 678* 386*  --  313* 372* 263*  --  246*  BUN 24* 21*  --  19 21* 21*  --  25*  CREATININE 1.19* 1.03*  --  0.97 0.98 0.92  --  0.94  CALCIUM 9.1 8.8*  --  8.8* 8.8* 8.8*  --  9.2  MG 2.1  --   --   --   --  2.0  --   --   < > = values in this interval not displayed.   CBC:  Recent  Labs Lab 11/26/15 1834 11/27/15 0546 11/30/15 0637  WBC 6.9 6.6 6.5  NEUTROABS  --   --  5.7  HGB 9.6* 8.7* 8.8*  HCT 31.5* 28.0* 29.3*  MCV 75.6* 74.9* 75.1*  PLT 105* 97* 113*      Microbiology:  No results found for this or any previous visit (from the past 720 hour(s)).  Coagulation Studies: No results for input(s): LABPROT, INR in the last 72 hours.  Urinalysis: No results for input(s): COLORURINE, LABSPEC, PHURINE, GLUCOSEU, HGBUR, BILIRUBINUR, KETONESUR, PROTEINUR, UROBILINOGEN, NITRITE, LEUKOCYTESUR in the last 72 hours.  Invalid input(s): APPERANCEUR    Imaging: No results found.   Medications:     . antiseptic oral rinse  7 mL Mouth Rinse BID  . aspirin  81 mg Oral Daily  . cloNIDine  0.3 mg Oral BID  . enoxaparin (LOVENOX) injection  40 mg Subcutaneous Q12H  . furosemide  40 mg Intravenous Q12H  . hydrALAZINE  50 mg Oral TID  . insulin aspart  0-15 Units Subcutaneous TID WC  . insulin aspart  0-5 Units Subcutaneous QHS  . insulin aspart  16 Units Subcutaneous TID WC  . insulin glargine  40 Units Subcutaneous QHS  . irbesartan  75 mg Oral Daily  . isosorbide mononitrate  60 mg Oral Daily  . labetalol  300 mg Oral BID  . sodium chloride  3 mL Intravenous Q12H  . spironolactone  25 mg Oral Daily   acetaminophen **OR** acetaminophen, hydrALAZINE, HYDROcodone-acetaminophen, ondansetron **OR** ondansetron (ZOFRAN) IV, oxyCODONE-acetaminophen, polyethylene glycol  Assessment/ Plan:  59 y.o. female with a PMHX of long-standing hypertension, hip arthritis, stroke, with residual left-sided weakness, was admitted on 11/26/2015 with worsening generalized edema, dyspnea on exertion, found to have hypokalemia upon admission. Ultrasound of the abdomen shows large mass in right hepatic lobe, concerning for hepatic carcinoma  1. Generalized edema  2. Hypokalemia 3. Hypernatremia 4. Glucosuria, hyperglycemia 5. Severe hypertension 6. Liver mass with metastases by  ultrasound, CT of the abdomen  7. Anemia, unspecified 8. Thrombocytopenia  9. Proteinuria, non nephrotic urine pro/cr 1.3 10. New diagnosis of DM-2 (HbA1c 9.5%)  Patient presents as an interesting case. She has been having worsening of generalized edema for the last few months now. She has incidental finding of right hepatic mass concerning for hepatic carcinoma. She also has incidental findings of glucosuria, hematuria and hyperglycemia. She has thrombocytopenia and anemia which go along liver disease. She has severe hypokalemia. Potassium is being replaced aggressively at present.  Plan: In regards to her underlying edema this appears to be a bit less tight than before.  She has diuresed well and had 3.3 L of urine output yesterday.  We will continue the patient onLasix 40 mg IV every 12 hours for now.  Consider transitioning to by mouth tomorrow.  We will start the patient on a standing order of potassium and chloride as welland she is on high dose Lasix.  Continue to monitor electrolytes while on Lasix.  Awaiting results of iver biopsy.   LOS: 5 Makel Mcmann 12/14/20165:14 PM

## 2015-12-01 NOTE — Progress Notes (Signed)
Patient Name: Renee Wells Date of Encounter: 12/01/2015    Principal Problem:   Acute diastolic CHF (congestive heart failure) ([place]) Active Problems:   Hypokalemia   Metastatic cancer ([place])   Anasarca   Malignant hypertension   Type II diabetes mellitus ([place])   Morbid obesity ([facility])   Dyspnea on exertion   Liver mass   Transaminitis    SUBJECTIVE  Breathing stable though still edematous.  No chest pain.  CURRENT MEDS . antiseptic oral rinse  7 mL Mouth Rinse BID  . aspirin  81 mg Oral Daily  . cloNIDine  0.3 mg Oral BID  . enoxaparin (LOVENOX) injection  40 mg Subcutaneous Q12H  . furosemide  40 mg Intravenous Q12H  . hydrALAZINE  50 mg Oral TID  . insulin aspart  0-15 Units Subcutaneous TID WC  . insulin aspart  0-5 Units Subcutaneous QHS  . insulin aspart  16 Units Subcutaneous TID WC  . insulin glargine  40 Units Subcutaneous QHS  . irbesartan  75 mg Oral Daily  . isosorbide mononitrate  60 mg Oral Daily  . labetalol  200 mg Oral BID  . sodium chloride  3 mL Intravenous Q12H  . spironolactone  25 mg Oral Daily    OBJECTIVE  Filed Vitals:   11/30/15 1117 11/30/15 2005 12/01/15 0532 12/01/15 0640  BP: 148/74 150/68 148/66   Pulse: 81 95 77   Temp: 97.9 F (36.6 C) 98.6 F (37 C) 98 F (36.7 C)   TempSrc: Oral Oral Oral   Resp: '18 19 16   '$ Height:      Weight:   265 lb 9.6 oz (120.475 kg) 258 lb (117.028 kg)  SpO2: 100% 91% 97%     Intake/Output Summary (Last 24 hours) at 12/01/15 1027 Last data filed at 12/01/15 0840  Gross per 24 hour  Intake    480 ml  Output   4500 ml  Net  -4020 ml   Filed Weights   11/30/15 0528 12/01/15 0532 12/01/15 0640  Weight: 260 lb 3.2 oz (118.026 kg) 265 lb 9.6 oz (120.475 kg) 258 lb (117.028 kg)    PHYSICAL EXAM  General: Pleasant, NAD Psych: Normal affect. Neuro: Alert and oriented X 3. Moves all extremities spontaneously - left sided wkns. HEENT: Normal Neck: Supple without bruits. Obese,  difficult to gauge jvp. Lungs: Resp regular and unlabored, bibasilar crackles. Heart: RRR no s3, s4, or murmurs. Abdomen: Soft, non-tender, non-distended, BS + x 4.  Extremities: No clubbing, cyanosis. 2+ bilat LE edema lower calf. DP/PT/Radials 2+ and equal bilaterally.  Accessory Clinical Findings  CBC  Recent Labs  11/30/15 0637  WBC 6.5  NEUTROABS 5.7  HGB 8.8*  HCT 29.3*  MCV 75.1*  PLT 619*   Basic Metabolic Panel  Recent Labs  11/30/15 0637 11/30/15 1940 12/01/15 0553  NA 143  --  144  K 2.6* 3.6 3.5  CL 98*  --  96*  CO2 38*  --  38*  GLUCOSE 263*  --  246*  BUN 21*  --  25*  CREATININE 0.92  --  0.94  CALCIUM 8.8*  --  9.2  MG 2.0  --   --    Liver Function Tests  Recent Labs  11/30/15 0637  AST 33  ALT 49  ALKPHOS 237*  BILITOT 0.8  PROT 5.9*  ALBUMIN 3.1*   TELE  RSR  Radiology/Studies  Ct Pelvis W Contrast  11/27/2015  CLINICAL DATA:  Hepatic mass discovered  on ultrasound. Concern for hepatic malignancy. EXAM: CT ABDOMEN WITHOUT AND WITH CONTRAST CT pelvis with contrast. TECHNIQUE: Multidetector CT imaging of the abdomen was performed following the standard protocol before and following the bolus administration of intravenous contrast. CT the pelvis performed following IV contrast. CONTRAST:  181m OMNIPAQUE IOHEXOL 350 MG/ML SOLN COMPARISON:  Ultrasound 11/27/2015, radiograph 11/26/2015 FINDINGS: Lower chest: Multiple round pulmonary nodules at the lung bases of varying size consistent with lung metastasis. Example lesion in the RIGHT lower lobe measures 10 mm image 10, series 3. Example lesion LEFT lower lobe measures 11 mm on image 4, series 3. Hepatobiliary: Large RIGHT hepatic lobe mass occupying the near entirety of the posterior RIGHT hepatic lobe (segments 5, 6 and 7). Mass measures 12 cm x 12 cm in axial dimension and 17 cm in craniocaudad dimension. RIGHT hepatic lobe mass is partially exophytic and extends inferiorly and compresses  the RIGHT kidney and extend into the space between the RIGHT kidney and the IVC. Several nodular extension this at this level (image 112, series 8). There is a smaller lesion in the LEFT hepatic lobe measuring 2.2 cm on image 48, series 8. The LEFT portal vein is patent. 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 Main portal vein is patent. Pancreas: Pancreas is normal. No ductal dilatation. No pancreatic inflammation. Spleen: Normal spleen Adrenals/urinary tract: The the RIGHT adrenal gland is not identified secondary to the hepatic mass. The RIGHT kidney is depressed inferiorly. The LEFT kidney adrenal gland appear normal. Stomach/Bowel: Stomach, small bowel, appendix, and cecum are normal. The colon and rectosigmoid colon are normal. Vascular/Lymphatic: No clear lymphadenopathy. Nodule lesion between the IVC in the RIGHT kidney on image 112 is felt to be extension of the hepatic tumor itself. No pelvic lymphadenopathy. Reproductive: The fundus of the uterus has enhancing mass measuring 7.8 x 9.2 by 6.7 cm. No adnexal abnormality is obvious. Other: No free fluid. Musculoskeletal: There is an aggressive lesion along the anterior margin of the RIGHT iliac bone. This lesion or erupts through the cortex and measures 4.0 by 4.6 cm on image 164, series 8. The sclerosis of the SI joints. IMPRESSION: 1. Large RIGHT hepatic lobe mass which extends inferior to the pararenal space on the RIGHT. 2. 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 LEFT hepatic lobe lesion additionally. 3. Bilateral lower lobe pulmonary metastasis. 4. 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 5. Enhancing mass in the uterine fundus is likely a benign leiomyoma. Cannot exclude leiomyosarcoma given the extensive metastatic disease in no clear primary. 6. Differential considerations would include primary hepatocellular carcinoma versus metastatic disease from unknown primary. Recommend biopsying liver mass and FDG PET scan. Electronically  Signed   By: SSuzy BouchardM.D.   On: 11/27/2015 14:21   Ct Abd Wo & W Cm  11/27/2015  CLINICAL DATA:  Hepatic mass discovered on ultrasound. Concern for hepatic malignancy. EXAM: CT ABDOMEN WITHOUT AND WITH CONTRAST CT pelvis with contrast. TECHNIQUE: Multidetector CT imaging of the abdomen was performed following the standard protocol before and following the bolus administration of intravenous contrast. CT the pelvis performed following IV contrast. CONTRAST:  1027mOMNIPAQUE IOHEXOL 350 MG/ML SOLN COMPARISON:  Ultrasound 11/27/2015, radiograph 11/26/2015 FINDINGS: Lower chest: Multiple round pulmonary nodules at the lung bases of varying size consistent with lung metastasis. Example lesion in the RIGHT lower lobe measures 10 mm image 10, series 3. Example lesion LEFT lower lobe measures 11 mm on image 4, series 3. Hepatobiliary: Large RIGHT hepatic lobe  mass occupying the near entirety of the posterior RIGHT hepatic lobe (segments 5, 6 and 7). Mass measures 12 cm x 12 cm in axial dimension and 17 cm in craniocaudad dimension. RIGHT hepatic lobe mass is partially exophytic and extends inferiorly and compresses the RIGHT kidney and extend into the space between the RIGHT kidney and the IVC. Several nodular extension this at this level (image 112, series 8). There is a smaller lesion in the LEFT hepatic lobe measuring 2.2 cm on image 48, series 8. The LEFT portal vein is patent. 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 Main portal vein is patent. Pancreas: Pancreas is normal. No ductal dilatation. No pancreatic inflammation. Spleen: Normal spleen Adrenals/urinary tract: The the RIGHT adrenal gland is not identified secondary to the hepatic mass. The RIGHT kidney is depressed inferiorly. The LEFT kidney adrenal gland appear normal. Stomach/Bowel: Stomach, small bowel, appendix, and cecum are normal. The colon and rectosigmoid colon are normal. Vascular/Lymphatic: No clear lymphadenopathy. Nodule lesion between  the IVC in the RIGHT kidney on image 112 is felt to be extension of the hepatic tumor itself. No pelvic lymphadenopathy. Reproductive: The fundus of the uterus has enhancing mass measuring 7.8 x 9.2 by 6.7 cm. No adnexal abnormality is obvious. Other: No free fluid. Musculoskeletal: There is an aggressive lesion along the anterior margin of the RIGHT iliac bone. This lesion or erupts through the cortex and measures 4.0 by 4.6 cm on image 164, series 8. The sclerosis of the SI joints. IMPRESSION: 1. Large RIGHT hepatic lobe mass which extends inferior to the pararenal space on the RIGHT. 2. 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 LEFT hepatic lobe lesion additionally. 3. Bilateral lower lobe pulmonary metastasis. 4. 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 5. Enhancing mass in the uterine fundus is likely a benign leiomyoma. Cannot exclude leiomyosarcoma given the extensive metastatic disease in no clear primary. 6. Differential considerations would include primary hepatocellular carcinoma versus metastatic disease from unknown primary. Recommend biopsying liver mass and FDG PET scan. Electronically Signed   By: Suzy Bouchard M.D.   On: 11/27/2015 14:21   Ct Biopsy  11/29/2015  CLINICAL DATA:  Hepatic mass EXAM: CT GUIDED CORE BIOPSY OF HEPATIC MASS ANESTHESIA/SEDATION: NONE PROCEDURE: The procedure risks, benefits, and alternatives were explained to the patient. Questions regarding the procedure were encouraged and answered. The patient understands and consents to the procedure. The right anterior abdominal wall was prepped with chlorhexidinein a sterile fashion, and a sterile drape was applied covering the operative field. A sterile gown and sterile gloves were used for the procedure. Local anesthesia was provided with 1% Lidocaine. Utilizing CT fluoroscopic guidance a 17 gauge guiding needle was placed percutaneously into the anterior aspect of the right lobe of the liver. It was  placed at the margin of the hypodense mass lesion posteriorly. Multiple 18 gauge core biopsies were then obtained. The guiding needle was then removed in Gel-Foam placed to aid in hemostasis. No post procedure hematoma is noted. The patient tolerated the procedure well and was returned to her room in satisfactory condition. COMPLICATIONS: None immediate IMPRESSION: Successful CT-guided liver biopsy as described. Electronically Signed   By: Inez Catalina M.D.   On: 11/29/2015 13:53   Dg Chest Portable 1 View  11/26/2015  CLINICAL DATA:  59 year old with progressively worsening shortness of breath and bilateral upper extremity and lower extremity edema over the past few days. EXAM: PORTABLE CHEST 1 VIEW COMPARISON:  06/03/2014. FINDINGS: Marked elevation of the  right hemidiaphragm with scar/atelectasis at the right lung base. Cardiac silhouette mildly to moderately enlarged. Pulmonary venous hypertension without overt edema. No visible pleural effusions. IMPRESSION: 1. Cardiomegaly. Pulmonary venous hypertension without overt edema currently. 2. Elevation of the right hemidiaphragm with scar/atelectasis at the right lung base. Electronically Signed   By: Evangeline Dakin M.D.   On: 11/26/2015 17:56   Dg Hand Complete Left  11/26/2015  CLINICAL DATA:  Fall onto left hand. Left hand swelling and pain. Initial encounter. EXAM: LEFT HAND - COMPLETE 3+ VIEW COMPARISON:  None. FINDINGS: There is no evidence of fracture or dislocation. Mild degenerative spurring is seen involving the metacarpophalangeal joint of the thumb. No other bone abnormality identified. Marked soft tissue swelling is seen involving the dorsum of the hand and the wrist. IMPRESSION: Marked hand and wrist soft tissue swelling. No evidence of fracture or dislocation. Electronically Signed   By: Earle Gell M.D.   On: 11/26/2015 20:04   Dg Hip Unilat  With Pelvis 2-3 Views Right  11/06/2015  CLINICAL DATA:  Chronic hip pain exacerbated by  activity. Pain for 2 weeks. EXAM: DG HIP (WITH OR WITHOUT PELVIS) 2-3V RIGHT COMPARISON:  None. FINDINGS: The cortical margins of the bony pelvis are intact. No fracture. Pubic symphysis and sacroiliac joints are congruent. Both femoral heads are well-seated in the respective acetabula. Mild-to-moderate degenerative change of both hips with acetabular osteophytes. 6.7 cm elongated ossified density adjacent to the right anterior superior iliac spine is consistent with an avulsion injury, of indeterminate age. IMPRESSION: 1. Age-indeterminate avulsion injury from the right anterior superior iliac spine. 2. 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 Electronically Signed   By: Jeb Levering M.D.   On: 11/06/2015 02:00   US Abdomen Limited Ruq  11/27/2015  CLINICAL DATA:  Elevated liver enzymes EXAM: US ABDOMEN LIMITED - RIGHT UPPER QUADRANT COMPARISON:  None. FINDINGS: Gallbladder: The gallbladder is predominantly contracted. No gallbladder wall thickening or pericholecystic fluid. Negative sonographic Murphy's sign per Common bile duct: Diameter: Normal at 4 mm. Liver: There is a large round heterogeneous solid-appearing mass within the RIGHT hepatic lobe measuring 18 by 11 by 12. This mass bulges the liver capsule. Additional smaller lesions noted in the LEFT hepatic lobe measuring up to 3.5 cm. No biliary duct dilatation. Mild heterogeneity of the liver parenchyma. IMPRESSION: 1. 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 Recommend CT of the abdomen and pelvis with contrast for further evaluation. 2. Contracted gallbladder without evidence of cholecystitis. These results will be called to the ordering clinician or representative by the Radiologist Assistant, and communication documented in the PACS or zVision Dashboard. Electronically Signed   By: Suzy Bouchard M.D.   On: 11/27/2015 09:35   2D Echocardiogram  12.10.2016  Study Conclusions  - Left ventricle: Systolic function was normal. The estimated ejection fraction was in the range of 55% to 65%. _____________   ASSESSMENT AND PLAN  1. Acute Diastolic CHF/Anasarca: Pt was admitted 12/9 with a several month h/o progressive LEE and wt gain (admitted @ 2 - was 248 06/2015) that became associated with worsening DOE, orthopnea, anorexia, and early satiety beginning approx 10 days prior to admission.  Echo this admission has shown nl LV fxn. No mention of diast dysfxn. Only trivial MR/TR.  Nephrology has been following and managing diuretics in setting of significant hypokalemia and now mild hypochloremia.  Net neg 10.3L for admission  with reduction in wt from 268 to 258 lbs.  Prior wt of 248 lbs in 06/2015. BUN/Creat up very slightly with bicarb of 38. Cont IV lasix as Rx @ current dose. Cont aggressive BP mgmt with bb, arb, spriro (tolerating thus far after two doses - suspect nausea more a factor of CHF/passive congestion), hydralazine, nitrate, clonidine. Prev on amlodipine - d/c'd 2/2 potential contribution to LEE.  Hard to know what role metastatic CA with liver mass and R portal vein occlusion is playing in anasarca.  2. Metastatic Cancer: LFT elevation lead to abd u/s revealing large hepatic mass w/ subsequent CT of abd/pelvis with findings of bilat hepatic lobe masses, occlusion of R portal vein, bilat LL pulm mets, right iliac wing lesion w/ fx, and uterine fundal mass. S/p liver Bx on 12/12 - path pending. Oncology following.  3. Malignant Hypertension: See # 1. BP's generally improved - 140's to 150's over past 24 hrs. Cont current meds.  -Titrate labetalol to 300 bid.  4. Newly Dx Type II DM: A1c 5.8 in July, now 9.5. -Lantus, novolog, and SSI started by endocrine.  5. Hypokalemia: Supplementation ordered per medicine/nephrology.  Prior aldosterone/renin w/u unrevealing.   6. Microcytic anemia: H/H down  since July - currently 8.8/29.3 (was 12.8/40.3). Microcytic/hypochromic then and now. Low nl serum iron, nl ferritin.  Signed, Murray Hodgkins NP

## 2015-12-01 NOTE — Progress Notes (Signed)
   12/01/15 1120 12/01/15 1121  Oxygen Therapy  SpO2 (!) 87 % 93 %  O2 Device Room Air Nasal Cannula  O2 Flow Rate (L/min) --  2 L/min

## 2015-12-01 NOTE — Progress Notes (Signed)
MEDICATION RELATED CONSULT NOTE - INITIAL   Pharmacy Consult for electrolyte management Indication: hypokalemia  Allergies  Allergen Reactions  . Penicillins Itching   Patient Measurements: Height: '5\' 5"'$  (165.1 cm) Weight: 258 lb (117.028 kg) IBW/kg (Calculated) : 57  Vital Signs: Temp: 98 F (36.7 C) (12/14 0532) Temp Source: Oral (12/14 0532) BP: 148/66 mmHg (12/14 0532) Pulse Rate: 77 (12/14 0532) Intake/Output from previous day: 12/13 0701 - 12/14 0700 In: 480 [P.O.:480] Out: 3350 [Urine:3350] Intake/Output from this shift: Total I/O In: -  Out: 400 [Urine:400]  Labs:  Recent Labs  11/28/15 1154 11/29/15 0556 11/30/15 0637 12/01/15 0553  WBC  --   --  6.5  --   HGB  --   --  8.8*  --   HCT  --   --  29.3*  --   PLT  --   --  113*  --   APTT 22.5*  --   --   --   CREATININE  --  0.98 0.92 0.94  MG  --   --  2.0  --   ALBUMIN  --   --  3.1*  --   PROT  --   --  5.9*  --   AST  --   --  33  --   ALT  --   --  49  --   ALKPHOS  --   --  237*  --   BILITOT  --   --  0.8  --    Estimated Creatinine Clearance: 81.4 mL/min (by C-G formula based on Cr of 0.94).  Medical History: Past Medical History  Diagnosis Date  . Stroke [facility])     a. 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  . Malignant hypertension     a. Admitted 06/2015 and 11/2015;  b. 06/2015 nl aldosterone/renin activity;  c. Polypharmacy.  [name] HLD (hyperlipidemia)   . Morbid obesity ([place])   . Anasarca     a. 11/2015 Echo: EF 55-65%, triv TR/MR.  [name] Hypokalemia   . Type II diabetes mellitus ([place])     a. 11/2015 A1c 9.5.  . Metastatic cancer ([place])     a. 11/2015 CT abd/pelvis: large R hepatic lobe mass extending inf to pararenal space on R. Complete occlusion of R portal vein. L hepatic lobe lesion. Bilat LL pulm mets. Skeletal mets 2/ Fx of R iliac wing. Enhancing uterine fundal mass.   Assessment: Pharmacy consulted to monitor and replace electrolytes in this 59 year old female with  metastatic cancer. Patient had a K of 2.6 on admission and has a history of hypokalemia. She was prescribed K-Dur 20 mEq PO BID on admission.  K this AM is within normal limits at 3.5  Goal of Therapy:  K 3.5-5.0  Plan:  No supplementation needed at this time, have ordered K & phos with AM labs tomorrow morning.  Will discontinue K-Dur 20 mEq PO BID as K is now within normal limits, patient was not taking supplementation prior to admission, and patient's home dose of spironolactone was restarted yesterday which can cause hyperkalemia.  Pharmacy will continue to follow. Thank you for the consult.  Darylene Price Ardenia Stiner 12/01/2015,7:50 AM

## 2015-12-02 LAB — MAGNESIUM: Magnesium: 1.9 mg/dL (ref 1.7–2.4)

## 2015-12-02 LAB — BASIC METABOLIC PANEL
ANION GAP: 8 (ref 5–15)
BUN: 23 mg/dL — AB (ref 6–20)
CALCIUM: 8.8 mg/dL — AB (ref 8.9–10.3)
CO2: 39 mmol/L — AB (ref 22–32)
Chloride: 93 mmol/L — ABNORMAL LOW (ref 101–111)
Creatinine, Ser: 0.99 mg/dL (ref 0.44–1.00)
GFR calc Af Amer: >60 mL/min (ref >60)
GFR calc non Af Amer: >60 mL/min (ref >60)
GLUCOSE: 151 mg/dL — AB (ref 65–99)
Potassium: 2.5 mmol/L — CL (ref 3.5–5.1)
Sodium: 140 mmol/L (ref 135–145)

## 2015-12-02 LAB — CBC WITH DIFFERENTIAL/PLATELET
BASOS PCT: 0 %
Basophils Absolute: 0 10*3/uL (ref 0–0.1)
EOS PCT: 0 %
Eosinophils Absolute: 0 10*3/uL (ref 0–0.7)
HEMATOCRIT: 28.7 % — AB (ref 35.0–47.0)
Hemoglobin: 8.8 g/dL — ABNORMAL LOW (ref 12.0–16.0)
Lymphocytes Relative: 6 %
Lymphs Abs: 0.4 10*3/uL — ABNORMAL LOW (ref 1.0–3.6)
MCH: 22.9 pg — ABNORMAL LOW (ref 26.0–34.0)
MCHC: 30.6 g/dL — AB (ref 32.0–36.0)
MCV: 74.7 fL — ABNORMAL LOW (ref 80.0–100.0)
MONO ABS: 0.6 10*3/uL (ref 0.2–0.9)
MONOS PCT: 9 %
NEUTROS ABS: 5.3 10*3/uL (ref 1.4–6.5)
Neutrophils Relative %: 85 %
Platelets: 131 10*3/uL — ABNORMAL LOW (ref 150–440)
RBC: 3.85 MIL/uL (ref 3.80–5.20)
RDW: 19.1 % — ABNORMAL HIGH (ref 11.5–14.5)
WBC: 6.3 10*3/uL (ref 3.6–11.0)

## 2015-12-02 LAB — PHOSPHORUS: Phosphorus: 4.1 mg/dL (ref 2.5–4.6)

## 2015-12-02 LAB — GLUCOSE, CAPILLARY
Glucose-Capillary: 131 mg/dL — ABNORMAL HIGH (ref 65–99)
Glucose-Capillary: 191 mg/dL — ABNORMAL HIGH (ref 65–99)

## 2015-12-02 LAB — POTASSIUM: POTASSIUM: 3.8 mmol/L (ref 3.5–5.1)

## 2015-12-02 MED ORDER — POTASSIUM CHLORIDE CRYS ER 20 MEQ PO TBCR: 20.0000 meq | EXTENDED_RELEASE_TABLET | Freq: Two times a day (BID) | ORAL | Status: DC

## 2015-12-02 MED ORDER — LISINOPRIL 20 MG PO TABS: 40.0000 mg | ORAL_TABLET | Freq: Every day | ORAL | Status: DC

## 2015-12-02 MED ORDER — POTASSIUM CHLORIDE CRYS ER 20 MEQ PO TBCR
40.0000 meq | EXTENDED_RELEASE_TABLET | Freq: Three times a day (TID) | ORAL | Status: DC
  Administered 2015-12-02 (×3): 40 meq via ORAL
  Filled 2015-12-02 (×3): qty 2

## 2015-12-02 MED ORDER — MAGNESIUM SULFATE 2 GM/50ML IV SOLN
2.0000 g | Freq: Once | INTRAVENOUS | Status: AC
  Administered 2015-12-02: 2 g via INTRAVENOUS
  Filled 2015-12-02: qty 50

## 2015-12-02 MED ORDER — POTASSIUM CHLORIDE CRYS ER 20 MEQ PO TBCR
40.0000 meq | EXTENDED_RELEASE_TABLET | Freq: Once | ORAL | Status: AC
  Administered 2015-12-02: 40 meq via ORAL
  Filled 2015-12-02: qty 2

## 2015-12-02 MED ORDER — POTASSIUM CHLORIDE 10 MEQ/100ML IV SOLN
10.0000 meq | INTRAVENOUS | Status: DC
  Administered 2015-12-02: 10 meq via INTRAVENOUS
  Filled 2015-12-02 (×5): qty 100

## 2015-12-02 MED ORDER — FUROSEMIDE 40 MG PO TABS
40.0000 mg | ORAL_TABLET | Freq: Two times a day (BID) | ORAL | Status: DC
  Administered 2015-12-02 – 2015-12-03 (×2): 40 mg via ORAL
  Filled 2015-12-02 (×2): qty 1

## 2015-12-02 NOTE — Progress Notes (Signed)
ENDOCRINOLOGY FOLLOW-UP  REFERRING PHYSICIAN: Demetrios Loll, MD CONSULTING PHYSICIAN:  A. Lavone Orn, MD.  CHIEF COMPLAINT:  Diabetes  HISTORY OF PRESENT ILLNESS:  59 y.o. female seen in follow up for for a new diagnosis of diabetes mellitus, with Hgb A1c 9.5%.    Now receiving Lantus insulin 40 units qhs, NovoLog 16 units tid AC + NovoLog SSI qACHS. Fasting sugar today was 131. Prior 24 hrs sugar ranged 93-228.    She has a good appetite. Eating 100% of meal trays. No N/V. No acute complaints. No weakness. No bowel complaints.    PAST MEDICAL HISTORY:  Past Medical History  Diagnosis Date  . Stroke [facility])     a. 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  . Malignant hypertension     a. Admitted 06/2015 and 11/2015;  b. 06/2015 nl aldosterone/renin activity;  c. Polypharmacy.  [name] HLD (hyperlipidemia)   . Morbid obesity ([place])   . Anasarca     a. 11/2015 Echo: EF 55-65%, triv TR/MR.  [name] Hypokalemia   . Type II diabetes mellitus ([place])     a. 11/2015 A1c 9.5.  . Metastatic cancer ([place])     a. 11/2015 CT abd/pelvis: large R hepatic lobe mass extending inf to pararenal space on R. Complete occlusion of R portal vein. L hepatic lobe lesion. Bilat LL pulm mets. Skeletal mets 2/ Fx of R iliac wing. Enhancing uterine fundal mass.     CURRENT MEDICATIONS:   Current facility-administered medications:  .  acetaminophen (TYLENOL) tablet 650 mg, 650 mg, Oral, Q6H PRN **OR** acetaminophen (TYLENOL) suppository 650 mg, 650 mg, Rectal, Q6H PRN, Aldean Jewett, MD .  antiseptic oral rinse (CPC / CETYLPYRIDINIUM CHLORIDE 0.05%) solution 7 mL, 7 mL, Mouth Rinse, BID, Aldean Jewett, MD, 7 mL at 12/02/15 0929 .  aspirin chewable tablet 81 mg, 81 mg, Oral, Daily, Demetrios Loll, MD, 81 mg at 12/02/15 0917 .  cloNIDine (CATAPRES) tablet 0.3 mg, 0.3 mg, Oral, BID, Aldean Jewett, MD, 0.3 mg at 12/02/15 3785 .  enoxaparin (LOVENOX) injection 40  mg, 40 mg, Subcutaneous, Q12H, Demetrios Loll, MD, 40 mg at 12/02/15 8850 .  furosemide (LASIX) tablet 40 mg, 40 mg, Oral, BID, Loletha Grayer, MD .  hydrALAZINE (APRESOLINE) injection 10 mg, 10 mg, Intravenous, Q6H PRN, Demetrios Loll, MD .  hydrALAZINE (APRESOLINE) tablet 50 mg, 50 mg, Oral, TID, Murlean Iba, MD, 50 mg at 12/02/15 0917 .  HYDROcodone-acetaminophen (NORCO/VICODIN) 5-325 MG per tablet 1-2 tablet, 1-2 tablet, Oral, Q4H PRN, Inez Catalina, MD, 1 tablet at 11/30/15 0902 .  insulin aspart (novoLOG) injection 0-15 Units, 0-15 Units, Subcutaneous, TID WC, Judi Cong, MD, 2 Units at 12/02/15 0818 .  insulin aspart (novoLOG) injection 0-5 Units, 0-5 Units, Subcutaneous, QHS, Demetrios Loll, MD, 5 Units at 11/30/15 2155 .  insulin aspart (novoLOG) injection 16 Units, 16 Units, Subcutaneous, TID WC, Judi Cong, MD, 16 Units at 12/02/15 [phone]  insulin glargine (LANTUS) injection 40 Units, 40 Units, Subcutaneous, QHS, Judi Cong, MD, 40 Units at 12/01/15 2235 .  irbesartan (AVAPRO) tablet 75 mg, 75 mg, Oral, Daily, Murlean Iba, MD, 75 mg at 12/02/15 0918 .  isosorbide mononitrate (IMDUR) 24 hr tablet 60 mg, 60 mg, Oral, Daily, Aldean Jewett, MD, 60 mg at 12/02/15 1287 .  labetalol (NORMODYNE) tablet 300 mg, 300 mg, Oral, BID, Rogelia Mire, NP, 300 mg at 12/02/15 0917 .  ondansetron (ZOFRAN) tablet 4 mg, 4 mg, Oral, Q6H PRN **OR**  ondansetron (ZOFRAN) injection 4 mg, 4 mg, Intravenous, Q6H PRN, Aldean Jewett, MD .  oxyCODONE-acetaminophen (PERCOCET/ROXICET) 5-325 MG per tablet 1 tablet, 1 tablet, Oral, Q4H PRN, Aldean Jewett, MD, 1 tablet at 12/01/15 2017 .  polyethylene glycol (MIRALAX / GLYCOLAX) packet 17 g, 17 g, Oral, Daily PRN, Aldean Jewett, MD .  Derrill Memo ON 12/03/2015] potassium chloride SA (K-DUR,KLOR-CON) CR tablet 20 mEq, 20 mEq, Oral, BID, Lenis Noon, [facility] .  potassium chloride SA (K-DUR,KLOR-CON) CR tablet 40 mEq, 40 mEq, Oral, TID, Lenis Noon, RPH, 40 mEq at  12/02/15 0818 .  sodium chloride 0.9 % injection 3 mL, 3 mL, Intravenous, Q12H, Aldean Jewett, MD, 3 mL at 12/02/15 9242 .  spironolactone (ALDACTONE) tablet 25 mg, 25 mg, Oral, Daily, Demetrios Loll, MD, 25 mg at 12/02/15 6834    SOCIAL HISTORY:  Social History  Substance Use Topics  . Smoking status: Never Smoker   . Smokeless tobacco: None  . Alcohol Use: No     Comment: occasional    FAMILY HISTORY:   Family History  Problem Relation Age of Onset  . Heart disease Mother   . Hypertension Mother   . Diabetes Mother   . Diabetes Sister   . Hyperlipidemia Sister     ALLERGIES:  Allergies  Allergen Reactions  . Penicillins Itching    PHYSICAL EXAMINATION:  BP 143/70 mmHg  Pulse 77  Temp(Src) 98.2 F (36.8 C) (Oral)  Resp 18  Ht '5\' 5"'$  (1.651 m)  Wt 114.397 kg (252 lb 3.2 oz)  BMI 41.97 kg/m2  SpO2 97%  GENERAL: Obese AAF in NAD. HEENT:  EOMI.  Oropharynx is clear.  NECK:  Supple.  No thyromegaly.  No neck tenderness.  CARDIAC:  Regular rate and rhythm without murmur.  PULMONARY:  Clear to auscultation bilaterally. No wheeze. ABDOMEN:  Soft, NT. NABS.  SKIN:  No rash or dermatopathy. NEUROLOGIC:  No dysarthria.  No tremor. PSYCHIATRIC:  Alert and oriented, calm, cooperative.   LABORATORY DATA:  Results for orders placed or performed during the hospital encounter of 11/26/15 (from the past 24 hour(s))  Glucose, capillary     Status: None   Collection Time: 12/01/15  4:19 PM  Result Value Ref Range   Glucose-Capillary 93 65 - 99 mg/dL  Glucose, capillary     Status: Abnormal   Collection Time: 12/01/15  9:23 PM  Result Value Ref Range   Glucose-Capillary 166 (H) 65 - 99 mg/dL  Phosphorus     Status: None   Collection Time: 12/02/15  6:07 AM  Result Value Ref Range   Phosphorus 4.1 2.5 - 4.6 mg/dL  Basic metabolic panel     Status: Abnormal   Collection Time: 12/02/15  6:07 AM  Result Value Ref Range   Sodium 140 135 - 145 mmol/L   Potassium 2.5 (LL) 3.5  - 5.1 mmol/L   Chloride 93 (L) 101 - 111 mmol/L   CO2 39 (H) 22 - 32 mmol/L   Glucose, Bld 151 (H) 65 - 99 mg/dL   BUN 23 (H) 6 - 20 mg/dL   Creatinine, Ser 0.99 0.44 - 1.00 mg/dL   Calcium 8.8 (L) 8.9 - 10.3 mg/dL   GFR calc non Af Amer >60 >60 mL/min   GFR calc Af Amer >60 >60 mL/min   Anion gap 8 5 - 15  CBC with Differential/Platelet     Status: Abnormal   Collection Time: 12/02/15  6:07 AM  Result Value Ref  Range   WBC 6.3 3.6 - 11.0 K/uL   RBC 3.85 3.80 - 5.20 MIL/uL   Hemoglobin 8.8 (L) 12.0 - 16.0 g/dL   HCT 28.7 (L) 35.0 - 47.0 %   MCV 74.7 (L) 80.0 - 100.0 fL   MCH 22.9 (L) 26.0 - 34.0 pg   MCHC 30.6 (L) 32.0 - 36.0 g/dL   RDW 19.1 (H) 11.5 - 14.5 %   Platelets 131 (L) 150 - 440 K/uL   Neutrophils Relative % 85 %   Neutro Abs 5.3 1.4 - 6.5 K/uL   Lymphocytes Relative 6 %   Lymphs Abs 0.4 (L) 1.0 - 3.6 K/uL   Monocytes Relative 9 %   Monocytes Absolute 0.6 0.2 - 0.9 K/uL   Eosinophils Relative 0 %   Eosinophils Absolute 0.0 0 - 0.7 K/uL   Basophils Relative 0 %   Basophils Absolute 0.0 0 - 0.1 K/uL  Magnesium     Status: None   Collection Time: 12/02/15  6:07 AM  Result Value Ref Range   Magnesium 1.9 1.7 - 2.4 mg/dL  Glucose, capillary     Status: Abnormal   Collection Time: 12/02/15  7:22 AM  Result Value Ref Range   Glucose-Capillary 131 (H) 65 - 99 mg/dL    ASSESSMENT:  1. Diabetes mellitus - new diagnosis 2.   Liver masses and uterine mass, concerning for carcinoma. 3.   HTN  PLAN: 1. Glycemic control reasonable. No changes made today in insulin regimen. 2. Continue to monitor sugars qACHS. 3. Appreciate assistance of diabetes coordinators.  4. Continue modified carb diet.   At discharge, she will need the following prescribed LANTUS SOLOSTAR PEN (currently 40 units qHS) #15 ml  NOVOLOG FLEX PEN (currently 16 units tid AC) #15 ml INSULIN PEN NEEDLES FOR USE 4 TIMES DAILY #100 ONE TOUCH ULTRA GLUCOMETER ONE TOUCH ULTRA TEST STRIPS FOR USE 4 TIMES  DAILY DELICA LANCETS FOR USE 4 TIMES DAILY  Will follow along with you and arrange out-patient follow up when she is ready for hospital discharge.

## 2015-12-02 NOTE — Progress Notes (Signed)
Initial appointment made at the [facility] on December 29, 2015 at 9:00am. Thank you.

## 2015-12-02 NOTE — Progress Notes (Signed)
Spoke with dr. Leslye Peer to make aware patient is unable to tolerate iv potassium. Currently on po potasium, per dr. Leslye Peer discontinue iv potassium.

## 2015-12-02 NOTE — Progress Notes (Signed)
Subjective:  Lower extremity edema continues to improve. Recurrent hypokalemia noted which is expected. Good urine output noted. Case discussed with hospitalist today.   Objective:  Vital signs in last 24 hours:  Temp:  [98.2 F (36.8 C)-98.7 F (37.1 C)] 98.2 F (36.8 C) (12/15 0453) Pulse Rate:  [59-91] 59 (12/15 0453) Resp:  [16-18] 18 (12/15 0453) BP: (125-177)/(59-79) 177/59 mmHg (12/15 0453) SpO2:  [87 %-99 %] 97 % (12/15 0453) Weight:  [114.397 kg (252 lb 3.2 oz)] 114.397 kg (252 lb 3.2 oz) (12/15 0453)  Weight change: -6.078 kg (-13 lb 6.4 oz) Filed Weights   12/01/15 0532 12/01/15 0640 12/02/15 0453  Weight: 120.475 kg (265 lb 9.6 oz) 117.028 kg (258 lb) 114.397 kg (252 lb 3.2 oz)    Intake/Output:    Intake/Output Summary (Last 24 hours) at 12/02/15 1051 Last data filed at 12/02/15 0931  Gross per 24 hour  Intake    530 ml  Output   2850 ml  Net  -2320 ml     Physical Exam: General:  obese lady, laying in the bed   HEENT  moist oral mucous membranes   Neck  supple no masses   Pulm/lungs  normal respiratory effort, scattered rhonchi otherwise clear   CVS/Heart  regular rate and rhythm, no rub or gallop   Abdomen:   soft, distended, nontender   Extremities:  2+ pitting edema, anasarca   Neurologic:  alert, oriented, speech normal   Skin:  no acute rashes           Basic Metabolic Panel:   Recent Labs Lab 11/26/15 1834  11/28/15 0715 11/29/15 0556 11/30/15 0637 11/30/15 1940 12/01/15 0553 12/02/15 0607  NA 144  < > 141 142 143  --  144 140  K 2.6*  < > 4.6 4.0 2.6* 3.6 3.5 2.5*  CL 99*  < > 103 102 98*  --  96* 93*  CO2 32  < > 31 36* 38*  --  38* 39*  GLUCOSE 678*  < > 313* 372* 263*  --  246* 151*  BUN 24*  < > 19 21* 21*  --  25* 23*  CREATININE 1.19*  < > 0.97 0.98 0.92  --  0.94 0.99  CALCIUM 9.1  < > 8.8* 8.8* 8.8*  --  9.2 8.8*  MG 2.1  --   --   --  2.0  --   --  1.9  PHOS  --   --   --   --   --   --   --  4.1  < > = values in  this interval not displayed.   CBC:  Recent Labs Lab 11/26/15 1834 11/27/15 0546 11/30/15 0637 12/02/15 0607  WBC 6.9 6.6 6.5 6.3  NEUTROABS  --   --  5.7 5.3  HGB 9.6* 8.7* 8.8* 8.8*  HCT 31.5* 28.0* 29.3* 28.7*  MCV 75.6* 74.9* 75.1* 74.7*  PLT 105* 97* 113* 131*      Microbiology:  No results found for this or any previous visit (from the past 720 hour(s)).  Coagulation Studies: No results for input(s): LABPROT, INR in the last 72 hours.  Urinalysis: No results for input(s): COLORURINE, LABSPEC, PHURINE, GLUCOSEU, HGBUR, BILIRUBINUR, KETONESUR, PROTEINUR, UROBILINOGEN, NITRITE, LEUKOCYTESUR in the last 72 hours.  Invalid input(s): APPERANCEUR    Imaging: No results found.   Medications:     . antiseptic oral rinse  7 mL Mouth Rinse BID  . aspirin  81  mg Oral Daily  . cloNIDine  0.3 mg Oral BID  . enoxaparin (LOVENOX) injection  40 mg Subcutaneous Q12H  . furosemide  40 mg Oral BID  . hydrALAZINE  50 mg Oral TID  . insulin aspart  0-15 Units Subcutaneous TID WC  . insulin aspart  0-5 Units Subcutaneous QHS  . insulin aspart  16 Units Subcutaneous TID WC  . insulin glargine  40 Units Subcutaneous QHS  . irbesartan  75 mg Oral Daily  . isosorbide mononitrate  60 mg Oral Daily  . labetalol  300 mg Oral BID  . potassium chloride  10 mEq Intravenous Q1 Hr x 5  . [START ON 12/03/2015] potassium chloride  20 mEq Oral BID  . potassium chloride  40 mEq Oral TID  . sodium chloride  3 mL Intravenous Q12H  . spironolactone  25 mg Oral Daily   acetaminophen **OR** acetaminophen, hydrALAZINE, HYDROcodone-acetaminophen, ondansetron **OR** ondansetron (ZOFRAN) IV, oxyCODONE-acetaminophen, polyethylene glycol  Assessment/ Plan:  59 y.o. female with a PMHX of long-standing hypertension, hip arthritis, stroke, with residual left-sided weakness, was admitted on 11/26/2015 with worsening generalized edema, dyspnea on exertion, found to have hypokalemia upon admission.  Ultrasound of the abdomen shows large mass in right hepatic lobe, concerning for hepatic carcinoma  1. Generalized edema  2. Hypokalemia 3. Hypernatremia 4. Glucosuria, hyperglycemia 5. Severe hypertension 6. Liver mass with metastases by ultrasound, CT of the abdomen  7. Anemia, unspecified 8. Thrombocytopenia  9. Proteinuria, non nephrotic urine pro/cr 1.3 10. New diagnosis of DM-2 (HbA1c 9.5%)  Patient presents as an interesting case. She has been having worsening of generalized edema for the last few months now. She has incidental finding of right hepatic mass concerning for hepatic carcinoma. She also has incidental findings of glucosuria, hematuria and hyperglycemia. She has thrombocytopenia and anemia which go along liver disease. She has severe hypokalemia. Potassium is being replaced aggressively at present.  Plan: Patient has had good response to diuretic therapy. Her edema has improved clinically. However she has developed recurrent hypokalemia. She has been placed on potassium chloride 40 mEq by mouth 3 times a day. I agree with discontinuation of IV Lasix and transitioned to by mouth Lasix today. Hypertension continues to be labile. Blood pressures ranged between 125/60-177/59.  Continue clonidine, irbesartan, labetalol, and spironolactone. We will continue to monitor along with you.   LOS: 6 Cordell Guercio 12/15/201610:51 AM

## 2015-12-02 NOTE — Care Management (Signed)
There is a room air sat documented 12/14 of 87%.  The nurse that obtained the sat verbally reported this was at rest and has forgotten to document so not able to use that result to qualify for home 02 unless this staff person can modify the required text.  Will request that sats be reassessed 12/16.  Discussed [facility]  be may a choice for home health if there is a treatment plan including chemo/radiation  and how the plan could transition to a hospice plan of care with minimal interruption in service.  she wants to discuss this with her family .  It is reported waiting on another assessment by oncology but do not see documentation of this.  Attending is investigating.  Patient is now on oral lasix.

## 2015-12-02 NOTE — Progress Notes (Signed)
Patient ID: Renee Wells, female   DOB: 1956/09/12, 59 y.o.   MRN: [id number] [facility] PROGRESS NOTE  PCP: Sharyne Peach, MD  HPI/Subjective: Patient feeling a little bit better. She is urinating very well. Some shortness of breath. Some hip pain. She does not want to go to rehabilitation.  Objective: Filed Vitals:   12/02/15 0453 12/02/15 1144  BP: 177/59 143/70  Pulse: 59 77  Temp: 98.2 F (36.8 C) 98.2 F (36.8 C)  Resp: 18 18    Filed Weights   12/01/15 0532 12/01/15 0640 12/02/15 0453  Weight: 120.475 kg (265 lb 9.6 oz) 117.028 kg (258 lb) 114.397 kg (252 lb 3.2 oz)    ROS: Review of Systems  Constitutional: Negative for fever and chills.  Eyes: Negative for blurred vision.  Respiratory: Positive for shortness of breath. Negative for cough.   Cardiovascular: Negative for chest pain.  Gastrointestinal: Negative for nausea, vomiting, abdominal pain, diarrhea and constipation.  Genitourinary: Negative for dysuria.  Musculoskeletal: Positive for joint pain.  Neurological: Negative for dizziness and headaches.   Exam: Physical Exam  HENT:  Nose: No mucosal edema.  Mouth/Throat: No oropharyngeal exudate or posterior oropharyngeal edema.  Eyes: Conjunctivae, EOM and lids are normal. Pupils are equal, round, and reactive to light.  Neck: No JVD present. Carotid bruit is not present. No edema present. No thyroid mass and no thyromegaly present.  Cardiovascular: S1 normal and S2 normal.  Exam reveals no gallop.   No murmur heard. Pulses:      Dorsalis pedis pulses are 2+ on the right side, and 2+ on the left side.  Respiratory: No respiratory distress. She has no wheezes. She has no rhonchi. She has no rales.  GI: Soft. Bowel sounds are normal. There is no tenderness.  Musculoskeletal:       Right ankle: She exhibits swelling.       Left ankle: She exhibits swelling.  Lymphadenopathy:    She has no cervical adenopathy.  Neurological: She is alert.  No cranial nerve deficit.  Skin: Skin is warm. No rash noted. Nails show no clubbing.  Psychiatric: She has a normal mood and affect.    Data Reviewed: Basic Metabolic Panel:  Recent Labs Lab 11/26/15 1834  11/28/15 0715 11/29/15 0556 11/30/15 5027 11/30/15 1940 12/01/15 0553 12/02/15 0607  NA 144  < > 141 142 143  --  144 140  K 2.6*  < > 4.6 4.0 2.6* 3.6 3.5 2.5*  CL 99*  < > 103 102 98*  --  96* 93*  CO2 32  < > 31 36* 38*  --  38* 39*  GLUCOSE 678*  < > 313* 372* 263*  --  246* 151*  BUN 24*  < > 19 21* 21*  --  25* 23*  CREATININE 1.19*  < > 0.97 0.98 0.92  --  0.94 0.99  CALCIUM 9.1  < > 8.8* 8.8* 8.8*  --  9.2 8.8*  MG 2.1  --   --   --  2.0  --   --  1.9  PHOS  --   --   --   --   --   --   --  4.1  < > = values in this interval not displayed. Liver Function Tests:  Recent Labs Lab 11/26/15 1834 11/30/15 0637  AST 48* 33  ALT 72* 49  ALKPHOS 238* 237*  BILITOT 1.3* 0.8  PROT 6.1* 5.9*  ALBUMIN 3.2* 3.1*  CBC:  Recent Labs Lab 11/26/15 1834 11/27/15 0546 11/30/15 0637 12/02/15 0607  WBC 6.9 6.6 6.5 6.3  NEUTROABS  --   --  5.7 5.3  HGB 9.6* 8.7* 8.8* 8.8*  HCT 31.5* 28.0* 29.3* 28.7*  MCV 75.6* 74.9* 75.1* 74.7*  PLT 105* 97* 113* 131*   Cardiac Enzymes:  Recent Labs Lab 11/26/15 1834  TROPONINI <0.03   BNP (last 3 results)  Recent Labs  06/25/15 2327 11/25/15 1927  BNP 53.0 146.0*   CBG:  Recent Labs Lab 12/01/15 0740 12/01/15 1141 12/01/15 1619 12/01/15 2123 12/02/15 0722  GLUCAP 228* 173* 93 166* 131*    Scheduled Meds: . antiseptic oral rinse  7 mL Mouth Rinse BID  . aspirin  81 mg Oral Daily  . cloNIDine  0.3 mg Oral BID  . enoxaparin (LOVENOX) injection  40 mg Subcutaneous Q12H  . furosemide  40 mg Oral BID  . hydrALAZINE  50 mg Oral TID  . insulin aspart  0-15 Units Subcutaneous TID WC  . insulin aspart  0-5 Units Subcutaneous QHS  . insulin aspart  16 Units Subcutaneous TID WC  . insulin glargine  40 Units  Subcutaneous QHS  . irbesartan  75 mg Oral Daily  . isosorbide mononitrate  60 mg Oral Daily  . labetalol  300 mg Oral BID  . [START ON 12/03/2015] potassium chloride  20 mEq Oral BID  . potassium chloride  40 mEq Oral TID  . sodium chloride  3 mL Intravenous Q12H  . spironolactone  25 mg Oral Daily    Assessment/Plan:  1. Likely metastatic cancer from primary liver. Large mass on the liver, pulmonary nodule, lesion on bone. I spoke with the pathology department and they sent it out for another stain. No pathology back yet. 2.   Severe hypokalemia- patient unable to tolerate IV potassium. Oral potassium ordered 3 times a day. Replace IV magnesium also for hypomagnesemia. 3. Anasarca, acute diastolic congestive heart failure- switch IV Lasix over to oral Lasix. Continue labetalol and irbesartan 4. Accelerated hypertension- blood pressure trending better 5. Diabetes mellitus- continue Lantus 40 units subcutaneous daily and 16 units of short acting insulin prior to meals 6. morbid obesity 7. history of stroke- resume aspirin 8. Thrombocytopenia- this is chronic 9. Weakness- patient refused to go to rehabilitation and wants to go home.   Code Status:     Code Status Orders        Start     Ordered   11/29/15 1146  Full code   Continuous     11/29/15 1145     Disposition Plan: Home soon. Potassium has to be improved prior to disposition  Consultants:  Nephrology  Oncology  Endocrinology  Time spent: 25 minutes  Loletha Grayer  [facility]

## 2015-12-02 NOTE — Progress Notes (Addendum)
MEDICATION RELATED CONSULT NOTE - INITIAL   Pharmacy Consult for electrolyte management Indication: hypokalemia  Allergies  Allergen Reactions  . Penicillins Itching   Patient Measurements: Height: '5\' 5"'$  (165.1 cm) Weight: 252 lb 3.2 oz (114.397 kg) IBW/kg (Calculated) : 57  Vital Signs: Temp: 98.2 F (36.8 C) (12/15 0453) Temp Source: Oral (12/15 0453) BP: 177/59 mmHg (12/15 0453) Pulse Rate: 59 (12/15 0453) Intake/Output from previous day: 12/14 0701 - 12/15 0700 In: 240 [P.O.:240] Out: 3650 [Urine:3650] Intake/Output from this shift: Total I/O In: -  Out: 300 [Urine:300]  Labs:  Recent Labs  11/30/15 0637 12/01/15 0553 12/02/15 0607  WBC 6.5  --  6.3  HGB 8.8*  --  8.8*  HCT 29.3*  --  28.7*  PLT 113*  --  131*  CREATININE 0.92 0.94 0.99  MG 2.0  --  1.9  PHOS  --   --  4.1  ALBUMIN 3.1*  --   --   PROT 5.9*  --   --   AST 33  --   --   ALT 49  --   --   ALKPHOS 237*  --   --   BILITOT 0.8  --   --    Estimated Creatinine Clearance: 76.3 mL/min (by C-G formula based on Cr of 0.99).  Medical History: Past Medical History  Diagnosis Date  . Stroke [facility])     a. 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  . Malignant hypertension     a. Admitted 06/2015 and 11/2015;  b. 06/2015 nl aldosterone/renin activity;  c. Polypharmacy.  [name] HLD (hyperlipidemia)   . Morbid obesity ([place])   . Anasarca     a. 11/2015 Echo: EF 55-65%, triv TR/MR.  [name] Hypokalemia   . Type II diabetes mellitus ([place])     a. 11/2015 A1c 9.5.  . Metastatic cancer ([place])     a. 11/2015 CT abd/pelvis: large R hepatic lobe mass extending inf to pararenal space on R. Complete occlusion of R portal vein. L hepatic lobe lesion. Bilat LL pulm mets. Skeletal mets 2/ Fx of R iliac wing. Enhancing uterine fundal mass.   Assessment: Pharmacy consulted to monitor and replace electrolytes in this 59 year old female with metastatic cancer. Patient had a K of 2.6 on admission and has a history of  hypokalemia, even while taking spironolactone outpatient.  AM labs today: Phos 4.1 within normal limits Mg 1.9 within normal limits K is low at 2.5  Mg was replaced by MD this AM with 2g IV one time dose  Goal of Therapy:  K 3.5-5.0  Plan:  12/14: Electrolytes were within normal limits, K-Dur 20 mEq PO BID was discontinued since spironolactone was restarted.  12/15:  K low at 2.5, Will give K-Dur 40 mEq PO TID x 3 doses today and K-Cl 10 mEq IV x 5 doses. Will recheck K this evening after repletion. Have ordered K-Dur 20 mEq PO BID to resume tomorrow  Pharmacy will continue to follow. Thank you for the consult.  Darylene Price Janelie Goltz 12/02/2015,7:51 AM   Patient unable to tolerate IV KCl supplementation and only received one IV dose this morning. Will check K this evening after PO supplementation.  Darylene Price [place]ann [place] 3:02 PM

## 2015-12-02 NOTE — Progress Notes (Signed)
MEDICATION RELATED CONSULT NOTE - INITIAL   Pharmacy Consult for electrolyte management Indication: hypokalemia  Allergies  Allergen Reactions  . Penicillins Itching   Patient Measurements: Height: '5\' 5"'$  (165.1 cm) Weight: 252 lb 3.2 oz (114.397 kg) IBW/kg (Calculated) : 57  Vital Signs: Temp: 98.2 F (36.8 C) (12/15 1144) Temp Source: Oral (12/15 1144) BP: 143/70 mmHg (12/15 1144) Pulse Rate: 77 (12/15 1144) Intake/Output from previous day: 12/14 0701 - 12/15 0700 In: 240 [P.O.:240] Out: 3650 [Urine:3650] Intake/Output from this shift:    Labs:  Recent Labs  11/30/15 0637 12/01/15 0553 12/02/15 0607  WBC 6.5  --  6.3  HGB 8.8*  --  8.8*  HCT 29.3*  --  28.7*  PLT 113*  --  131*  CREATININE 0.92 0.94 0.99  MG 2.0  --  1.9  PHOS  --   --  4.1  ALBUMIN 3.1*  --   --   PROT 5.9*  --   --   AST 33  --   --   ALT 49  --   --   ALKPHOS 237*  --   --   BILITOT 0.8  --   --    Estimated Creatinine Clearance: 76.3 mL/min (by C-G formula based on Cr of 0.99).  Medical History: Past Medical History  Diagnosis Date  . Stroke [facility])     a. 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  . Malignant hypertension     a. Admitted 06/2015 and 11/2015;  b. 06/2015 nl aldosterone/renin activity;  c. Polypharmacy.  [name] HLD (hyperlipidemia)   . Morbid obesity ([place])   . Anasarca     a. 11/2015 Echo: EF 55-65%, triv TR/MR.  [name] Hypokalemia   . Type II diabetes mellitus ([place])     a. 11/2015 A1c 9.5.  . Metastatic cancer ([place])     a. 11/2015 CT abd/pelvis: large R hepatic lobe mass extending inf to pararenal space on R. Complete occlusion of R portal vein. L hepatic lobe lesion. Bilat LL pulm mets. Skeletal mets 2/ Fx of R iliac wing. Enhancing uterine fundal mass.   Assessment: Pharmacy consulted to monitor and replace electrolytes in this 59 year old female with metastatic cancer. Patient had a K of 2.6 on admission and has a history of hypokalemia, even while taking  spironolactone outpatient.  AM labs today: Phos 4.1 within normal limits Mg 1.9 within normal limits K is low at 2.5  Mg was replaced by MD this AM with 2g IV one time dose  Goal of Therapy:  K 3.5-5.0  Plan:  12/14: Electrolytes were within normal limits, K-Dur 20 mEq PO BID was discontinued since spironolactone was restarted.  12/15:  K low at 2.5, Will give K-Dur 40 mEq PO TID x 3 doses today and K-Cl 10 mEq IV x 5 doses. Will recheck K this evening after repletion. Have ordered K-Dur 20 mEq PO BID to resume tomorrow  Pharmacy will continue to follow. Thank you for the consult.  Patient unable to tolerate IV KCl supplementation and only received one IV dose this morning. Will check K this evening after PO supplementation.  K+ @ 21:00 = 3.8 Will recheck electrolytes on 12/16 with AM labs.   Herchel Hopkin D 10:03 PM

## 2015-12-02 NOTE — Progress Notes (Signed)
Inpatient Diabetes Program Recommendations  AACE/ADA: New Consensus Statement on Inpatient Glycemic Control (2015)  Target Ranges:  Prepandial:   less than 140 mg/dL      Peak postprandial:   less than 180 mg/dL (1-2 hours)      Critically ill patients:  140 - 180 mg/dL   Review of Glycemic Control  Results for BETHANEY, OSHANA (MRN [id number]) as of 12/02/2015 12:46  Ref. Range 12/01/2015 07:40 12/01/2015 11:41 12/01/2015 16:19 12/01/2015 21:23 12/02/2015 07:22  Glucose-Capillary Latest Ref Range: 65-99 mg/dL 228 (H) 173 (H) 93 166 (H) 131 (H)    Diabetes history: New diagnosis Type 2 Outpatient Diabetes medications: none Current orders for Inpatient glycemic control: Lantus insulin 40 units qhs, NovoLog 16 units tid AC + NovoLog 0-15 units tid and Novolog 0-5 units qhs  Dr. Gabriel Carina managing patient.  Blood sugars improved since MD added mealtime Novolog.   Gentry Fitz, RN, BA, MHA, CDE Diabetes Coordinator Inpatient Diabetes Program  [phone] (Team Pager) [phone] ([place]) 12/02/2015 12:52 PM

## 2015-12-02 NOTE — Progress Notes (Signed)
Patient has rested quietly tonight. One complaint of pain treated with PRN medication and no signs of discomfort or distress noted. Nursing staff will continue to monitor. Earleen Reaper, RN

## 2015-12-03 DIAGNOSIS — E118 Type 2 diabetes mellitus with unspecified complications: Secondary | ICD-10-CM

## 2015-12-03 LAB — GLUCOSE, CAPILLARY
GLUCOSE-CAPILLARY: 174 mg/dL — AB (ref 65–99)
GLUCOSE-CAPILLARY: 282 mg/dL — AB (ref 65–99)
Glucose-Capillary: 246 mg/dL — ABNORMAL HIGH (ref 65–99)

## 2015-12-03 LAB — BASIC METABOLIC PANEL
Anion gap: 8 (ref 5–15)
BUN: 24 mg/dL — ABNORMAL HIGH (ref 6–20)
CALCIUM: 9 mg/dL (ref 8.9–10.3)
CO2: 38 mmol/L — ABNORMAL HIGH (ref 22–32)
CREATININE: 0.84 mg/dL (ref 0.44–1.00)
Chloride: 95 mmol/L — ABNORMAL LOW (ref 101–111)
GFR calc non Af Amer: >60 mL/min (ref >60)
Glucose, Bld: 234 mg/dL — ABNORMAL HIGH (ref 65–99)
Potassium: 4.6 mmol/L (ref 3.5–5.1)
SODIUM: 141 mmol/L (ref 135–145)

## 2015-12-03 LAB — CORTISOL, URINE, FREE
CORTISOL (UR), FREE: 810 ug/(24.h) — AB (ref 0–50)
CORTISOL,F,UG/L,U: 790 ug/L

## 2015-12-03 LAB — MAGNESIUM: MAGNESIUM: 2.3 mg/dL (ref 1.7–2.4)

## 2015-12-03 MED ORDER — INSULIN GLARGINE 100 UNIT/ML ~~LOC~~ SOLN
46.0000 [IU] | Freq: Every day | SUBCUTANEOUS | Status: DC
  Filled 2015-12-03: qty 0.46

## 2015-12-03 MED ORDER — INSULIN GLARGINE 100 UNIT/ML ~~LOC~~ SOLN: 40.0000 [IU] | Freq: Every day | SUBCUTANEOUS | Status: DC

## 2015-12-03 MED ORDER — LABETALOL HCL 300 MG PO TABS: 300.0000 mg | ORAL_TABLET | Freq: Two times a day (BID) | ORAL | Status: DC

## 2015-12-03 MED ORDER — IRBESARTAN 75 MG PO TABS: 75.0000 mg | ORAL_TABLET | Freq: Every day | ORAL | Status: DC

## 2015-12-03 MED ORDER — ASPIRIN 81 MG PO CHEW: 81.0000 mg | CHEWABLE_TABLET | Freq: Every day | ORAL | Status: AC

## 2015-12-03 MED ORDER — CLONIDINE HCL 0.3 MG PO TABS: 0.3000 mg | ORAL_TABLET | Freq: Two times a day (BID) | ORAL | Status: AC

## 2015-12-03 MED ORDER — FUROSEMIDE 40 MG PO TABS: 40.0000 mg | ORAL_TABLET | Freq: Two times a day (BID) | ORAL | Status: DC

## 2015-12-03 MED ORDER — MAGNESIUM OXIDE 400 (241.3 MG) MG PO TABS: 400.0000 mg | ORAL_TABLET | Freq: Every day | ORAL | Status: DC

## 2015-12-03 MED ORDER — POTASSIUM CHLORIDE CRYS ER 20 MEQ PO TBCR: 40.0000 meq | EXTENDED_RELEASE_TABLET | Freq: Two times a day (BID) | ORAL | Status: DC

## 2015-12-03 MED ORDER — INSULIN ASPART 100 UNIT/ML ~~LOC~~ SOLN
16.0000 [IU] | Freq: Three times a day (TID) | SUBCUTANEOUS | Status: DC
Start: 2015-12-03 — End: 2015-12-03

## 2015-12-03 MED ORDER — POTASSIUM CHLORIDE CRYS ER 20 MEQ PO TBCR
40.0000 meq | EXTENDED_RELEASE_TABLET | Freq: Two times a day (BID) | ORAL | Status: DC
  Administered 2015-12-03: 40 meq via ORAL
  Filled 2015-12-03: qty 2

## 2015-12-03 MED ORDER — OXYCODONE-ACETAMINOPHEN 5-325 MG PO TABS: 1.0000 | ORAL_TABLET | ORAL | Status: DC | PRN

## 2015-12-03 MED ORDER — "INSULIN SYRINGE 28G X 1/2"" 0.5 ML MISC": 1.0000 [IU] | Freq: Four times a day (QID) | Status: DC

## 2015-12-03 MED ORDER — POTASSIUM CHLORIDE CRYS ER 20 MEQ PO TBCR: 40.0000 meq | EXTENDED_RELEASE_TABLET | Freq: Two times a day (BID) | ORAL | Status: AC

## 2015-12-03 MED ORDER — MAGNESIUM OXIDE 400 (241.3 MG) MG PO TABS: 400.0000 mg | ORAL_TABLET | Freq: Every day | ORAL | Status: AC

## 2015-12-03 MED ORDER — HYDRALAZINE HCL 50 MG PO TABS: 50.0000 mg | ORAL_TABLET | Freq: Three times a day (TID) | ORAL | Status: DC

## 2015-12-03 MED ORDER — LABETALOL HCL 300 MG PO TABS: 300.0000 mg | ORAL_TABLET | Freq: Two times a day (BID) | ORAL | Status: AC

## 2015-12-03 MED ORDER — BLOOD GLUCOSE MONITOR KIT
PACK | Status: DC
Start: 2015-12-03 — End: 2015-12-15

## 2015-12-03 MED ORDER — INSULIN ASPART 100 UNIT/ML ~~LOC~~ SOLN: 16.0000 [IU] | Freq: Three times a day (TID) | SUBCUTANEOUS | Status: AC

## 2015-12-03 MED ORDER — SPIRONOLACTONE 25 MG PO TABS: 25.0000 mg | ORAL_TABLET | Freq: Every day | ORAL | Status: DC

## 2015-12-03 NOTE — Progress Notes (Signed)
Inpatient Diabetes Program Recommendations  AACE/ADA: New Consensus Statement on Inpatient Glycemic Control (2015)  Target Ranges:  Prepandial:   less than 140 mg/dL      Peak postprandial:   less than 180 mg/dL (1-2 hours)      Critically ill patients:  140 - 180 mg/dL   Review of Glycemic Control  Results for Renee Wells, Renee Wells (MRN [id number]) as of 12/03/2015 08:54  Ref. Range 12/01/2015 21:23 12/02/2015 07:22 12/02/2015 16:45 12/02/2015 20:59 12/03/2015 08:19  Glucose-Capillary Latest Ref Range: 65-99 mg/dL 166 (H) 131 (H) 191 (H) 174 (H) 246 (H)    Diabetes history: New diagnosis Type 2 Outpatient Diabetes medications: none Current orders for Inpatient glycemic control: Lantus insulin 40 units qhs, NovoLog 16 units tid AC + NovoLog 0-15 units tid and Novolog 0-5 units qhs   Met with the patient and her daughter to review insulin pen administration- patient has previously had instruction and was able to discuss. Reviewed the insulin pen teaching handout and referenced the Living well with Diabetes book- encouraged the patient and her daughter to review. Discussed site rotation, insulin storage, insulin properties, needle use. Addressed all questions.   Gentry Fitz, RN, BA, MHA, CDE Diabetes Coordinator Inpatient Diabetes Program  [phone] (Team Pager) [phone] ([place]) 12/03/2015 11:29 AM

## 2015-12-03 NOTE — Progress Notes (Signed)
Brief Nutrition Follow-up:  Noted pt with orders to discharge today; followed-up with pt regarding diabetic diet education. Pt reports she has reviewed the written material and has no questions regarding the diet at this time. Briefly reviewed basics of the carb controlled diabetic diet and again pt reported she had no questions at this time  [place], [place], [place] [phone] Pager

## 2015-12-03 NOTE — Progress Notes (Signed)
Patient and son at bedside. Discussed how to read insulin needles and properly administer needles. The son was able to demonstrate and administer the patient her scheduled dose of insulin.

## 2015-12-03 NOTE — Discharge Instructions (Signed)
Heart Failure Clinic appointment on December 29, 2015 at 9:00am with Darylene Price, [place]. Please call [phone] to reschedule.   Final Pathology is still pending on the liver biopsy.  Follow up with oncology as outpatient for results and plan

## 2015-12-03 NOTE — Care Management (Signed)
HOSPITAL BED  Patient has  new onset CHF, metastatic malignancy causing respiratory failure.  This requires the head of the bed to be positioned in ways not feasible with a normal bed. Head must be elevated at least 30 degrees or patient to prevent aspiration.   A wedge will not elevate the head of the bed enough to alleviate or prevent sx.

## 2015-12-03 NOTE — Progress Notes (Signed)
Discharge instructions along with home medication list and follow up gone over with patient and patients son. Both verbalized that they understood instructions. Printed rx given to patients son, also made son aware one rx was electronically submitted to pharmacy. Patient will be discharged home on 02, son with transport all equipment home. Ems will transport patient home. Ems was called for transport. Will remove iv's and telemetry when ems arrives.

## 2015-12-03 NOTE — Progress Notes (Signed)
Spoke with dr. Leslye Peer to make aware new lantus 46units at bedtime needs to be reviewed prior to printing avs. Per dr. Leslye Peer discontinue new order

## 2015-12-03 NOTE — Progress Notes (Signed)
Patient: Renee Wells / Admit Date: 11/26/2015 / Date of Encounter: 12/03/2015, 1:20 PM   Subjective: Feels well, no complaints, eating well, Somewhat weak, needing assistance in bed with bedpan, clean up No significant shortness of breath or cough, minimal leg edema  Review of Systems: Review of Systems  Respiratory: Positive for shortness of breath.   Cardiovascular: Negative.   Gastrointestinal: Negative.   Musculoskeletal: Negative.   Neurological: Positive for weakness.  Psychiatric/Behavioral: Negative.   All other systems reviewed and are negative.   Objective: Telemetry: NSR Physical Exam: Blood pressure 135/64, pulse 76, temperature 97.9 F (36.6 C), temperature source Oral, resp. rate 20, height '5\' 5"'$  (1.651 m), weight 256 lb 6.4 oz (116.302 kg), SpO2 98 %. Body mass index is 42.67 kg/(m^2). General: No chills, fever, night sweats or weight changes.  Cardiovascular: No chest pain, + dyspnea on exertion, + edema, + orthopnea, no palpitations, paroxysmal nocturnal dyspnea. Dermatological: No rash, lesions/masses Respiratory: No cough, + dyspnea Urologic: No hematuria, dysuria Abdominal: - nausea, anorexia, early satiety prior to admission. No vomiting, diarrhea, bright red blood per rectum, melena, or hematemesis Neurologic: No visual changes, +++ chronic left sided wkns - LE worse than UE since stroke in 2013. No changes in mental status. All other systems reviewed and are otherwise negative except as noted above.   Intake/Output Summary (Last 24 hours) at 12/03/15 1320 Last data filed at 12/03/15 1310  Gross per 24 hour  Intake    360 ml  Output   1300 ml  Net   -940 ml    Inpatient Medications:  . antiseptic oral rinse  7 mL Mouth Rinse BID  . aspirin  81 mg Oral Daily  . cloNIDine  0.3 mg Oral BID  . enoxaparin (LOVENOX) injection  40 mg Subcutaneous Q12H  . furosemide  40 mg Oral BID  . hydrALAZINE  50 mg Oral TID  . insulin aspart  0-15  Units Subcutaneous TID WC  . insulin aspart  0-5 Units Subcutaneous QHS  . insulin aspart  16 Units Subcutaneous TID WC  . insulin glargine  46 Units Subcutaneous QHS  . irbesartan  75 mg Oral Daily  . isosorbide mononitrate  60 mg Oral Daily  . labetalol  300 mg Oral BID  . [START ON 12/04/2015] magnesium oxide  400 mg Oral Daily  . potassium chloride  40 mEq Oral BID  . sodium chloride  3 mL Intravenous Q12H  . spironolactone  25 mg Oral Daily   Infusions:    Labs:  Recent Labs  12/02/15 0607 12/02/15 2116 12/03/15 0625  NA 140  --  141  K 2.5* 3.8 4.6  CL 93*  --  95*  CO2 39*  --  38*  GLUCOSE 151*  --  234*  BUN 23*  --  24*  CREATININE 0.99  --  0.84  CALCIUM 8.8*  --  9.0  MG 1.9  --  2.3  PHOS 4.1  --   --    No results for input(s): AST, ALT, ALKPHOS, BILITOT, PROT, ALBUMIN in the last 72 hours.  Recent Labs  12/02/15 0607  WBC 6.3  NEUTROABS 5.3  HGB 8.8*  HCT 28.7*  MCV 74.7*  PLT 131*   No results for input(s): CKTOTAL, CKMB, TROPONINI in the last 72 hours. Invalid input(s): POCBNP No results for input(s): HGBA1C in the last 72 hours.   Weights: Filed Weights   12/01/15 0640 12/02/15 0453 12/03/15 0516  Weight: 258  lb (117.028 kg) 252 lb 3.2 oz (114.397 kg) 256 lb 6.4 oz (116.302 kg)     Radiology/Studies:  Ct Pelvis W Contrast  11/27/2015  CLINICAL DATA:  Hepatic mass discovered on ultrasound. Concern for hepatic malignancy. EXAM: CT ABDOMEN WITHOUT AND WITH CONTRAST CT pelvis with contrast. TECHNIQUE: Multidetector CT imaging of the abdomen was performed following the standard protocol before and following the bolus administration of intravenous contrast. CT the pelvis performed following IV contrast. CONTRAST:  116m OMNIPAQUE IOHEXOL 350 MG/ML SOLN COMPARISON:  Ultrasound 11/27/2015, radiograph 11/26/2015 FINDINGS: Lower chest: Multiple round pulmonary nodules at the lung bases of varying size consistent with lung metastasis. Example lesion in  the RIGHT lower lobe measures 10 mm image 10, series 3. Example lesion LEFT lower lobe measures 11 mm on image 4, series 3. Hepatobiliary: Large RIGHT hepatic lobe mass occupying the near entirety of the posterior RIGHT hepatic lobe (segments 5, 6 and 7). Mass measures 12 cm x 12 cm in axial dimension and 17 cm in craniocaudad dimension. RIGHT hepatic lobe mass is partially exophytic and extends inferiorly and compresses the RIGHT kidney and extend into the space between the RIGHT kidney and the IVC. Several nodular extension this at this level (image 112, series 8). There is a smaller lesion in the LEFT hepatic lobe measuring 2.2 cm on image 48, series 8. The LEFT portal vein is patent. 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 Main portal vein is patent. Pancreas: Pancreas is normal. No ductal dilatation. No pancreatic inflammation. Spleen: Normal spleen Adrenals/urinary tract: The the RIGHT adrenal gland is not identified secondary to the hepatic mass. The RIGHT kidney is depressed inferiorly. The LEFT kidney adrenal gland appear normal. Stomach/Bowel: Stomach, small bowel, appendix, and cecum are normal. The colon and rectosigmoid colon are normal. Vascular/Lymphatic: No clear lymphadenopathy. Nodule lesion between the IVC in the RIGHT kidney on image 112 is felt to be extension of the hepatic tumor itself. No pelvic lymphadenopathy. Reproductive: The fundus of the uterus has enhancing mass measuring 7.8 x 9.2 by 6.7 cm. No adnexal abnormality is obvious. Other: No free fluid. Musculoskeletal: There is an aggressive lesion along the anterior margin of the RIGHT iliac bone. This lesion or erupts through the cortex and measures 4.0 by 4.6 cm on image 164, series 8. The sclerosis of the SI joints. IMPRESSION: 1. Large RIGHT hepatic lobe mass which extends inferior to the pararenal space on the RIGHT. 2. 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 LEFT hepatic lobe lesion additionally. 3. Bilateral lower lobe pulmonary  metastasis. 4. 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 5. Enhancing mass in the uterine fundus is likely a benign leiomyoma. Cannot exclude leiomyosarcoma given the extensive metastatic disease in no clear primary. 6. Differential considerations would include primary hepatocellular carcinoma versus metastatic disease from unknown primary. Recommend biopsying liver mass and FDG PET scan. Electronically Signed   By: SSuzy BouchardM.D.   On: 11/27/2015 14:21   Ct Abd Wo & W Cm  11/27/2015  CLINICAL DATA:  Hepatic mass discovered on ultrasound. Concern for hepatic malignancy. EXAM: CT ABDOMEN WITHOUT AND WITH CONTRAST CT pelvis with contrast. TECHNIQUE: Multidetector CT imaging of the abdomen was performed following the standard protocol before and following the bolus administration of intravenous contrast. CT the pelvis performed following IV contrast. CONTRAST:  1030mOMNIPAQUE IOHEXOL 350 MG/ML SOLN COMPARISON:  Ultrasound 11/27/2015, radiograph 11/26/2015 FINDINGS: Lower chest: Multiple round pulmonary nodules at the lung bases of varying size consistent with  lung metastasis. Example lesion in the RIGHT lower lobe measures 10 mm image 10, series 3. Example lesion LEFT lower lobe measures 11 mm on image 4, series 3. Hepatobiliary: Large RIGHT hepatic lobe mass occupying the near entirety of the posterior RIGHT hepatic lobe (segments 5, 6 and 7). Mass measures 12 cm x 12 cm in axial dimension and 17 cm in craniocaudad dimension. RIGHT hepatic lobe mass is partially exophytic and extends inferiorly and compresses the RIGHT kidney and extend into the space between the RIGHT kidney and the IVC. Several nodular extension this at this level (image 112, series 8). There is a smaller lesion in the LEFT hepatic lobe measuring 2.2 cm on image 48, series 8. The LEFT portal vein is patent. 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 Main portal vein is patent. Pancreas: Pancreas is normal. No ductal  dilatation. No pancreatic inflammation. Spleen: Normal spleen Adrenals/urinary tract: The the RIGHT adrenal gland is not identified secondary to the hepatic mass. The RIGHT kidney is depressed inferiorly. The LEFT kidney adrenal gland appear normal. Stomach/Bowel: Stomach, small bowel, appendix, and cecum are normal. The colon and rectosigmoid colon are normal. Vascular/Lymphatic: No clear lymphadenopathy. Nodule lesion between the IVC in the RIGHT kidney on image 112 is felt to be extension of the hepatic tumor itself. No pelvic lymphadenopathy. Reproductive: The fundus of the uterus has enhancing mass measuring 7.8 x 9.2 by 6.7 cm. No adnexal abnormality is obvious. Other: No free fluid. Musculoskeletal: There is an aggressive lesion along the anterior margin of the RIGHT iliac bone. This lesion or erupts through the cortex and measures 4.0 by 4.6 cm on image 164, series 8. The sclerosis of the SI joints. IMPRESSION: 1. Large RIGHT hepatic lobe mass which extends inferior to the pararenal space on the RIGHT. 2. 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 LEFT hepatic lobe lesion additionally. 3. Bilateral lower lobe pulmonary metastasis. 4. 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 5. Enhancing mass in the uterine fundus is likely a benign leiomyoma. Cannot exclude leiomyosarcoma given the extensive metastatic disease in no clear primary. 6. Differential considerations would include primary hepatocellular carcinoma versus metastatic disease from unknown primary. Recommend biopsying liver mass and FDG PET scan. Electronically Signed   By: Suzy Bouchard M.D.   On: 11/27/2015 14:21   Ct Biopsy  11/29/2015  CLINICAL DATA:  Hepatic mass EXAM: CT GUIDED CORE BIOPSY OF HEPATIC MASS ANESTHESIA/SEDATION: NONE PROCEDURE: The procedure risks, benefits, and alternatives were explained to the patient. Questions regarding the procedure were encouraged and answered. The patient understands and consents  to the procedure. The right anterior abdominal wall was prepped with chlorhexidinein a sterile fashion, and a sterile drape was applied covering the operative field. A sterile gown and sterile gloves were used for the procedure. Local anesthesia was provided with 1% Lidocaine. Utilizing CT fluoroscopic guidance a 17 gauge guiding needle was placed percutaneously into the anterior aspect of the right lobe of the liver. It was placed at the margin of the hypodense mass lesion posteriorly. Multiple 18 gauge core biopsies were then obtained. The guiding needle was then removed in Gel-Foam placed to aid in hemostasis. No post procedure hematoma is noted. The patient tolerated the procedure well and was returned to her room in satisfactory condition. COMPLICATIONS: None immediate IMPRESSION: Successful CT-guided liver biopsy as described. Electronically Signed   By: Inez Catalina M.D.   On: 11/29/2015 13:53   Dg Chest Portable 1 View  11/26/2015  CLINICAL DATA:  59 year old with progressively worsening shortness of breath and bilateral upper extremity and lower extremity edema over the past few days. EXAM: PORTABLE CHEST 1 VIEW COMPARISON:  06/03/2014. FINDINGS: Marked elevation of the right hemidiaphragm with scar/atelectasis at the right lung base. Cardiac silhouette mildly to moderately enlarged. Pulmonary venous hypertension without overt edema. No visible pleural effusions. IMPRESSION: 1. Cardiomegaly. Pulmonary venous hypertension without overt edema currently. 2. Elevation of the right hemidiaphragm with scar/atelectasis at the right lung base. Electronically Signed   By: Evangeline Dakin M.D.   On: 11/26/2015 17:56   Dg Hand Complete Left  11/26/2015  CLINICAL DATA:  Fall onto left hand. Left hand swelling and pain. Initial encounter. EXAM: LEFT HAND - COMPLETE 3+ VIEW COMPARISON:  None. FINDINGS: There is no evidence of fracture or dislocation. Mild degenerative spurring is seen involving the  metacarpophalangeal joint of the thumb. No other bone abnormality identified. Marked soft tissue swelling is seen involving the dorsum of the hand and the wrist. IMPRESSION: Marked hand and wrist soft tissue swelling. No evidence of fracture or dislocation. Electronically Signed   By: Earle Gell M.D.   On: 11/26/2015 20:04   Dg Hip Unilat  With Pelvis 2-3 Views Right  11/06/2015  CLINICAL DATA:  Chronic hip pain exacerbated by activity. Pain for 2 weeks. EXAM: DG HIP (WITH OR WITHOUT PELVIS) 2-3V RIGHT COMPARISON:  None. FINDINGS: The cortical margins of the bony pelvis are intact. No fracture. Pubic symphysis and sacroiliac joints are congruent. Both femoral heads are well-seated in the respective acetabula. Mild-to-moderate degenerative change of both hips with acetabular osteophytes. 6.7 cm elongated ossified density adjacent to the right anterior superior iliac spine is consistent with an avulsion injury, of indeterminate age. IMPRESSION: 1. Age-indeterminate avulsion injury from the right anterior superior iliac spine. 2. 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 Electronically Signed   By: Jeb Levering M.D.   On: 11/06/2015 02:00   US Abdomen Limited Ruq  11/27/2015  CLINICAL DATA:  Elevated liver enzymes EXAM: US ABDOMEN LIMITED - RIGHT UPPER QUADRANT COMPARISON:  None. FINDINGS: Gallbladder: The gallbladder is predominantly contracted. No gallbladder wall thickening or pericholecystic fluid. Negative sonographic Murphy's sign per Common bile duct: Diameter: Normal at 4 mm. Liver: There is a large round heterogeneous solid-appearing mass within the RIGHT hepatic lobe measuring 18 by 11 by 12. This mass bulges the liver capsule. Additional smaller lesions noted in the LEFT hepatic lobe measuring up to 3.5 cm. No biliary duct dilatation. Mild heterogeneity of the liver parenchyma. IMPRESSION: 1. Large mass within the RIGHT hepatic lobe and smaller lesions in the LEFT hepatic lobe are  concerning for PRIMARY HEPATIC CARCINOMA. Recommend CT of the abdomen and pelvis with contrast for further evaluation. 2. Contracted gallbladder without evidence of cholecystitis. These results will be called to the ordering clinician or representative by the Radiologist Assistant, and communication documented in the PACS or zVision Dashboard. Electronically Signed   By: Suzy Bouchard M.D.   On: 11/27/2015 09:35     Assessment and Plan  59 y.o. female    1. Acute Diastolic CHF/Anasarca:  Dramatically improved symptoms, now on Lasix by mouth with high-dose oral potassium and Aldactone --Secondary to body habitus, fluid status will be difficult to manage, will have to do by leg edema, symptoms. Weighing will be difficult especially as an outpatient. --She is scheduled to see Darylene Price in CHF clinic as an outpatient in January  Hard to know what role metastatic CA with liver  mass and R portal vein occlusion is playing in anasarca.  2. Metastatic Cancer:  LFT elevation u/s revealing large hepatic mass w/ subsequent CT of abd/pelvis with findings of bilat hepatic lobe masses,  occlusion of R portal vein, bilat LL pulm mets, right iliac wing lesion w/ fx, and uterine fundal mass.  S/p liver Bx on 12/12 - path pending.  Oncology following.  3. Malignant Hypertension:  Dramatically improved on her current medication regimen, This will help her diastolic CHF  4. Newly Dx Type II DM:  A1c 5.8 in July, now 9.5. -Lantus, novolog, and SSI started by endocrine.  5. Hypokalemia:  Supplementation ordered per medicine/nephrology.   6. Microcytic anemia:   Low nl serum iron, nl ferritin.  Signed, Esmond Plants, MD [facility] 12/03/2015, 1:20 PM

## 2015-12-03 NOTE — Discharge Summary (Addendum)
[facility] [place] NAME: Renee Wells    MR#:  [id number]  DATE OF BIRTH:  03/15/1956  DATE OF ADMISSION:  11/26/2015 ADMITTING PHYSICIAN: Aldean Jewett, MD  DATE OF DISCHARGE: 12/03/2015  PRIMARY CARE PHYSICIAN: Sharyne Peach, MD    ADMISSION DIAGNOSIS:  Hypokalemia [E87.6] Anasarca [R60.1] Dyspnea on exertion [R06.09] Transaminitis [R74.0] Hyperglycemia [R73.9] Acute on chronic systolic congestive heart failure (HCC) [I50.23] Morbid obesity, unspecified obesity type ([place]) [E66.01]  DISCHARGE DIAGNOSIS:  Principal Problem:   Acute diastolic CHF (congestive heart failure) (HCC) Active Problems:   Hypokalemia   Metastatic cancer (HCC)   Type II diabetes mellitus (HCC)   Anasarca   Morbid obesity (HCC)   Malignant hypertension   Dyspnea on exertion   Liver mass   Transaminitis   Acute on chronic systolic congestive heart failure ([place])   SECONDARY DIAGNOSIS:   Past Medical History  Diagnosis Date  . Stroke [facility])     a. 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  . Malignant hypertension     a. Admitted 06/2015 and 11/2015;  b. 06/2015 nl aldosterone/renin activity;  c. Polypharmacy.  [name] HLD (hyperlipidemia)   . Morbid obesity ([place])   . Anasarca     a. 11/2015 Echo: EF 55-65%, triv TR/MR.  [name] Hypokalemia   . Type II diabetes mellitus ([place])     a. 11/2015 A1c 9.5.  . Metastatic cancer ([place])     a. 11/2015 CT abd/pelvis: large R hepatic lobe mass extending inf to pararenal space on R. Complete occlusion of R portal vein. L hepatic lobe lesion. Bilat LL pulm mets. Skeletal mets 2/ Fx of R iliac wing. Enhancing uterine fundal mass.    HOSPITAL COURSE:   1. Likely metastatic cancer. Patient has large mass on the liver with other liver masses, pulmonary nodules and lesions on the bone. Final pathology is still pending at this time. I spoke with Dr. Rudean Hitt at the cancer center and he is okay with discharge  which follow-up early next week to set up treatment plan. 2. Severe hypokalemia- potassium has been replaced during the entire hospital course. 3. Anasarca, acute diastolic congestive heart failure. Oral Lasix twice a day, labetalol and irbesartan 4. Accelerated hypertension blood pressure is still very variable with mood. Continue current medications and titrate if needed 5. Diabetes mellitus type 2 continue Lantus 40 units subcutaneous daily and 16 units short acting insulin prior to meals 6. Morbid obesity weight loss needed, likely sleep apnea and will need an outpatient sleep study. 7. History of stroke resume aspirin 81 mg daily 8. Thrombocytopenia looking back it looks chronic 9. Weakness- patient now wants to go home with home health 10. Anemia of chronic disease follow-up counts as outpatient. 11.  Chronic respiratory failure-  Qualifies for home oxygen.  DISCHARGE CONDITIONS:   Fair  CONSULTS OBTAINED:  Treatment Team:  Murlean Iba, MD Roxana Hires, MD Judi Cong, MD Minna Merritts, MD  DRUG ALLERGIES:   Allergies  Allergen Reactions  . Penicillins Itching    DISCHARGE MEDICATIONS:   Current Discharge Medication List    START taking these medications   Details  aspirin 81 MG chewable tablet Chew 1 tablet (81 mg total) by mouth daily.    furosemide (LASIX) 40 MG tablet Take 1 tablet (40 mg total) by mouth 2 (two) times daily. Qty: 60 tablet, Refills: 0    insulin aspart (NOVOLOG) 100 UNIT/ML injection Inject 16 Units  into the skin 3 (three) times daily with meals. Qty: 10 mL, Refills: 1    insulin glargine (LANTUS) 100 UNIT/ML injection Inject 0.4 mLs (40 Units total) into the skin at bedtime. Qty: 10 mL, Refills: 1    irbesartan (AVAPRO) 75 MG tablet Take 1 tablet (75 mg total) by mouth daily. Qty: 30 tablet, Refills: 0    magnesium oxide (MAG-OX) 400 (241.3 MG) MG tablet Take 1 tablet (400 mg total) by mouth daily. Qty: 30 tablet, Refills: 0     potassium chloride SA (K-DUR,KLOR-CON) 20 MEQ tablet Take 2 tablets (40 mEq total) by mouth 2 (two) times daily. Qty: 60 tablet, Refills: 0      CONTINUE these medications which have CHANGED   Details  cloNIDine (CATAPRES) 0.3 MG tablet Take 1 tablet (0.3 mg total) by mouth 2 (two) times daily. Qty: 60 tablet, Refills: 0    hydrALAZINE (APRESOLINE) 50 MG tablet Take 1 tablet (50 mg total) by mouth 3 (three) times daily. Qty: 90 tablet, Refills: 0    labetalol (NORMODYNE) 300 MG tablet Take 1 tablet (300 mg total) by mouth 2 (two) times daily. Qty: 60 tablet, Refills: 0    oxyCODONE-acetaminophen (ROXICET) 5-325 MG tablet Take 1 tablet by mouth every 4 (four) hours as needed for severe pain. Qty: 30 tablet, Refills: 0    spironolactone (ALDACTONE) 25 MG tablet Take 1 tablet (25 mg total) by mouth daily. Qty: 30 tablet, Refills: 0      CONTINUE these medications which have NOT CHANGED   Details  atorvastatin (LIPITOR) 20 MG tablet Take 20 mg by mouth daily.    isosorbide mononitrate (IMDUR) 60 MG 24 hr tablet Take 60 mg by mouth daily.      STOP taking these medications     amLODipine (NORVASC) 10 MG tablet      aspirin 325 MG EC tablet      lisinopril (PRINIVIL,ZESTRIL) 40 MG tablet          DISCHARGE INSTRUCTIONS:   Follow-up with Dr. at rehabilitation in 1 day Follow-up with Dr.[facility] cancer center on Tuesday or Wednesday to go over results.  If you experience worsening of your admission symptoms, develop shortness of breath, life threatening emergency, suicidal or homicidal thoughts you must seek medical attention immediately by calling 911 or calling your MD immediately  if symptoms less severe.  You Must read complete instructions/literature along with all the possible adverse reactions/side effects for all the Medicines you take and that have been prescribed to you. Take any new Medicines after you have completely understood and accept all the possible adverse  reactions/side effects.   Please note  You were cared for by a hospitalist during your hospital stay. If you have any questions about your discharge medications or the care you received while you were in the hospital after you are discharged, you can call the unit and asked to speak with the hospitalist on call if the hospitalist that took care of you is not available. Once you are discharged, your primary care physician will handle any further medical issues. Please note that NO REFILLS for any discharge medications will be authorized once you are discharged, as it is imperative that you return to your primary care physician (or establish a relationship with a primary care physician if you do not have one) for your aftercare needs so that they can reassess your need for medications and monitor your lab values.    Today   CHIEF COMPLAINT:  Chief Complaint  Patient presents with  . Shortness of Breath    HISTORY OF PRESENT ILLNESS:  Renee Wells  is a 59 y.o. female presented with shortness of breath. She was found to be in acute on chronic CHF with anasarca.   VITAL SIGNS:  Blood pressure 135/64, pulse 76, temperature 97.9 F (36.6 C), temperature source Oral, resp. rate 20, height '5\' 5"'$  (1.651 m), weight 116.302 kg (256 lb 6.4 oz), SpO2 98 %.    PHYSICAL EXAMINATION:  GENERAL:  59 y.o.-year-old patient lying in the bed with no acute distress.  EYES: Pupils equal, round, reactive to light and accommodation. No scleral icterus. Extraocular muscles intact.  HEENT: Head atraumatic, normocephalic. Oropharynx and nasopharynx clear.  NECK:  Supple, no jugular venous distention. No thyroid enlargement, no tenderness.  LUNGS: Normal breath sounds bilaterally, no wheezing, rales,rhonchi or crepitation. No use of accessory muscles of respiration.  CARDIOVASCULAR: S1, S2 normal. 2/6 systolic murmurs, no rubs, or gallops.  ABDOMEN: Soft, non-tender, non-distended. Bowel sounds present. No  organomegaly or mass.  EXTREMITIES: 3+ edema, no cyanosis, or clubbing.  NEUROLOGIC: Cranial nerves II through XII are intact. Muscle strength 5/5 in all extremities. Sensation intact. Gait not checked.  PSYCHIATRIC: The patient is alert and oriented x 3.  SKIN: No obvious rash, lesion, or ulcer.   DATA REVIEW:   CBC  Recent Labs Lab 12/02/15 0607  WBC 6.3  HGB 8.8*  HCT 28.7*  PLT 131*    Chemistries   Recent Labs Lab 11/30/15 0637  12/03/15 0625  NA 143  < > 141  K 2.6*  < > 4.6  CL 98*  < > 95*  CO2 38*  < > 38*  GLUCOSE 263*  < > 234*  BUN 21*  < > 24*  CREATININE 0.92  < > 0.84  CALCIUM 8.8*  < > 9.0  MG 2.0  < > 2.3  AST 33  --   --   ALT 49  --   --   ALKPHOS 237*  --   --   BILITOT 0.8  --   --   < > = values in this interval not displayed.  Cardiac Enzymes  Recent Labs Lab 11/26/15 1834  TROPONINI <0.03    Microbiology Results  Results for orders placed or performed during the hospital encounter of 06/25/15  MRSA PCR Screening     Status: None   Collection Time: 06/26/15  5:31 AM  Result Value Ref Range Status   MRSA by PCR NEGATIVE NEGATIVE Final    Comment:        The GeneXpert MRSA Assay (FDA approved for NASAL specimens only), is one component of a comprehensive MRSA colonization surveillance program. It is not intended to diagnose MRSA infection nor to guide or monitor treatment for MRSA infections.     Management plans discussed with the patient, family and they are in agreement.  CODE STATUS:     Code Status Orders        Start     Ordered   11/29/15 1146  Full code   Continuous     11/29/15 1145      TOTAL TIME TAKING CARE OF THIS PATIENT: 35 minutes.    Loletha Grayer M.D on 12/03/2015 at 12:35 PM  Between 7am to 6pm - Pager - [phone]  After 6pm go to [email or web address] - password [email or web address] [place] [facility]  Office  [phone]  CC: Primary care physician; Sharyne Peach, MD

## 2015-12-03 NOTE — Care Management (Signed)
Patient is for discharge today.  She informed attending that she has now decided to go to skilled nursing.  Patient changed her mind again after speaking with the social worker [place]  Spoke with Malachy Mood from [employer] regarding referral and informed it would be at least 4 days before could be seen.  This is cause of concern for CM.  Spoke with [facility] and this agency  could see Sunday.  Spoke with [facility].  Discussed case and the likelihood patient may need hospice in the near future.  Case accepted by [place] and patient will be seen Saturday 12/04/15.  [facility] is providing the home 02 and hospital bed

## 2015-12-03 NOTE — Progress Notes (Signed)
SATURATION QUALIFICATIONS: (This note is used to comply with regulatory documentation for home oxygen)  Patient Saturations on Room Air at Rest = 87%  Patient Saturations on Room Air while Ambulating = na%  Patient Saturations on 2 Liters of oxygen at rest = 96%  Please briefly explain why patient needs home oxygen:

## 2015-12-03 NOTE — Clinical Social Work Note (Signed)
Clinical Social Work Assessment  Patient Details  Name: Renee Wells MRN: [id number] Date of Birth: August 07, 1956  Date of referral:  12/03/15               Reason for consult:  Facility Placement                Permission sought to share information with:  Family Supports Permission granted to share information::  Yes, Verbal Permission Granted  Name::        Agency::     Relationship::     Contact Information:     Housing/Transportation Living arrangements for the past 2 months:  Single Family Home Source of Information:  Patient, Adult Children Patient Interpreter Needed:  None Criminal Activity/Legal Involvement Pertinent to Current Situation/Hospitalization:  No - Comment as needed Significant Relationships:  Adult Children Lives with:  Adult Children Do you feel safe going back to the place where you live?  Yes Need for family participation in patient care:  Yes (Comment)  Care giving concerns:  None at this time   Facilities manager / plan:  Holiday representative (CSW) consult for SNF placement, PT is recommending STR to assist with getting patient back to previous level of functioning.   Patient was alert, oriented and engaged in conversation with CSW.  (Additional Information provided by son and daughter at bedside).  CSW introduced self and explained role of CSW department.    Patient currently lives with her son and daughter in-law, both work and patient is home alone during the day . CSW explained that PT is recommending rehab.  Patient has been to SNF. CSW reviewed SNF process with patient with possible LOG for SNF.  Family is not willing risk going to SNF outside of [place] and has decided to discharge home with home health support.  RN care manager working to set up all needed home health services.  Patient and family in agreement with plan. CSW signing off.  Employment status:  Disabled (Comment on whether or not currently receiving Disability) Insurance  information:  [place]icaid [place] PT Recommendations:  [place] / Referral to community resources:   (care management for possible Home Health services )  Patient/Family's Response to care:  Patient was appreciative of information provided by CSW  Patient/Family's Understanding of and Emotional Response to Diagnosis, Current Treatment, and Prognosis:  Patient understands that she will discharge home with home health services.  Emotional Assessment Appearance:  Appears stated age Attitude/Demeanor/Rapport:    Affect (typically observed):  Accepting, Adaptable, Pleasant, Calm Orientation:  Oriented to Self, Oriented to Place, Oriented to  Time, Oriented to Situation Alcohol / Substance use:    Psych involvement (Current and /or in the community):  No (Comment)  Discharge Needs  Concerns to be addressed:  Care Coordination, Discharge Planning Concerns Readmission within the last 30 days:  No Current discharge risk:  Chronically ill, Dependent with Mobility, Lack of support system Barriers to Discharge:  Barriers Resolved   Maurine Cane, LCSW 12/03/2015, 12:49 PM

## 2015-12-03 NOTE — Progress Notes (Signed)
Subjective:  Lower extremity continues to improve.  Tolerating PO lasix well.   Objective:  Vital signs in last 24 hours:  Temp:  [97.9 F (36.6 C)-98.7 F (37.1 C)] 97.9 F (36.6 C) (12/16 1132) Pulse Rate:  [69-76] 76 (12/16 1132) Resp:  [18-20] 20 (12/16 1132) BP: (135-183)/(64-90) 135/64 mmHg (12/16 1132) SpO2:  [87 %-100 %] 98 % (12/16 1132) Weight:  [116.302 kg (256 lb 6.4 oz)] 116.302 kg (256 lb 6.4 oz) (12/16 0516)  Weight change: 1.905 kg (4 lb 3.2 oz) Filed Weights   12/01/15 0640 12/02/15 0453 12/03/15 0516  Weight: 117.028 kg (258 lb) 114.397 kg (252 lb 3.2 oz) 116.302 kg (256 lb 6.4 oz)    Intake/Output:    Intake/Output Summary (Last 24 hours) at 12/03/15 1524 Last data filed at 12/03/15 1310  Gross per 24 hour  Intake    360 ml  Output   1300 ml  Net   -940 ml     Physical Exam: General:  obese lady, laying in the bed   HEENT  moist oral mucous membranes   Neck  supple no masses   Pulm/lungs  normal respiratory effort, scattered rhonchi otherwise clear   CVS/Heart  regular rate and rhythm, no rub or gallop   Abdomen:   soft, distended, nontender   Extremities:  2+ pitting edema, anasarca, edema less tight  Neurologic:  alert, oriented, speech normal   Skin:  no acute rashes           Basic Metabolic Panel:   Recent Labs Lab 11/26/15 1834  11/29/15 0556 11/30/15 0637 11/30/15 1940 12/01/15 0553 12/02/15 0607 12/02/15 2116 12/03/15 0625  NA 144  < > 142 143  --  144 140  --  141  K 2.6*  < > 4.0 2.6* 3.6 3.5 2.5* 3.8 4.6  CL 99*  < > 102 98*  --  96* 93*  --  95*  CO2 32  < > 36* 38*  --  38* 39*  --  38*  GLUCOSE 678*  < > 372* 263*  --  246* 151*  --  234*  BUN 24*  < > 21* 21*  --  25* 23*  --  24*  CREATININE 1.19*  < > 0.98 0.92  --  0.94 0.99  --  0.84  CALCIUM 9.1  < > 8.8* 8.8*  --  9.2 8.8*  --  9.0  MG 2.1  --   --  2.0  --   --  1.9  --  2.3  PHOS  --   --   --   --   --   --  4.1  --   --   < > = values in this interval  not displayed.   CBC:  Recent Labs Lab 11/26/15 1834 11/27/15 0546 11/30/15 0637 12/02/15 0607  WBC 6.9 6.6 6.5 6.3  NEUTROABS  --   --  5.7 5.3  HGB 9.6* 8.7* 8.8* 8.8*  HCT 31.5* 28.0* 29.3* 28.7*  MCV 75.6* 74.9* 75.1* 74.7*  PLT 105* 97* 113* 131*      Microbiology:  No results found for this or any previous visit (from the past 720 hour(s)).  Coagulation Studies: No results for input(s): LABPROT, INR in the last 72 hours.  Urinalysis: No results for input(s): COLORURINE, LABSPEC, PHURINE, GLUCOSEU, HGBUR, BILIRUBINUR, KETONESUR, PROTEINUR, UROBILINOGEN, NITRITE, LEUKOCYTESUR in the last 72 hours.  Invalid input(s): APPERANCEUR    Imaging: No results found.  Medications:     . antiseptic oral rinse  7 mL Mouth Rinse BID  . aspirin  81 mg Oral Daily  . cloNIDine  0.3 mg Oral BID  . enoxaparin (LOVENOX) injection  40 mg Subcutaneous Q12H  . furosemide  40 mg Oral BID  . hydrALAZINE  50 mg Oral TID  . insulin aspart  0-15 Units Subcutaneous TID WC  . insulin aspart  0-5 Units Subcutaneous QHS  . insulin aspart  16 Units Subcutaneous TID WC  . irbesartan  75 mg Oral Daily  . isosorbide mononitrate  60 mg Oral Daily  . labetalol  300 mg Oral BID  . [START ON 12/04/2015] magnesium oxide  400 mg Oral Daily  . potassium chloride  40 mEq Oral BID  . sodium chloride  3 mL Intravenous Q12H  . spironolactone  25 mg Oral Daily   acetaminophen **OR** acetaminophen, hydrALAZINE, HYDROcodone-acetaminophen, ondansetron **OR** ondansetron (ZOFRAN) IV, oxyCODONE-acetaminophen, polyethylene glycol  Assessment/ Plan:  59 y.o. female with a PMHX of long-standing hypertension, hip arthritis, stroke, with residual left-sided weakness, was admitted on 11/26/2015 with worsening generalized edema, dyspnea on exertion, found to have hypokalemia upon admission. Ultrasound of the abdomen shows large mass in right hepatic lobe, concerning for hepatic carcinoma  1. Generalized  edema  2. Hypokalemia 3. Hypernatremia 4. Glucosuria, hyperglycemia 5. Severe hypertension 6. Liver mass with metastases by ultrasound, CT of the abdomen  7. Anemia, unspecified 8. Thrombocytopenia  9. Proteinuria, non nephrotic urine pro/cr 1.3 10. New diagnosis of DM-2 (HbA1c 9.5%)  Patient presents as an interesting case. She has been having worsening of generalized edema for the last few months now. She has incidental finding of right hepatic mass concerning for hepatic carcinoma. She also has incidental findings of glucosuria, hematuria and hyperglycemia. She has thrombocytopenia and anemia which go along liver disease. She has severe hypokalemia. Potassium is being replaced aggressively at present.  Plan: ower extremity edema continues to improve.  Edema much less tight now.  Okay to continue by mouth furosemide 40 mg by mouth twice a day along with potassium repletion.  Potassium up to 4.6 today. Okay to also continue spironolactone is a potassium sparing diuretic.  We will need to monitor serum bicarbonate howeveras this does appear to be rising.  Patient to be placed in rehabilitation Center potentially.  Further plan per hospitalist.   LOS: 7 Cobi Aldape 12/16/20163:24 PM

## 2015-12-07 ENCOUNTER — Encounter: Payer: Self-pay | Admitting: Internal Medicine

## 2015-12-07 ENCOUNTER — Other Ambulatory Visit: Payer: Self-pay | Admitting: Vascular Surgery

## 2015-12-07 ENCOUNTER — Inpatient Hospital Stay: Payer: Medicaid Other | Attending: Internal Medicine | Admitting: Internal Medicine

## 2015-12-07 VITALS — BP 162/91 | HR 85 | Temp 98.7°F | Ht 65.0 in | Wt 268.6 lb

## 2015-12-07 DIAGNOSIS — E119 Type 2 diabetes mellitus without complications: Secondary | ICD-10-CM

## 2015-12-07 DIAGNOSIS — E876 Hypokalemia: Secondary | ICD-10-CM

## 2015-12-07 DIAGNOSIS — R1900 Intra-abdominal and pelvic swelling, mass and lump, unspecified site: Secondary | ICD-10-CM | POA: Diagnosis not present

## 2015-12-07 DIAGNOSIS — Z794 Long term (current) use of insulin: Secondary | ICD-10-CM | POA: Diagnosis not present

## 2015-12-07 DIAGNOSIS — E669 Obesity, unspecified: Secondary | ICD-10-CM | POA: Diagnosis not present

## 2015-12-07 DIAGNOSIS — M899 Disorder of bone, unspecified: Secondary | ICD-10-CM | POA: Diagnosis not present

## 2015-12-07 DIAGNOSIS — J9811 Atelectasis: Secondary | ICD-10-CM

## 2015-12-07 DIAGNOSIS — E785 Hyperlipidemia, unspecified: Secondary | ICD-10-CM | POA: Diagnosis not present

## 2015-12-07 DIAGNOSIS — C7951 Secondary malignant neoplasm of bone: Secondary | ICD-10-CM | POA: Insufficient documentation

## 2015-12-07 DIAGNOSIS — I517 Cardiomegaly: Secondary | ICD-10-CM | POA: Diagnosis not present

## 2015-12-07 DIAGNOSIS — Z79899 Other long term (current) drug therapy: Secondary | ICD-10-CM

## 2015-12-07 DIAGNOSIS — R609 Edema, unspecified: Secondary | ICD-10-CM | POA: Diagnosis not present

## 2015-12-07 DIAGNOSIS — Z993 Dependence on wheelchair: Secondary | ICD-10-CM | POA: Diagnosis not present

## 2015-12-07 DIAGNOSIS — C7B8 Other secondary neuroendocrine tumors: Secondary | ICD-10-CM | POA: Diagnosis not present

## 2015-12-07 DIAGNOSIS — C7A1 Malignant poorly differentiated neuroendocrine tumors: Secondary | ICD-10-CM

## 2015-12-07 DIAGNOSIS — Z7982 Long term (current) use of aspirin: Secondary | ICD-10-CM

## 2015-12-07 DIAGNOSIS — R748 Abnormal levels of other serum enzymes: Secondary | ICD-10-CM

## 2015-12-07 DIAGNOSIS — M7989 Other specified soft tissue disorders: Secondary | ICD-10-CM

## 2015-12-07 DIAGNOSIS — I81 Portal vein thrombosis: Secondary | ICD-10-CM

## 2015-12-07 DIAGNOSIS — R918 Other nonspecific abnormal finding of lung field: Secondary | ICD-10-CM | POA: Diagnosis not present

## 2015-12-07 DIAGNOSIS — C799 Secondary malignant neoplasm of unspecified site: Secondary | ICD-10-CM

## 2015-12-07 DIAGNOSIS — R601 Generalized edema: Secondary | ICD-10-CM | POA: Diagnosis not present

## 2015-12-07 DIAGNOSIS — R0602 Shortness of breath: Secondary | ICD-10-CM | POA: Diagnosis not present

## 2015-12-07 DIAGNOSIS — Z8673 Personal history of transient ischemic attack (TIA), and cerebral infarction without residual deficits: Secondary | ICD-10-CM

## 2015-12-07 DIAGNOSIS — I272 Other secondary pulmonary hypertension: Secondary | ICD-10-CM | POA: Diagnosis not present

## 2015-12-07 DIAGNOSIS — R160 Hepatomegaly, not elsewhere classified: Secondary | ICD-10-CM

## 2015-12-07 DIAGNOSIS — C7A8 Other malignant neuroendocrine tumors: Secondary | ICD-10-CM

## 2015-12-07 LAB — SURGICAL PATHOLOGY

## 2015-12-07 NOTE — Progress Notes (Signed)
Patient here for follow up from recent hospitalization. Patient is in weakened condition since being in the hospital.

## 2015-12-07 NOTE — Progress Notes (Signed)
[name] @ [facility] [phone]  [phone]     Renee Wells OB: 1956-09-22  MR#: [id number]  KMM#:[id number]  Patient Care Team: Sharyne Peach, MD as PCP - General (Family Medicine)  CHIEF COMPLAINT: No chief complaint on file.    No history exists.    Oncology Flowsheet 06/27/2015 11/30/2015 12/01/2015 12/01/2015 12/02/2015 12/02/2015 12/03/2015  enoxaparin (LOVENOX) [place] 40 mg 40 mg 40 mg 40 mg 40 mg 40 mg 40 mg    HISTORY OF PRESENT ILLNESS:   Renee Wells is a 59 year old African-American lady, who is admitted due to worsening fluid retention and hypokalemia, resistant hypertension and newly found hyperglycemia. During the workup she was found to have mild elevation in total bilirubin and ALT/AST, so the ultrasound of the liver was performed, which revealed the presence of a very large mass in the right lobe, as well as smaller lesions in the left lobe. Extensive workup was performed, which included CT-guided biopsy of the liver lesion. 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 AFP was within normal range and CEA was mildly elevated.  Patient was discharged from the hospital a few days ago, and comes today to discuss the results of the workup and future plans. She feels very tired, able to walk only short distances, complains of persistent leg swelling, which, fortunately, has improved. She fell a few days ago in the hospital and hurt her left wrist, but there was no fracture.    Patient does not have history of toxic exposures, alcohol use (last time she drank beer was 35 years ago), denies hepatitis, liver cirrhosis. Her primary complaint is fluid retention, shortness of breath and hypokalemia. During this admission she was found to have extremely high levels of blood glucose, which is new to her. She denies abdominal pain, nausea, vomiting, diarrhea, constipation, change in the color of stool,  weight loss, cough, chest pain, night sweats.  REVIEW OF SYSTEMS:   ROS   PAST MEDICAL HISTORY: Past Medical History  Diagnosis Date  . Stroke [facility])     a. 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  . Malignant hypertension     a. Admitted 06/2015 and 11/2015;  b. 06/2015 nl aldosterone/renin activity;  c. Polypharmacy.  [name] HLD (hyperlipidemia)   . Morbid obesity ([place])   . Anasarca     a. 11/2015 Echo: EF 55-65%, triv TR/MR.  [name] Hypokalemia   . Type II diabetes mellitus ([place])     a. 11/2015 A1c 9.5.  . Metastatic cancer ([place])     a. 11/2015 CT abd/pelvis: large R hepatic lobe mass extending inf to pararenal space on R. Complete occlusion of R portal vein. L hepatic lobe lesion. Bilat LL pulm mets. Skeletal mets 2/ Fx of R iliac wing. Enhancing uterine fundal mass.    PAST SURGICAL HISTORY: Past Surgical History  Procedure Laterality Date  . Cesarean section      FAMILY HISTORY Family History  Problem Relation Age of Onset  . Heart disease Mother   . Hypertension Mother   . Diabetes Mother   . Diabetes Sister   . Hyperlipidemia Sister     ADVANCED DIRECTIVES:  No flowsheet data found.  HEALTH MAINTENANCE: Social History  Substance Use Topics  . Smoking status: Never Smoker   . Smokeless tobacco: Not on file  . Alcohol Use: No     Comment: occasional     Allergies  Allergen Reactions  . Penicillins Itching  Current Outpatient Prescriptions  Medication Sig Dispense Refill  . aspirin 81 MG chewable tablet Chew 1 tablet (81 mg total) by mouth daily.    [name] atorvastatin (LIPITOR) 20 MG tablet Take 20 mg by mouth daily.    . blood glucose meter kit and supplies KIT Dispense based on patient and insurance preference. Use up to four times daily as directed. (FOR ICD-9 250.00, 250.01). 1 each 0  . cloNIDine (CATAPRES) 0.3 MG tablet Take 1 tablet (0.3 mg total) by mouth 2 (two) times daily. 60 tablet 0  . furosemide (LASIX) 40 MG tablet Take 1 tablet (40 mg total)  by mouth 2 (two) times daily. 60 tablet 0  . hydrALAZINE (APRESOLINE) 50 MG tablet Take 1 tablet (50 mg total) by mouth 3 (three) times daily. 90 tablet 0  . insulin aspart (NOVOLOG) 100 UNIT/ML injection Inject 16 Units into the skin 3 (three) times daily with meals. 10 mL 1  . insulin glargine (LANTUS) 100 UNIT/ML injection Inject 0.4 mLs (40 Units total) into the skin at bedtime. 10 mL 1  . INSULIN SYRINGE .5CC/28G 28G X 1/2" 0.5 ML MISC 1 Units by Does not apply route 4 (four) times daily. 200 each 5  . irbesartan (AVAPRO) 75 MG tablet Take 1 tablet (75 mg total) by mouth daily. 30 tablet 0  . isosorbide mononitrate (IMDUR) 60 MG 24 hr tablet Take 60 mg by mouth daily.    [name] labetalol (NORMODYNE) 300 MG tablet Take 1 tablet (300 mg total) by mouth 2 (two) times daily. 60 tablet 0  . magnesium oxide (MAG-OX) 400 (241.3 MG) MG tablet Take 1 tablet (400 mg total) by mouth daily. 30 tablet 0  . oxyCODONE-acetaminophen (ROXICET) 5-325 MG tablet Take 1 tablet by mouth every 4 (four) hours as needed for severe pain. 30 tablet 0  . potassium chloride SA (K-DUR,KLOR-CON) 20 MEQ tablet Take 2 tablets (40 mEq total) by mouth 2 (two) times daily. 60 tablet 0  . spironolactone (ALDACTONE) 25 MG tablet Take 1 tablet (25 mg total) by mouth daily. 30 tablet 0   No current facility-administered medications for this visit.    OBJECTIVE:  Filed Vitals:   12/07/15 1122  BP: 162/91  Pulse: 85  Temp: 98.7 F (37.1 C)     Body mass index is 44.7 kg/(m^2).    ECOG FS:3 - Symptomatic, >50% confined to bed  Physical Exam  Constitutional: She is oriented to person, place, and time and well-developed, well-nourished, and in no distress. No distress.  Morbidly obese African-American female, in a wheelchair.  HENT:  Head: Normocephalic and atraumatic.  Right Ear: External ear normal.  Left Ear: External ear normal.  Mouth/Throat: Oropharynx is clear and moist.  Eyes: Conjunctivae are normal. Pupils are equal,  round, and reactive to light. Right eye exhibits no discharge. Left eye exhibits no discharge. No scleral icterus.  Neck: Normal range of motion. Neck supple. No JVD present. No tracheal deviation present. No thyromegaly present.  Cardiovascular: Normal rate, regular rhythm, normal heart sounds and intact distal pulses.  Exam reveals no gallop and no friction rub.   No murmur heard. Pulmonary/Chest: Effort normal and breath sounds normal. No stridor. No respiratory distress. She has no wheezes. She has no rales. She exhibits no tenderness.  Abdominal: Soft. Bowel sounds are normal. She exhibits no distension and no mass. There is no tenderness. There is no rebound and no guarding.  Genitourinary:  Postponed  Musculoskeletal: Normal range of motion. She exhibits edema (  Bilateral 2+ pitting edema lower extremities, left upper extremity dependent edema.). She exhibits no tenderness.  Lymphadenopathy:    She has no cervical adenopathy.  Neurological: She is alert and oriented to person, place, and time. She has normal reflexes. No cranial nerve deficit. She exhibits normal muscle tone. Gait normal. Coordination normal. GCS score is 15.  Skin: Skin is warm. No rash noted. She is not diaphoretic. No erythema. No pallor.  This ecchymosis over the dorsal surface of the left hand and wrist  Psychiatric: Mood, memory, affect and judgment normal.  Nursing note and vitals reviewed.  Constitutional: She is oriented to person, place, and time. She appears distressed (minimal respiratory distress, on oxygen supplementation).  Morbidly obese African-American female  HENT:  Head: Normocephalic and atraumatic.  Right Ear: External ear normal.  Left Ear: External ear normal.  Mouth/Throat: Oropharynx is clear and moist.  Eyes: Conjunctivae are normal. Pupils are equal, round, and reactive to light. Right eye exhibits no discharge. Left eye exhibits no discharge. No scleral icterus.  Neck: Normal range of  motion. Neck supple. No JVD present. No tracheal deviation present. No thyromegaly present.  Cardiovascular: Normal rate, regular rhythm, normal heart sounds and intact distal pulses. Exam reveals no gallop and no friction rub.  No murmur heard. Pulmonary/Chest: Effort normal and breath sounds normal. No stridor. No respiratory distress. She has no wheezes. She has no rales. She exhibits no mass, no tenderness, no bony tenderness and no crepitus. Right breast exhibits no inverted nipple, no mass, no nipple discharge, no skin change and no tenderness. Left breast exhibits no inverted nipple, no mass, no nipple discharge, no skin change and no tenderness. Breasts are symmetrical.  LAB RESULTS:  CBC Latest Ref Rng 12/02/2015 11/30/2015  WBC 3.6 - 11.0 K/uL 6.3 6.5  Hemoglobin 12.0 - 16.0 g/dL 8.8(L) 8.8(L)  Hematocrit 35.0 - 47.0 % 28.7(L) 29.3(L)  Platelets 150 - 440 K/uL 131(L) 113(L)    Admission on 11/26/2015, Discharged on 12/03/2015  No results displayed because visit has over 200 results.      Surgical Pathology  * THIS IS AN ADDENDUM REPORT * CASE: [id number]  PATIENT: Renee Wells  Surgical Pathology Report  *Addendum *  Reason for Addendum #1: Immunohistochemistry results   SPECIMEN SUBMITTED:  A. Liver mass, right lobe, CT biopsy   CLINICAL HISTORY:  Large liver mass unknown primary   PRE-OPERATIVE DIAGNOSIS:  Liver mass   POST-OPERATIVE DIAGNOSIS:  None provided      DIAGNOSIS:  A. LIVER, RIGHT LOBE; CT-GUIDED CORE BIOPSY:  - POORLY DIFFERENTIATED CARCINOMA WITH NEUROENDOCRINE FEATURES.   Comment:  Sections show a poorly differentiated malignant neoplasm with trabecular  growth pattern and large cells with eosinophilic or clear cytoplasm.  There is moderate nuclear pleomorphism and patchy necrosis. No  histologic glandular or squamous differentiation is present.   Immunohistochemistry (IHC) was performed for further characterization.  The  neoplastic cells show diffuse strong staining for CD56, and patchy  staining for synaptophysin, pancytokeratins (AE1/AE3/Cam5.2), and CK7.  The cells are negative for the following antigens: HepPar1,  CEA-polyclonal, glypican-3, MOC-31, chromogranin, and GATA3.   The site of origin is not identified by this IHC panel, although it is  likely metastatic to the liver. I note the enhancing uterine mass seen  on the PET/CT scan, and for this reason I have added estrogen receptor  (ER) IHC which will be reported in an addendum.   The final diagnosis was called to Dr. Leslye Peer on 12/03/15 at 2:35  PM,  and I let him know that the ER would be reported in an addendum.   IHC slides were prepared by [facility], [facility], [place], and [facility], [place], [place], and  interpreted by Dr. Dicie Beam. All controls stained appropriately.    GROSS DESCRIPTION:  A. Labeled: patient's name and medical record number  Tissue fragment(s): 5  Size: 0.8-1.7 centimeters in length and diameter 0.1 cm  Description: red-brown cores/fragments, wrapped in lens paper marked  black and submitted in a mesh bag   Entirely submitted in 1-3 cassette(s).    Final Diagnosis performed by Bryan Lemma, MD. Electronically signed  12/03/2015 3:18:30PM    The electronic signature indicates that the named Attending Pathologist  has evaluated the specimen   Technical component performed at [facility], [address] [place],  [place] [address]  Lab: [id number] Dir: Darrick Penna. Evette Doffing, MD   Professional component performed at [facility], [facility],  [place], [place], [place] [address]  Lab: [phone] Dir: Dellia Nims. Rubinas, MD    ADDENDUM:  The tumor cells are negative for estrogen receptors (ER) by IHC.   IHC slides were prepared by [facility], [facility], [place], and interpreted by Dr. Dicie Beam. All controls stained    appropriately.      Addendum #1 performed by Bryan Lemma, MD. Electronically signed  12/07/2015 12:48:05PM   Results for Renee Wells, Renee Wells (MRN [id number]) as of 12/07/2015 16:27  Ref. Range 12/03/2015 06:25  Sodium Latest Ref Range: 135-145 mmol/L 141  Potassium Latest Ref Range: 3.5-5.1 mmol/L 4.6  Chloride Latest Ref Range: 101-111 mmol/L 95 (L)  CO2 Latest Ref Range: 22-32 mmol/L 38 (H)  BUN Latest Ref Range: 6-20 mg/dL 24 (H)  Creatinine Latest Ref Range: 0.44-1.00 mg/dL 0.84  Calcium Latest Ref Range: 8.9-10.3 mg/dL 9.0  EGFR (Non-African Amer.) Latest Ref Range: >60 mL/min >60  EGFR (African American) Latest Ref Range: >60 mL/min >60  Glucose Latest Ref Range: 65-99 mg/dL 234 (H)  Anion gap Latest Ref Range: 5-15  8  Magnesium Latest Ref Range: 1.7-2.4 mg/dL 2.3  Results for Renee Wells, Renee Wells (MRN [id number]) as of 12/07/2015 16:27  Ref. Range 11/30/2015 06:37  Sodium Latest Ref Range: 135-145 mmol/L 143  Potassium Latest Ref Range: 3.5-5.1 mmol/L 2.6 (LL)  Chloride Latest Ref Range: 101-111 mmol/L 98 (L)  CO2 Latest Ref Range: 22-32 mmol/L 38 (H)  BUN Latest Ref Range: 6-20 mg/dL 21 (H)  Creatinine Latest Ref Range: 0.44-1.00 mg/dL 0.92  Calcium Latest Ref Range: 8.9-10.3 mg/dL 8.8 (L)  EGFR (Non-African Amer.) Latest Ref Range: >60 mL/min >60  EGFR (African American) Latest Ref Range: >60 mL/min >60  Glucose Latest Ref Range: 65-99 mg/dL 263 (H)  Anion gap Latest Ref Range: 5-15  7  Magnesium Latest Ref Range: 1.7-2.4 mg/dL 2.0  Alkaline Phosphatase Latest Ref Range: 38-126 U/L 237 (H)  Albumin Latest Ref Range: 3.5-5.0 g/dL 3.1 (L)  AST Latest Ref Range: 15-41 U/L 33  ALT Latest Ref Range: 14-54 U/L 49  Total Protein Latest Ref Range: 6.5-8.1 g/dL 5.9 (L)  Total Bilirubin Latest Ref Range: 0.3-1.2 mg/dL 0.8    STUDIES: Ct Pelvis W Contrast  11/27/2015  CLINICAL DATA:  Hepatic mass discovered on ultrasound. Concern for hepatic malignancy. EXAM: CT ABDOMEN WITHOUT  AND WITH CONTRAST CT pelvis with contrast. TECHNIQUE: Multidetector CT imaging of the abdomen was performed following the standard protocol before and following the bolus administration of  intravenous contrast. CT the pelvis performed following IV contrast. CONTRAST:  159m OMNIPAQUE IOHEXOL 350 MG/ML SOLN COMPARISON:  Ultrasound 11/27/2015, radiograph 11/26/2015 FINDINGS: Lower chest: Multiple round pulmonary nodules at the lung bases of varying size consistent with lung metastasis. Example lesion in the RIGHT lower lobe measures 10 mm image 10, series 3. Example lesion LEFT lower lobe measures 11 mm on image 4, series 3. Hepatobiliary: Large RIGHT hepatic lobe mass occupying the near entirety of the posterior RIGHT hepatic lobe (segments 5, 6 and 7). Mass measures 12 cm x 12 cm in axial dimension and 17 cm in craniocaudad dimension. RIGHT hepatic lobe mass is partially exophytic and extends inferiorly and compresses the RIGHT kidney and extend into the space between the RIGHT kidney and the IVC. Several nodular extension this at this level (image 112, series 8). There is a smaller lesion in the LEFT hepatic lobe measuring 2.2 cm on image 48, series 8. The LEFT portal vein is patent. 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 Main portal vein is patent. Pancreas: Pancreas is normal. No ductal dilatation. No pancreatic inflammation. Spleen: Normal spleen Adrenals/urinary tract: The the RIGHT adrenal gland is not identified secondary to the hepatic mass. The RIGHT kidney is depressed inferiorly. The LEFT kidney adrenal gland appear normal. Stomach/Bowel: Stomach, small bowel, appendix, and cecum are normal. The colon and rectosigmoid colon are normal. Vascular/Lymphatic: No clear lymphadenopathy. Nodule lesion between the IVC in the RIGHT kidney on image 112 is felt to be extension of the hepatic tumor itself. No pelvic lymphadenopathy. Reproductive: The fundus of the uterus has enhancing mass measuring 7.8 x 9.2 by 6.7  cm. No adnexal abnormality is obvious. Other: No free fluid. Musculoskeletal: There is an aggressive lesion along the anterior margin of the RIGHT iliac bone. This lesion or erupts through the cortex and measures 4.0 by 4.6 cm on image 164, series 8. The sclerosis of the SI joints. IMPRESSION: 1. Large RIGHT hepatic lobe mass which extends inferior to the pararenal space on the RIGHT. 2. 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 LEFT hepatic lobe lesion additionally. 3. Bilateral lower lobe pulmonary metastasis. 4. 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 5. Enhancing mass in the uterine fundus is likely a benign leiomyoma. Cannot exclude leiomyosarcoma given the extensive metastatic disease in no clear primary. 6. Differential considerations would include primary hepatocellular carcinoma versus metastatic disease from unknown primary. Recommend biopsying liver mass and FDG PET scan. Electronically Signed   By: SSuzy BouchardM.D.   On: 11/27/2015 14:21   Ct Abd Wo & W Cm  11/27/2015  CLINICAL DATA:  Hepatic mass discovered on ultrasound. Concern for hepatic malignancy. EXAM: CT ABDOMEN WITHOUT AND WITH CONTRAST CT pelvis with contrast. TECHNIQUE: Multidetector CT imaging of the abdomen was performed following the standard protocol before and following the bolus administration of intravenous contrast. CT the pelvis performed following IV contrast. CONTRAST:  1011mOMNIPAQUE IOHEXOL 350 MG/ML SOLN COMPARISON:  Ultrasound 11/27/2015, radiograph 11/26/2015 FINDINGS: Lower chest: Multiple round pulmonary nodules at the lung bases of varying size consistent with lung metastasis. Example lesion in the RIGHT lower lobe measures 10 mm image 10, series 3. Example lesion LEFT lower lobe measures 11 mm on image 4, series 3. Hepatobiliary: Large RIGHT hepatic lobe mass occupying the near entirety of the posterior RIGHT hepatic lobe (segments 5, 6 and 7). Mass measures 12 cm x 12 cm in axial dimension  and 17 cm in craniocaudad dimension. RIGHT hepatic lobe mass is  partially exophytic and extends inferiorly and compresses the RIGHT kidney and extend into the space between the RIGHT kidney and the IVC. Several nodular extension this at this level (image 112, series 8). There is a smaller lesion in the LEFT hepatic lobe measuring 2.2 cm on image 48, series 8. The LEFT portal vein is patent. 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 Main portal vein is patent. Pancreas: Pancreas is normal. No ductal dilatation. No pancreatic inflammation. Spleen: Normal spleen Adrenals/urinary tract: The the RIGHT adrenal gland is not identified secondary to the hepatic mass. The RIGHT kidney is depressed inferiorly. The LEFT kidney adrenal gland appear normal. Stomach/Bowel: Stomach, small bowel, appendix, and cecum are normal. The colon and rectosigmoid colon are normal. Vascular/Lymphatic: No clear lymphadenopathy. Nodule lesion between the IVC in the RIGHT kidney on image 112 is felt to be extension of the hepatic tumor itself. No pelvic lymphadenopathy. Reproductive: The fundus of the uterus has enhancing mass measuring 7.8 x 9.2 by 6.7 cm. No adnexal abnormality is obvious. Other: No free fluid. Musculoskeletal: There is an aggressive lesion along the anterior margin of the RIGHT iliac bone. This lesion or erupts through the cortex and measures 4.0 by 4.6 cm on image 164, series 8. The sclerosis of the SI joints. IMPRESSION: 1. Large RIGHT hepatic lobe mass which extends inferior to the pararenal space on the RIGHT. 2. 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 LEFT hepatic lobe lesion additionally. 3. Bilateral lower lobe pulmonary metastasis. 4. 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 5. Enhancing mass in the uterine fundus is likely a benign leiomyoma. Cannot exclude leiomyosarcoma given the extensive metastatic disease in no clear primary. 6. Differential considerations would include primary hepatocellular  carcinoma versus metastatic disease from unknown primary. Recommend biopsying liver mass and FDG PET scan. Electronically Signed   By: Suzy Bouchard M.D.   On: 11/27/2015 14:21   Ct Biopsy  11/29/2015  CLINICAL DATA:  Hepatic mass EXAM: CT GUIDED CORE BIOPSY OF HEPATIC MASS ANESTHESIA/SEDATION: NONE PROCEDURE: The procedure risks, benefits, and alternatives were explained to the patient. Questions regarding the procedure were encouraged and answered. The patient understands and consents to the procedure. The right anterior abdominal wall was prepped with chlorhexidinein a sterile fashion, and a sterile drape was applied covering the operative field. A sterile gown and sterile gloves were used for the procedure. Local anesthesia was provided with 1% Lidocaine. Utilizing CT fluoroscopic guidance a 17 gauge guiding needle was placed percutaneously into the anterior aspect of the right lobe of the liver. It was placed at the margin of the hypodense mass lesion posteriorly. Multiple 18 gauge core biopsies were then obtained. The guiding needle was then removed in Gel-Foam placed to aid in hemostasis. No post procedure hematoma is noted. The patient tolerated the procedure well and was returned to her room in satisfactory condition. COMPLICATIONS: None immediate IMPRESSION: Successful CT-guided liver biopsy as described. Electronically Signed   By: Inez Catalina M.D.   On: 11/29/2015 13:53   Dg Chest Portable 1 View  11/26/2015  CLINICAL DATA:  59 year old with progressively worsening shortness of breath and bilateral upper extremity and lower extremity edema over the past few days. EXAM: PORTABLE CHEST 1 VIEW COMPARISON:  06/03/2014. FINDINGS: Marked elevation of the right hemidiaphragm with scar/atelectasis at the right lung base. Cardiac silhouette mildly to moderately enlarged. Pulmonary venous hypertension without overt edema. No visible pleural effusions. IMPRESSION: 1. Cardiomegaly. Pulmonary venous  hypertension without overt edema currently. 2. Elevation  of the right hemidiaphragm with scar/atelectasis at the right lung base. Electronically Signed   By: Evangeline Dakin M.D.   On: 11/26/2015 17:56   Dg Hand Complete Left  11/26/2015  CLINICAL DATA:  Fall onto left hand. Left hand swelling and pain. Initial encounter. EXAM: LEFT HAND - COMPLETE 3+ VIEW COMPARISON:  None. FINDINGS: There is no evidence of fracture or dislocation. Mild degenerative spurring is seen involving the metacarpophalangeal joint of the thumb. No other bone abnormality identified. Marked soft tissue swelling is seen involving the dorsum of the hand and the wrist. IMPRESSION: Marked hand and wrist soft tissue swelling. No evidence of fracture or dislocation. Electronically Signed   By: Earle Gell M.D.   On: 11/26/2015 20:04   US Abdomen Limited Ruq  11/27/2015  CLINICAL DATA:  Elevated liver enzymes EXAM: US ABDOMEN LIMITED - RIGHT UPPER QUADRANT COMPARISON:  None. FINDINGS: Gallbladder: The gallbladder is predominantly contracted. No gallbladder wall thickening or pericholecystic fluid. Negative sonographic Murphy's sign per Common bile duct: Diameter: Normal at 4 mm. Liver: There is a large round heterogeneous solid-appearing mass within the RIGHT hepatic lobe measuring 18 by 11 by 12. This mass bulges the liver capsule. Additional smaller lesions noted in the LEFT hepatic lobe measuring up to 3.5 cm. No biliary duct dilatation. Mild heterogeneity of the liver parenchyma. IMPRESSION: 1. 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 Recommend CT of the abdomen and pelvis with contrast for further evaluation. 2. Contracted gallbladder without evidence of cholecystitis. These results will be called to the ordering clinician or representative by the Radiologist Assistant, and communication documented in the PACS or zVision Dashboard. Electronically  Signed   By: Suzy Bouchard M.D.   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 Pheasant and her son, and explained to them that unfortunately, it appears that we're dealing with a very aggressive form of malignancy, which behaves similar to small cell lung cancer. What complicates this situation is the fact that unfortunately Mrs. Gritz has a very extensive burden of the disease with a large liver based mass as well as several other lesions which appeared to be suspicious for metastatic lesion. We discussed the fact that with a likelihood this disease is incurable, but likely to respond to a combination chemotherapy. Taking into account fairly poor performance status and an advanced stage of the disease, the combination of cisplatin and etoposide is likely going to be too toxic, as such, combination of carboplatin given at AUC of 5 and etoposide appears to be the most appropriate. Unfortunately, we should not expect long-term responses to this chemotherapy, and expected survival will likely be limited to few months. We discussed the potential side effects of this chemotherapy, which can include bone marrow suppression, renal failure, mucositis. We also discussed the fact that Mrs. Fullilove can seek second opinion at any other academic institutions in the area, but this needs to be done quickly, since chemotherapy should be started promptly. In the meantime, we will arrange Port-A-Cath placement, chemotherapy education session, as well as baseline CT PET, with plan to start treatment next Wednesday.  Taking into account the presence of right iliac crest metastatic lesion, she will need treatment with Xgeva.   Diagnosis diabetes-I will discuss this case with Dr. Gabriel Carina, to decide on whether additional evaluation is  required for elevated cortisol levels. Patients with  neuroendocrine tumors can present with paraneoplastic syndromes, including Cushing's syndrome.  Patient expressed understanding and was in agreement with this plan. She also understands that She can call clinic at any time with any questions, concerns, or complaints.  She will return to our clinic next week to initiate chemotherapy.  No matching staging information was found for the patient.  Roxana Hires, MD   12/07/2015 11:15 AM

## 2015-12-09 ENCOUNTER — Telehealth (HOSPITAL_COMMUNITY): Payer: Self-pay | Admitting: Internal Medicine

## 2015-12-09 ENCOUNTER — Telehealth: Payer: Self-pay | Admitting: *Deleted

## 2015-12-09 MED ORDER — MIDAZOLAM HCL 2 MG/2ML IJ SOLN
INTRAMUSCULAR | Status: AC
  Filled 2015-12-09: qty 2

## 2015-12-09 MED ORDER — FENTANYL CITRATE (PF) 100 MCG/2ML IJ SOLN
INTRAMUSCULAR | Status: AC
  Filled 2015-12-09: qty 2

## 2015-12-09 MED ORDER — SODIUM CHLORIDE 0.9 % IV SOLN
INTRAVENOUS | Status: DC
  Administered 2015-12-10: 15:00:00 via INTRAVENOUS

## 2015-12-09 MED ORDER — HYDROMORPHONE HCL 1 MG/ML IJ SOLN: 1.0000 mg | Freq: Once | INTRAMUSCULAR | Status: DC

## 2015-12-09 MED ORDER — ONDANSETRON HCL 4 MG/2ML IJ SOLN: 4.0000 mg | Freq: Four times a day (QID) | INTRAMUSCULAR | Status: DC | PRN

## 2015-12-09 MED ORDER — CLINDAMYCIN PHOSPHATE 300 MG/50ML IV SOLN: 300.0000 mg | Freq: Once | INTRAVENOUS | Status: DC

## 2015-12-09 NOTE — Telephone Encounter (Signed)
Contacted patient with re-scheduled surgery appointment. 12/10/2015 at 12:30 PM instructed patient to have nothing to eat or drink after midnight. Patient is also scheduled for PET scan and was able to verbalize her pre procedure instructions.

## 2015-12-09 NOTE — Telephone Encounter (Signed)
Contacted patient to verify that she had missed her surgery appointment for port a cath placement. Patient states that the appointment sheet she was given at registration has the 23rd as her appointment date. Will work with scheduling to arrange new schedule date.

## 2015-12-10 ENCOUNTER — Encounter
Admission: RE | Disposition: A | Discharge status: Home / Self Care | Payer: Self-pay | Source: Ambulatory Visit | Attending: Vascular Surgery

## 2015-12-10 ENCOUNTER — Other Ambulatory Visit: Payer: Self-pay | Admitting: *Deleted

## 2015-12-10 ENCOUNTER — Ambulatory Visit: Payer: Medicaid Other

## 2015-12-10 ENCOUNTER — Ambulatory Visit
Admission: RE | Admit: 2015-12-10 | Discharge: 2015-12-10 | Disposition: A | Discharge status: Home / Self Care | Payer: Medicaid Other | Source: Ambulatory Visit | Attending: Vascular Surgery | Admitting: Vascular Surgery

## 2015-12-10 ENCOUNTER — Telehealth: Payer: Self-pay | Admitting: *Deleted

## 2015-12-10 ENCOUNTER — Encounter: Payer: Self-pay | Admitting: *Deleted

## 2015-12-10 DIAGNOSIS — Z7982 Long term (current) use of aspirin: Secondary | ICD-10-CM | POA: Insufficient documentation

## 2015-12-10 DIAGNOSIS — C7A8 Other malignant neuroendocrine tumors: Secondary | ICD-10-CM | POA: Diagnosis present

## 2015-12-10 DIAGNOSIS — E785 Hyperlipidemia, unspecified: Secondary | ICD-10-CM | POA: Insufficient documentation

## 2015-12-10 DIAGNOSIS — E119 Type 2 diabetes mellitus without complications: Secondary | ICD-10-CM | POA: Diagnosis not present

## 2015-12-10 DIAGNOSIS — Z8673 Personal history of transient ischemic attack (TIA), and cerebral infarction without residual deficits: Secondary | ICD-10-CM | POA: Diagnosis not present

## 2015-12-10 DIAGNOSIS — C799 Secondary malignant neoplasm of unspecified site: Secondary | ICD-10-CM

## 2015-12-10 DIAGNOSIS — E876 Hypokalemia: Secondary | ICD-10-CM | POA: Diagnosis not present

## 2015-12-10 DIAGNOSIS — Z794 Long term (current) use of insulin: Secondary | ICD-10-CM | POA: Insufficient documentation

## 2015-12-10 DIAGNOSIS — C7800 Secondary malignant neoplasm of unspecified lung: Secondary | ICD-10-CM | POA: Diagnosis not present

## 2015-12-10 DIAGNOSIS — I10 Essential (primary) hypertension: Secondary | ICD-10-CM | POA: Insufficient documentation

## 2015-12-10 DIAGNOSIS — I509 Heart failure, unspecified: Secondary | ICD-10-CM | POA: Diagnosis not present

## 2015-12-10 HISTORY — PX: PERIPHERAL VASCULAR CATHETERIZATION: SHX172C

## 2015-12-10 SURGERY — PORTA CATH INSERTION
Anesthesia: Moderate Sedation

## 2015-12-10 MED ORDER — FENTANYL CITRATE (PF) 100 MCG/2ML IJ SOLN
INTRAMUSCULAR | Status: AC
  Filled 2015-12-10: qty 2

## 2015-12-10 MED ORDER — HEPARIN (PORCINE) IN NACL 2-0.9 UNIT/ML-% IJ SOLN
INTRAMUSCULAR | Status: AC
  Filled 2015-12-10: qty 500

## 2015-12-10 MED ORDER — MIDAZOLAM HCL 5 MG/5ML IJ SOLN
INTRAMUSCULAR | Status: AC
  Filled 2015-12-10: qty 5

## 2015-12-10 MED ORDER — GENTAMICIN SULFATE 40 MG/ML IJ SOLN
Freq: Once | INTRAMUSCULAR | Status: DC
Start: 2015-12-10 — End: 2015-12-10
  Filled 2015-12-10: qty 2

## 2015-12-10 MED ORDER — ONDANSETRON HCL 4 MG/2ML IJ SOLN: 4.0000 mg | Freq: Four times a day (QID) | INTRAMUSCULAR | Status: DC | PRN

## 2015-12-10 MED ORDER — LIDOCAINE-EPINEPHRINE (PF) 1 %-1:200000 IJ SOLN
INTRAMUSCULAR | Status: AC
  Filled 2015-12-10: qty 30

## 2015-12-10 MED ORDER — MIDAZOLAM HCL 2 MG/2ML IJ SOLN
INTRAMUSCULAR | Status: AC
  Filled 2015-12-10: qty 2

## 2015-12-10 MED ORDER — HYDRALAZINE HCL 20 MG/ML IJ SOLN
INTRAMUSCULAR | Status: AC
  Filled 2015-12-10: qty 1

## 2015-12-10 MED ORDER — HEPARIN SODIUM (PORCINE) 1000 UNIT/ML IJ SOLN
INTRAMUSCULAR | Status: AC
  Filled 2015-12-10: qty 1

## 2015-12-10 MED ORDER — CLINDAMYCIN PHOSPHATE 300 MG/50ML IV SOLN
INTRAVENOUS | Status: AC
  Filled 2015-12-10: qty 50

## 2015-12-10 MED ORDER — MIDAZOLAM HCL 2 MG/2ML IJ SOLN
INTRAMUSCULAR | Status: DC | PRN
  Administered 2015-12-10 (×2): 1 mg via INTRAVENOUS
  Administered 2015-12-10: 2 mg via INTRAVENOUS
  Administered 2015-12-10 (×2): 1 mg via INTRAVENOUS

## 2015-12-10 MED ORDER — FENTANYL CITRATE (PF) 100 MCG/2ML IJ SOLN
INTRAMUSCULAR | Status: DC | PRN
  Administered 2015-12-10 (×2): 50 ug via INTRAVENOUS
  Administered 2015-12-10 (×2): 25 ug via INTRAVENOUS
  Administered 2015-12-10: 50 ug via INTRAVENOUS

## 2015-12-10 MED ORDER — HYDROMORPHONE HCL 1 MG/ML IJ SOLN: 1.0000 mg | Freq: Once | INTRAMUSCULAR | Status: DC

## 2015-12-10 MED ORDER — HYDRALAZINE HCL 20 MG/ML IJ SOLN
INTRAMUSCULAR | Status: DC | PRN
  Administered 2015-12-10: 20 mg via INTRAVENOUS

## 2015-12-10 SURGICAL SUPPLY — 10 items
BAG DECANTER STRL (MISCELLANEOUS) ×3 IMPLANT
PACK ANGIOGRAPHY (CUSTOM PROCEDURE TRAY) ×3 IMPLANT
PAD GROUND ADULT SPLIT (MISCELLANEOUS) ×3 IMPLANT
PENCIL ELECTRO HAND CTR (MISCELLANEOUS) ×3 IMPLANT
PORTACATH POWER 8F (Port) ×3 IMPLANT
PREP CHG 10.5 TEAL (MISCELLANEOUS) ×3 IMPLANT
SUT MNCRL AB 4-0 PS2 18 (SUTURE) ×3 IMPLANT
SUT PROLENE 0 CT 1 30 (SUTURE) ×6 IMPLANT
SUTURE VIC 3-0 (SUTURE) ×9 IMPLANT
TOWEL OR 17X26 4PK STRL BLUE (TOWEL DISPOSABLE) ×3 IMPLANT

## 2015-12-10 NOTE — Telephone Encounter (Signed)
Called the pt's home and left message as well as calling pt's son and i did talk to him.  We were notified that pt's PET has been denied due to the ct scan already showed metastatic disease so therefore the pet scan is not required per insurance.  The only part of the body that did not have ct scan was chest so Dr. Hershal Coria has ordered CT chest to make sure there is no cancer in lungs and it has been scheduled for the same day and time of the PEt scan original appt.  It is 12/27 register at [facility]. 11:30 and scan 12 noon.  There is no precautions or instructions.  She can drink and eat and take her meds as usual and ignore all the instructions that went with pet scan.  Son acknowledges that new appt.. I have also completed his FMLA but there is a spot for his signature and I toook the form for his signature down to specials where pt was getting port placed.  Then it was faxed to [facility].

## 2015-12-10 NOTE — Op Note (Signed)
OPERATIVE NOTE   PROCEDURE: 1. Placement of a right IJ Infuse-a-Port  PRE-OPERATIVE DIAGNOSIS: Metastatic poorly differentiated carcinoma  POST-OPERATIVE DIAGNOSIS: Same  SURGEON: Katha Cabal M.D.  ANESTHESIA: Conscious sedation combined with 1% lidocaine with epinephrine  ESTIMATED BLOOD LOSS: Minimal   FINDING(S): 1.  Patent vein  SPECIMEN(S): None  INDICATIONS:   Renee Wells is a 59 y.o. female who presents with metastatic carcinoma.  DESCRIPTION: After obtaining full informed written consent, the patient was brought back to the special procedure suite and placed in the supine position. The patient's right neck and chest wall are prepped and draped in sterile fashion. Appropriate timeout was called.  Ultrasound is placed in a sterile sleeve, ultrasound is utilized to avoid vascular injury as well as secondary to lack of appropriate landmarks. The right internal jugular vein is identified. It is echolucent and homogeneous as well as easily compressible indicating patency. 1% lidocaine is infiltrated into the soft tissue at the base of the neck as well as on the chest wall.  Under direct ultrasound visualization Seldinger needle is inserted into the right internal jugular vein. J-wire is advanced under fluoroscopic guidance. A small counterincision was created at the wire insertion site. A transverse incision is created 2 fingerbreadths below the scapula and a pocket is fashioned using both blunt and sharp dissection. The pocket is tested for appropriate size with the hub of the Infuse-a-Port. The tunneling device is then used to pull the intravascular portion of the catheter from the pocket to the neck counterincision.  Dilator and peel-away sheath were then inserted over the wire and the wire is removed. Catheter is then advanced into the venous system without difficulty. Peel-away sheath was then removed.  Catheter is then positioned under fluoroscopic guidance at the  atrial caval junction. It is then transected connected to the hub and the hope is slipped into the subcutaneous pocket on the chest wall. The hub was then accessed percutaneously and aspirates easily and flushes well and is flushed with 30 cc of heparinized saline. The pocket incision is then closed in layers using interrupted 3-0 Vicryl for the subcutaneous tissues and 4-0 Monocryl subcuticular for skin closure. Dermabond is applied. The neck counterincision was closed with 4-0 Monocryl subcuticular and Dermabond as well.  The patient tolerated the procedure well and there were no immediate complications.  COMPLICATIONS: None  CONDITION: Unchanged  Katha Cabal M.D. [facility] Office: [phone]   12/10/2015, 1:56 PM

## 2015-12-10 NOTE — H&P (Signed)
[facility] History & Physical Update  The patient was interviewed and re-examined.  The patient's previous History and Physical has been reviewed and is unchanged.  There is no change in the plan of care. We plan to proceed with the scheduled procedure.  Chriss Redel, Dolores Lory, MD  12/10/2015, 1:54 PM

## 2015-12-10 NOTE — Progress Notes (Deleted)
OPERATIVE NOTE   PROCEDURE: 1. Placement of a right IJ Infuse-a-Port  PRE-OPERATIVE DIAGNOSIS: Metastatic poorly differentiated carcinoma  POST-OPERATIVE DIAGNOSIS: Same  SURGEON: Katha Cabal M.D.  ANESTHESIA: Conscious sedation combined with 1% lidocaine with epinephrine  ESTIMATED BLOOD LOSS: Minimal   FINDING(S): 1.  Patent vein  SPECIMEN(S): None  INDICATIONS:   Renee Wells is a 59 y.o. female who presents with metastatic carcinoma.  DESCRIPTION: After obtaining full informed written consent, the patient was brought back to the special procedure suite and placed in the supine position. The patient's right neck and chest wall are prepped and draped in sterile fashion. Appropriate timeout was called.  Ultrasound is placed in a sterile sleeve, ultrasound is utilized to avoid vascular injury as well as secondary to lack of appropriate landmarks. The right internal jugular vein is identified. It is echolucent and homogeneous as well as easily compressible indicating patency. 1% lidocaine is infiltrated into the soft tissue at the base of the neck as well as on the chest wall.  Under direct ultrasound visualization Seldinger needle is inserted into the right internal jugular vein. J-wire is advanced under fluoroscopic guidance. A small counterincision was created at the wire insertion site. A transverse incision is created 2 fingerbreadths below the scapula and a pocket is fashioned using both blunt and sharp dissection. The pocket is tested for appropriate size with the hub of the Infuse-a-Port. The tunneling device is then used to pull the intravascular portion of the catheter from the pocket to the neck counterincision.  Dilator and peel-away sheath were then inserted over the wire and the wire is removed. Catheter is then advanced into the venous system without difficulty. Peel-away sheath was then removed.  Catheter is then positioned under fluoroscopic guidance at the  atrial caval junction. It is then transected connected to the hub and the hope is slipped into the subcutaneous pocket on the chest wall. The hub was then accessed percutaneously and aspirates easily and flushes well and is flushed with 30 cc of heparinized saline. The pocket incision is then closed in layers using interrupted 3-0 Vicryl for the subcutaneous tissues and 4-0 Monocryl subcuticular for skin closure. Dermabond is applied. The neck counterincision was closed with 4-0 Monocryl subcuticular and Dermabond as well.  The patient tolerated the procedure well and there were no immediate complications.  COMPLICATIONS: None  CONDITION: Unchanged  Katha Cabal M.D. [facility] Office: [phone]   12/10/2015, 1:56 PM

## 2015-12-10 NOTE — Patient Instructions (Signed)
Etoposide, VP-16 injection  What is this medicine?  ETOPOSIDE, VP-16 (e toe POE side) is a chemotherapy drug. It is used to treat testicular cancer, lung cancer, and other cancers.  This medicine may be used for other purposes; ask your health care provider or pharmacist if you have questions.  What should I tell my health care provider before I take this medicine?  They need to know if you have any of these conditions:  -infection  -kidney disease  -low blood counts, like low white cell, platelet, or red cell counts  -an unusual or allergic reaction to etoposide, other chemotherapeutic agents, other medicines, foods, dyes, or preservatives  -pregnant or trying to get pregnant  -breast-feeding  How should I use this medicine?  This medicine is for infusion into a vein. It is administered in a hospital or clinic by a specially trained health care professional.  Talk to your pediatrician regarding the use of this medicine in children. Special care may be needed.  Overdosage: If you think you have taken too much of this medicine contact a poison control center or emergency room at once.  NOTE: This medicine is only for you. Do not share this medicine with others.  What if I miss a dose?  It is important not to miss your dose. Call your doctor or health care professional if you are unable to keep an appointment.  What may interact with this medicine?  -aspirin  -certain medications for seizures like carbamazepine, phenobarbital, phenytoin, valproic acid  -cyclosporine  -levamisole  -warfarin  This list may not describe all possible interactions. Give your health care provider a list of all the medicines, herbs, non-prescription drugs, or dietary supplements you use. Also tell them if you smoke, drink alcohol, or use illegal drugs. Some items may interact with your medicine.  What should I watch for while using this medicine?  Visit your doctor for checks on your progress. This drug may make you feel generally unwell.  This is not uncommon, as chemotherapy can affect healthy cells as well as cancer cells. Report any side effects. Continue your course of treatment even though you feel ill unless your doctor tells you to stop.  In some cases, you may be given additional medicines to help with side effects. Follow all directions for their use.  Call your doctor or health care professional for advice if you get a fever, chills or sore throat, or other symptoms of a cold or flu. Do not treat yourself. This drug decreases your body's ability to fight infections. Try to avoid being around people who are sick.  This medicine may increase your risk to bruise or bleed. Call your doctor or health care professional if you notice any unusual bleeding.  Be careful brushing and flossing your teeth or using a toothpick because you may get an infection or bleed more easily. If you have any dental work done, tell your dentist you are receiving this medicine.  Avoid taking products that contain aspirin, acetaminophen, ibuprofen, naproxen, or ketoprofen unless instructed by your doctor. These medicines may hide a fever.  Do not become pregnant while taking this medicine or for at least 6 months after stopping it. Women should inform their doctor if they wish to become pregnant or think they might be pregnant. Women of child-bearing potential will need to have a negative pregnancy test before starting this medicine. There is a potential for serious side effects to an unborn child. Talk to your health care professional or   pharmacist for more information. Do not breast-feed an infant while taking this medicine.  Men must use a latex condom during sexual contact with a woman while taking this medicine and for at least 4 months after stopping it. A latex condom is needed even if you have had a vasectomy. Contact your doctor right away if your partner becomes pregnant. Do not donate sperm while taking this medicine and for at least 4 months after you stop  taking this medicine. Men should inform their doctors if they wish to father a child. This medicine may lower sperm counts.  What side effects may I notice from receiving this medicine?  Side effects that you should report to your doctor or health care professional as soon as possible:  -allergic reactions like skin rash, itching or hives, swelling of the face, lips, or tongue  -low blood counts - this medicine may decrease the number of white blood cells, red blood cells and platelets. You may be at increased risk for infections and bleeding.  -signs of infection - fever or chills, cough, sore throat, pain or difficulty passing urine  -signs of decreased platelets or bleeding - bruising, pinpoint red spots on the skin, black, tarry stools, blood in the urine  -signs of decreased red blood cells - unusually weak or tired, fainting spells, lightheadedness  -breathing problems  -changes in vision  -mouth or throat sores or ulcers  -pain, redness, swelling or irritation at the injection site  -pain, tingling, numbness in the hands or feet  -redness, blistering, peeling or loosening of the skin, including inside the mouth  -seizures  -vomiting  Side effects that usually do not require medical attention (report to your doctor or health care professional if they continue or are bothersome):  -diarrhea  -hair loss  -loss of appetite  -nausea  -stomach pain  This list may not describe all possible side effects. Call your doctor for medical advice about side effects. You may report side effects to FDA at 1-800-FDA-1088.  Where should I keep my medicine?  This drug is given in a hospital or clinic and will not be stored at home.  NOTE: This sheet is a summary. It may not cover all possible information. If you have questions about this medicine, talk to your doctor, pharmacist, or health care provider.      2016, Elsevier/Gold Standard. (2014-07-30 12:32:50)  Carboplatin injection  What is this medicine?  CARBOPLATIN (KAR boe  pla tin) is a chemotherapy drug. It targets fast dividing cells, like cancer cells, and causes these cells to die. This medicine is used to treat ovarian cancer and many other cancers.  This medicine may be used for other purposes; ask your health care provider or pharmacist if you have questions.  What should I tell my health care provider before I take this medicine?  They need to know if you have any of these conditions:  -blood disorders  -hearing problems  -kidney disease  -recent or ongoing radiation therapy  -an unusual or allergic reaction to carboplatin, cisplatin, other chemotherapy, other medicines, foods, dyes, or preservatives  -pregnant or trying to get pregnant  -breast-feeding  How should I use this medicine?  This drug is usually given as an infusion into a vein. It is administered in a hospital or clinic by a specially trained health care professional.  Talk to your pediatrician regarding the use of this medicine in children. Special care may be needed.  Overdosage: If you think you have taken   too much of this medicine contact a poison control center or emergency room at once.  NOTE: This medicine is only for you. Do not share this medicine with others.  What if I miss a dose?  It is important not to miss a dose. Call your doctor or health care professional if you are unable to keep an appointment.  What may interact with this medicine?  -medicines for seizures  -medicines to increase blood counts like filgrastim, pegfilgrastim, sargramostim  -some antibiotics like amikacin, gentamicin, neomycin, streptomycin, tobramycin  -vaccines  Talk to your doctor or health care professional before taking any of these medicines:  -acetaminophen  -aspirin  -ibuprofen  -ketoprofen  -naproxen  This list may not describe all possible interactions. Give your health care provider a list of all the medicines, herbs, non-prescription drugs, or dietary supplements you use. Also tell them if you smoke, drink alcohol, or  use illegal drugs. Some items may interact with your medicine.  What should I watch for while using this medicine?  Your condition will be monitored carefully while you are receiving this medicine. You will need important blood work done while you are taking this medicine.  This drug may make you feel generally unwell. This is not uncommon, as chemotherapy can affect healthy cells as well as cancer cells. Report any side effects. Continue your course of treatment even though you feel ill unless your doctor tells you to stop.  In some cases, you may be given additional medicines to help with side effects. Follow all directions for their use.  Call your doctor or health care professional for advice if you get a fever, chills or sore throat, or other symptoms of a cold or flu. Do not treat yourself. This drug decreases your body's ability to fight infections. Try to avoid being around people who are sick.  This medicine may increase your risk to bruise or bleed. Call your doctor or health care professional if you notice any unusual bleeding.  Be careful brushing and flossing your teeth or using a toothpick because you may get an infection or bleed more easily. If you have any dental work done, tell your dentist you are receiving this medicine.  Avoid taking products that contain aspirin, acetaminophen, ibuprofen, naproxen, or ketoprofen unless instructed by your doctor. These medicines may hide a fever.  Do not become pregnant while taking this medicine. Women should inform their doctor if they wish to become pregnant or think they might be pregnant. There is a potential for serious side effects to an unborn child. Talk to your health care professional or pharmacist for more information. Do not breast-feed an infant while taking this medicine.  What side effects may I notice from receiving this medicine?  Side effects that you should report to your doctor or health care professional as soon as possible:  -allergic  reactions like skin rash, itching or hives, swelling of the face, lips, or tongue  -signs of infection - fever or chills, cough, sore throat, pain or difficulty passing urine  -signs of decreased platelets or bleeding - bruising, pinpoint red spots on the skin, black, tarry stools, nosebleeds  -signs of decreased red blood cells - unusually weak or tired, fainting spells, lightheadedness  -breathing problems  -changes in hearing  -changes in vision  -chest pain  -high blood pressure  -low blood counts - This drug may decrease the number of white blood cells, red blood cells and platelets. You may be at increased risk for   infections and bleeding.  -nausea and vomiting  -pain, swelling, redness or irritation at the injection site  -pain, tingling, numbness in the hands or feet  -problems with balance, talking, walking  -trouble passing urine or change in the amount of urine  Side effects that usually do not require medical attention (report to your doctor or health care professional if they continue or are bothersome):  -hair loss  -loss of appetite  -metallic taste in the mouth or changes in taste  This list may not describe all possible side effects. Call your doctor for medical advice about side effects. You may report side effects to FDA at 1-800-FDA-1088.  Where should I keep my medicine?  This drug is given in a hospital or clinic and will not be stored at home.  NOTE: This sheet is a summary. It may not cover all possible information. If you have questions about this medicine, talk to your doctor, pharmacist, or health care provider.      2016, Elsevier/Gold Standard. (2008-03-10 14:38:05)

## 2015-12-10 NOTE — Discharge Instructions (Signed)
, Care After °Refer to this sheet in the next few weeks. These instructions provide you with information on caring for yourself after your procedure. Your caregiver may also give you more specific instructions. Your treatment has been planned according to current medical practices, but problems sometimes occur. Call your caregiver if you have any problems or questions after your procedure.  °HOME CARE INSTRUCTIONS °· Rest at home the day of the procedure. You will likely be able to return to normal activities the following day. °· Follow your caregiver's specific instructions for the type of device that you have. °· Only take over-the-counter or prescription medicines as directed by your caregiver. °· Keep the insertion site of the catheter clean and dry at all times. °¨ Change the bandages (dressings) over the catheter site as directed by your caregiver. °¨ Wash the area around the catheter site during each dressing change. Sponge bathe the area using a germ-killing (antiseptic) solution as directed by your caregiver. °¨ Look for redness or swelling at the insertion site during each dressing change. °· Apply an antibiotic ointment as directed by your caregiver. °· Flush your catheter as directed to keep it from becoming clogged. °· Always wash your hands thoroughly before changing dressings or flushing the catheter. °· Do not let air enter the catheter. °¨ Never open the cap at the catheter tip. °¨ Always make sure there is no air in the syringe or in the tubing for infusions.    °· Do not lift anything heavy. °· Do not drive until your caregiver approves. °· Do not shower or bathe until your caregiver approves. When you shower or bathe, place a piece of plastic wrap over the catheter site. Do not allow the catheter site or the dressing to get wet. If taking a bath, do not allow the catheter to get submerged in the water. °If the catheter was inserted through an arm vein:  °· Avoid wearing tight clothes or jewelry  on the arm that has the catheter.   °· Do not sleep with your head on the arm that has the catheter.   °· Do not allow use of a blood pressure cuff on the arm that has the catheter.   °· Do not let anyone draw blood from the arm that has the catheter, except through the catheter itself. °SEEK MEDICAL CARE IF: °· You have bleeding at the insertion site of the catheter.   °· You feel weak or nauseous.   °· Your catheter is not working properly.   °· You have redness, pain, swelling, and warmth at the insertion site.   °· You notice fluid draining from the insertion site.   °SEEK IMMEDIATE MEDICAL CARE IF: °· Your catheter breaks or has a hole in it.   °· Your catheter comes loose or gets pulled completely out. If this happens, hold firm pressure over the area with your hand or a clean cloth.   °· You have a fever. °· You have chills.   °· Your catheter becomes totally blocked.   °· You have swelling in your arm, shoulder, neck, or face.   °· You have bleeding from the insertion site that does not stop.   °· You develop chest pain or have trouble breathing.   °· You feel dizzy or faint.   °MAKE SURE YOU: °· Understand these instructions. °· Will watch your condition. °· Will get help right away if you are not doing well or get worse. °  °This information is not intended to replace advice given to you by your health care provider. Make sure you discuss any questions you   have with your health care provider. °  °Document Released: 11/20/2012 Document Revised: 08/06/2013 Document Reviewed: 11/20/2012 °Elsevier Interactive Patient Education ©2016 [employer] ° °

## 2015-12-10 NOTE — H&P (Signed)
  [facility] History & Physical Update  The patient was interviewed and re-examined.  The patient's previous History and Physical has been reviewed and is unchanged.  There is no change in the plan of care. We plan to proceed with the scheduled procedure.  DEW,JASON, MD  12/10/2015, 12:43 PM

## 2015-12-14 ENCOUNTER — Telehealth: Payer: Self-pay | Admitting: *Deleted

## 2015-12-14 ENCOUNTER — Ambulatory Visit
Admission: RE | Admit: 2015-12-14 | Discharge: 2015-12-14 | Disposition: A | Discharge status: Home / Self Care | Payer: Medicaid Other | Source: Ambulatory Visit | Attending: Internal Medicine | Admitting: Internal Medicine

## 2015-12-14 ENCOUNTER — Encounter: Payer: Self-pay | Admitting: Vascular Surgery

## 2015-12-14 ENCOUNTER — Other Ambulatory Visit: Payer: Self-pay | Admitting: *Deleted

## 2015-12-14 ENCOUNTER — Inpatient Hospital Stay: Payer: Medicaid Other

## 2015-12-14 DIAGNOSIS — C799 Secondary malignant neoplasm of unspecified site: Secondary | ICD-10-CM

## 2015-12-14 DIAGNOSIS — C7800 Secondary malignant neoplasm of unspecified lung: Secondary | ICD-10-CM | POA: Insufficient documentation

## 2015-12-14 DIAGNOSIS — C787 Secondary malignant neoplasm of liver and intrahepatic bile duct: Secondary | ICD-10-CM | POA: Insufficient documentation

## 2015-12-14 DIAGNOSIS — C801 Malignant (primary) neoplasm, unspecified: Secondary | ICD-10-CM

## 2015-12-14 MED ORDER — IOHEXOL 300 MG/ML  SOLN
100.0000 mL | Freq: Once | INTRAMUSCULAR | Status: AC | PRN
  Administered 2015-12-14: 75 mL via INTRAVENOUS

## 2015-12-14 MED ORDER — LIDOCAINE-PRILOCAINE 2.5-2.5 % EX CREA: TOPICAL_CREAM | CUTANEOUS | Status: DC

## 2015-12-14 NOTE — Telephone Encounter (Signed)
Needs emla cream for port access when starts chemo tom. Electronically sent in to her pharmacy

## 2015-12-15 ENCOUNTER — Other Ambulatory Visit: Payer: Self-pay

## 2015-12-15 ENCOUNTER — Inpatient Hospital Stay: Payer: Medicaid Other

## 2015-12-15 ENCOUNTER — Encounter: Payer: Self-pay | Admitting: Medical Oncology

## 2015-12-15 ENCOUNTER — Inpatient Hospital Stay (HOSPITAL_BASED_OUTPATIENT_CLINIC_OR_DEPARTMENT_OTHER): Payer: Medicaid Other | Admitting: Internal Medicine

## 2015-12-15 ENCOUNTER — Inpatient Hospital Stay
Admission: EM | Admit: 2015-12-15 | Discharge: 2015-12-18 | DRG: 292 | Disposition: A | Discharge status: Home Health | Payer: Medicaid Other | Attending: Internal Medicine | Admitting: Internal Medicine

## 2015-12-15 ENCOUNTER — Encounter: Payer: Self-pay | Admitting: Internal Medicine

## 2015-12-15 ENCOUNTER — Emergency Department: Payer: Medicaid Other

## 2015-12-15 VITALS — BP 189/81 | HR 80 | Temp 98.0°F | Resp 18 | Wt 266.8 lb

## 2015-12-15 VITALS — BP 207/105 | HR 90

## 2015-12-15 DIAGNOSIS — C229 Malignant neoplasm of liver, not specified as primary or secondary: Secondary | ICD-10-CM | POA: Diagnosis present

## 2015-12-15 DIAGNOSIS — R748 Abnormal levels of other serum enzymes: Secondary | ICD-10-CM

## 2015-12-15 DIAGNOSIS — C7A8 Other malignant neuroendocrine tumors: Secondary | ICD-10-CM | POA: Diagnosis present

## 2015-12-15 DIAGNOSIS — Z79899 Other long term (current) drug therapy: Secondary | ICD-10-CM

## 2015-12-15 DIAGNOSIS — M7989 Other specified soft tissue disorders: Secondary | ICD-10-CM

## 2015-12-15 DIAGNOSIS — Z7982 Long term (current) use of aspirin: Secondary | ICD-10-CM

## 2015-12-15 DIAGNOSIS — E785 Hyperlipidemia, unspecified: Secondary | ICD-10-CM | POA: Diagnosis present

## 2015-12-15 DIAGNOSIS — R609 Edema, unspecified: Secondary | ICD-10-CM | POA: Diagnosis not present

## 2015-12-15 DIAGNOSIS — I272 Other secondary pulmonary hypertension: Secondary | ICD-10-CM | POA: Diagnosis not present

## 2015-12-15 DIAGNOSIS — M899 Disorder of bone, unspecified: Secondary | ICD-10-CM | POA: Diagnosis not present

## 2015-12-15 DIAGNOSIS — R160 Hepatomegaly, not elsewhere classified: Secondary | ICD-10-CM

## 2015-12-15 DIAGNOSIS — I81 Portal vein thrombosis: Secondary | ICD-10-CM

## 2015-12-15 DIAGNOSIS — I160 Hypertensive urgency: Secondary | ICD-10-CM | POA: Diagnosis present

## 2015-12-15 DIAGNOSIS — E876 Hypokalemia: Secondary | ICD-10-CM

## 2015-12-15 DIAGNOSIS — Z6841 Body Mass Index (BMI) 40.0 and over, adult: Secondary | ICD-10-CM | POA: Diagnosis not present

## 2015-12-15 DIAGNOSIS — C7951 Secondary malignant neoplasm of bone: Secondary | ICD-10-CM | POA: Diagnosis present

## 2015-12-15 DIAGNOSIS — E669 Obesity, unspecified: Secondary | ICD-10-CM

## 2015-12-15 DIAGNOSIS — D638 Anemia in other chronic diseases classified elsewhere: Secondary | ICD-10-CM | POA: Diagnosis present

## 2015-12-15 DIAGNOSIS — Z8673 Personal history of transient ischemic attack (TIA), and cerebral infarction without residual deficits: Secondary | ICD-10-CM

## 2015-12-15 DIAGNOSIS — I517 Cardiomegaly: Secondary | ICD-10-CM

## 2015-12-15 DIAGNOSIS — Z794 Long term (current) use of insulin: Secondary | ICD-10-CM

## 2015-12-15 DIAGNOSIS — C799 Secondary malignant neoplasm of unspecified site: Secondary | ICD-10-CM

## 2015-12-15 DIAGNOSIS — C7B8 Other secondary neuroendocrine tumors: Secondary | ICD-10-CM

## 2015-12-15 DIAGNOSIS — Z880 Allergy status to penicillin: Secondary | ICD-10-CM

## 2015-12-15 DIAGNOSIS — Z79891 Long term (current) use of opiate analgesic: Secondary | ICD-10-CM

## 2015-12-15 DIAGNOSIS — Z993 Dependence on wheelchair: Secondary | ICD-10-CM

## 2015-12-15 DIAGNOSIS — R1900 Intra-abdominal and pelvic swelling, mass and lump, unspecified site: Secondary | ICD-10-CM

## 2015-12-15 DIAGNOSIS — I110 Hypertensive heart disease with heart failure: Principal | ICD-10-CM | POA: Diagnosis present

## 2015-12-15 DIAGNOSIS — R0602 Shortness of breath: Secondary | ICD-10-CM

## 2015-12-15 DIAGNOSIS — E119 Type 2 diabetes mellitus without complications: Secondary | ICD-10-CM | POA: Diagnosis not present

## 2015-12-15 DIAGNOSIS — J9811 Atelectasis: Secondary | ICD-10-CM

## 2015-12-15 DIAGNOSIS — R601 Generalized edema: Secondary | ICD-10-CM

## 2015-12-15 DIAGNOSIS — I5033 Acute on chronic diastolic (congestive) heart failure: Secondary | ICD-10-CM | POA: Diagnosis not present

## 2015-12-15 DIAGNOSIS — E11649 Type 2 diabetes mellitus with hypoglycemia without coma: Secondary | ICD-10-CM | POA: Diagnosis present

## 2015-12-15 DIAGNOSIS — C7800 Secondary malignant neoplasm of unspecified lung: Secondary | ICD-10-CM | POA: Diagnosis present

## 2015-12-15 DIAGNOSIS — R918 Other nonspecific abnormal finding of lung field: Secondary | ICD-10-CM

## 2015-12-15 DIAGNOSIS — J810 Acute pulmonary edema: Secondary | ICD-10-CM

## 2015-12-15 LAB — CBC
HCT: 25.9 % — ABNORMAL LOW (ref 35.0–47.0)
Hemoglobin: 8 g/dL — ABNORMAL LOW (ref 12.0–16.0)
MCH: 23.3 pg — AB (ref 26.0–34.0)
MCHC: 30.8 g/dL — ABNORMAL LOW (ref 32.0–36.0)
MCV: 75.8 fL — AB (ref 80.0–100.0)
PLATELETS: 103 10*3/uL — AB (ref 150–440)
RBC: 3.42 MIL/uL — ABNORMAL LOW (ref 3.80–5.20)
RDW: 18.6 % — AB (ref 11.5–14.5)
WBC: 9.7 10*3/uL (ref 3.6–11.0)

## 2015-12-15 LAB — BRAIN NATRIURETIC PEPTIDE: B Natriuretic Peptide: 315 pg/mL — ABNORMAL HIGH (ref 0.0–100.0)

## 2015-12-15 LAB — BASIC METABOLIC PANEL
Anion gap: 12 (ref 5–15)
BUN: 28 mg/dL — AB (ref 6–20)
CALCIUM: 8.9 mg/dL (ref 8.9–10.3)
CHLORIDE: 98 mmol/L — AB (ref 101–111)
CO2: 34 mmol/L — ABNORMAL HIGH (ref 22–32)
CREATININE: 0.9 mg/dL (ref 0.44–1.00)
GFR calc Af Amer: >60 mL/min (ref >60)
GFR calc non Af Amer: >60 mL/min (ref >60)
Glucose, Bld: 325 mg/dL — ABNORMAL HIGH (ref 65–99)
Potassium: 2.9 mmol/L — CL (ref 3.5–5.1)
SODIUM: 144 mmol/L (ref 135–145)

## 2015-12-15 LAB — CBC WITH DIFFERENTIAL/PLATELET
BASOS ABS: 0 10*3/uL (ref 0–0.1)
BASOS PCT: 0 %
EOS ABS: 0 10*3/uL (ref 0–0.7)
Eosinophils Relative: 0 %
HEMATOCRIT: 26.5 % — AB (ref 35.0–47.0)
HEMOGLOBIN: 8.2 g/dL — AB (ref 12.0–16.0)
Lymphocytes Relative: 3 %
Lymphs Abs: 0.3 10*3/uL — ABNORMAL LOW (ref 1.0–3.6)
MCH: 23.1 pg — ABNORMAL LOW (ref 26.0–34.0)
MCHC: 31.1 g/dL — AB (ref 32.0–36.0)
MCV: 74.4 fL — ABNORMAL LOW (ref 80.0–100.0)
Monocytes Absolute: 0.7 10*3/uL (ref 0.2–0.9)
Monocytes Relative: 7 %
NEUTROS ABS: 8.7 10*3/uL — AB (ref 1.4–6.5)
NEUTROS PCT: 90 %
Platelets: 115 10*3/uL — ABNORMAL LOW (ref 150–440)
RBC: 3.56 MIL/uL — AB (ref 3.80–5.20)
RDW: 18.3 % — ABNORMAL HIGH (ref 11.5–14.5)
WBC: 9.7 10*3/uL (ref 3.6–11.0)

## 2015-12-15 LAB — COMPREHENSIVE METABOLIC PANEL
ALBUMIN: 3.2 g/dL — AB (ref 3.5–5.0)
ALK PHOS: 251 U/L — AB (ref 38–126)
ALT: 68 U/L — AB (ref 14–54)
ANION GAP: 12 (ref 5–15)
AST: 64 U/L — AB (ref 15–41)
BUN: 26 mg/dL — AB (ref 6–20)
CALCIUM: 8.9 mg/dL (ref 8.9–10.3)
CO2: 33 mmol/L — AB (ref 22–32)
Chloride: 91 mmol/L — ABNORMAL LOW (ref 101–111)
Creatinine, Ser: 0.9 mg/dL (ref 0.44–1.00)
GFR calc Af Amer: >60 mL/min (ref >60)
GFR calc non Af Amer: >60 mL/min (ref >60)
GLUCOSE: 224 mg/dL — AB (ref 65–99)
Potassium: 2.5 mmol/L — CL (ref 3.5–5.1)
SODIUM: 136 mmol/L (ref 135–145)
Total Bilirubin: 0.6 mg/dL (ref 0.3–1.2)
Total Protein: 6 g/dL — ABNORMAL LOW (ref 6.5–8.1)

## 2015-12-15 LAB — TROPONIN I: TROPONIN I: 0.04 ng/mL — AB (ref <0.031)

## 2015-12-15 LAB — MAGNESIUM: Magnesium: 1.9 mg/dL (ref 1.7–2.4)

## 2015-12-15 LAB — GLUCOSE, CAPILLARY: Glucose-Capillary: 400 mg/dL — ABNORMAL HIGH (ref 65–99)

## 2015-12-15 MED ORDER — ACETAMINOPHEN 325 MG PO TABS: 650.0000 mg | ORAL_TABLET | Freq: Four times a day (QID) | ORAL | Status: DC | PRN

## 2015-12-15 MED ORDER — ONDANSETRON HCL 4 MG PO TABS: 4.0000 mg | ORAL_TABLET | Freq: Four times a day (QID) | ORAL | Status: DC | PRN

## 2015-12-15 MED ORDER — FUROSEMIDE 10 MG/ML IJ SOLN
40.0000 mg | Freq: Two times a day (BID) | INTRAMUSCULAR | Status: DC
  Administered 2015-12-16 – 2015-12-17 (×3): 40 mg via INTRAVENOUS
  Filled 2015-12-15 (×3): qty 4

## 2015-12-15 MED ORDER — SODIUM CHLORIDE 0.9 % IV SOLN
Freq: Once | INTRAVENOUS | Status: AC
  Administered 2015-12-15: 14:00:00 via INTRAVENOUS
  Filled 2015-12-15: qty 8

## 2015-12-15 MED ORDER — POTASSIUM CHLORIDE CRYS ER 20 MEQ PO TBCR
40.0000 meq | EXTENDED_RELEASE_TABLET | Freq: Two times a day (BID) | ORAL | Status: DC
  Administered 2015-12-15 – 2015-12-18 (×6): 40 meq via ORAL
  Filled 2015-12-15 (×6): qty 2

## 2015-12-15 MED ORDER — ONDANSETRON HCL 4 MG/2ML IJ SOLN: 4.0000 mg | Freq: Four times a day (QID) | INTRAMUSCULAR | Status: DC | PRN

## 2015-12-15 MED ORDER — LISINOPRIL 20 MG PO TABS
40.0000 mg | ORAL_TABLET | Freq: Every day | ORAL | Status: DC
  Administered 2015-12-16 – 2015-12-18 (×3): 40 mg via ORAL
  Filled 2015-12-15 (×3): qty 2

## 2015-12-15 MED ORDER — ALBUTEROL SULFATE (2.5 MG/3ML) 0.083% IN NEBU: 2.5000 mg | INHALATION_SOLUTION | RESPIRATORY_TRACT | Status: DC | PRN

## 2015-12-15 MED ORDER — HYDRALAZINE HCL 20 MG/ML IJ SOLN
10.0000 mg | Freq: Once | INTRAMUSCULAR | Status: AC | PRN
Start: 2015-12-15 — End: 2015-12-15
  Administered 2015-12-15: 10 mg via INTRAVENOUS
  Filled 2015-12-15: qty 0.5

## 2015-12-15 MED ORDER — HYDRALAZINE HCL 50 MG PO TABS
50.0000 mg | ORAL_TABLET | Freq: Three times a day (TID) | ORAL | Status: DC
  Administered 2015-12-15 – 2015-12-16 (×3): 50 mg via ORAL
  Filled 2015-12-15 (×3): qty 1

## 2015-12-15 MED ORDER — SODIUM CHLORIDE 0.9 % IV SOLN
100.0000 mg/m2 | Freq: Once | INTRAVENOUS | Status: AC
  Administered 2015-12-15: 200 mg via INTRAVENOUS
  Filled 2015-12-15: qty 10

## 2015-12-15 MED ORDER — LABETALOL HCL 200 MG PO TABS
300.0000 mg | ORAL_TABLET | Freq: Two times a day (BID) | ORAL | Status: DC
  Administered 2015-12-15 – 2015-12-18 (×6): 300 mg via ORAL
  Filled 2015-12-15: qty 1
  Filled 2015-12-15 (×3): qty 2
  Filled 2015-12-15 (×2): qty 1
  Filled 2015-12-15: qty 2

## 2015-12-15 MED ORDER — FUROSEMIDE 10 MG/ML IJ SOLN
40.0000 mg | Freq: Once | INTRAMUSCULAR | Status: AC
  Administered 2015-12-15: 40 mg via INTRAVENOUS
  Filled 2015-12-15: qty 4

## 2015-12-15 MED ORDER — INSULIN ASPART 100 UNIT/ML ~~LOC~~ SOLN
16.0000 [IU] | Freq: Three times a day (TID) | SUBCUTANEOUS | Status: DC
  Administered 2015-12-16 – 2015-12-18 (×9): 16 [IU] via SUBCUTANEOUS
  Filled 2015-12-15 (×9): qty 16

## 2015-12-15 MED ORDER — SODIUM CHLORIDE 0.9 % IJ SOLN: 3.0000 mL | Freq: Two times a day (BID) | INTRAMUSCULAR | Status: DC

## 2015-12-15 MED ORDER — POTASSIUM CHLORIDE 10 MEQ/100ML IV SOLN
10.0000 meq | INTRAVENOUS | Status: AC
  Administered 2015-12-15: 10 meq via INTRAVENOUS
  Filled 2015-12-15 (×2): qty 100

## 2015-12-15 MED ORDER — SODIUM CHLORIDE 0.9 % IV SOLN
Freq: Once | INTRAVENOUS | Status: AC
  Administered 2015-12-15: 11:00:00 via INTRAVENOUS
  Filled 2015-12-15: qty 1000

## 2015-12-15 MED ORDER — ONDANSETRON HCL 8 MG PO TABS: 8.0000 mg | ORAL_TABLET | Freq: Three times a day (TID) | ORAL | Status: AC | PRN

## 2015-12-15 MED ORDER — INSULIN ASPART 100 UNIT/ML ~~LOC~~ SOLN
0.0000 [IU] | Freq: Three times a day (TID) | SUBCUTANEOUS | Status: DC
  Administered 2015-12-16: 5 [IU] via SUBCUTANEOUS
  Administered 2015-12-16: 9 [IU] via SUBCUTANEOUS
  Administered 2015-12-16: 2 [IU] via SUBCUTANEOUS
  Administered 2015-12-17: 3 [IU] via SUBCUTANEOUS
  Administered 2015-12-17 – 2015-12-18 (×2): 2 [IU] via SUBCUTANEOUS
  Administered 2015-12-18 (×2): 3 [IU] via SUBCUTANEOUS
  Filled 2015-12-15: qty 2
  Filled 2015-12-15: qty 3
  Filled 2015-12-15: qty 2
  Filled 2015-12-15: qty 6
  Filled 2015-12-15: qty 3
  Filled 2015-12-15: qty 2

## 2015-12-15 MED ORDER — MAGNESIUM SULFATE 2 GM/50ML IV SOLN
2.0000 g | Freq: Once | INTRAVENOUS | Status: AC
  Administered 2015-12-15: 2 g via INTRAVENOUS
  Filled 2015-12-15: qty 50

## 2015-12-15 MED ORDER — FUROSEMIDE 40 MG PO TABS: 40.0000 mg | ORAL_TABLET | Freq: Every day | ORAL | Status: DC

## 2015-12-15 MED ORDER — HEPARIN SOD (PORK) LOCK FLUSH 100 UNIT/ML IV SOLN
500.0000 [IU] | Freq: Once | INTRAVENOUS | Status: DC | PRN
  Filled 2015-12-15: qty 5

## 2015-12-15 MED ORDER — MAGNESIUM OXIDE 400 (241.3 MG) MG PO TABS
400.0000 mg | ORAL_TABLET | Freq: Every day | ORAL | Status: DC
  Administered 2015-12-16 – 2015-12-18 (×3): 400 mg via ORAL
  Filled 2015-12-15 (×3): qty 1

## 2015-12-15 MED ORDER — INSULIN ASPART 100 UNIT/ML ~~LOC~~ SOLN
0.0000 [IU] | Freq: Every day | SUBCUTANEOUS | Status: DC
  Administered 2015-12-15: 5 [IU] via SUBCUTANEOUS
  Administered 2015-12-17: 2 [IU] via SUBCUTANEOUS
  Filled 2015-12-15: qty 2
  Filled 2015-12-15: qty 3
  Filled 2015-12-15 (×2): qty 5

## 2015-12-15 MED ORDER — ACETAMINOPHEN 650 MG RE SUPP: 650.0000 mg | Freq: Four times a day (QID) | RECTAL | Status: DC | PRN

## 2015-12-15 MED ORDER — ASPIRIN 81 MG PO CHEW
81.0000 mg | CHEWABLE_TABLET | Freq: Every day | ORAL | Status: DC
  Administered 2015-12-16 – 2015-12-18 (×3): 81 mg via ORAL
  Filled 2015-12-15 (×3): qty 1

## 2015-12-15 MED ORDER — ISOSORBIDE MONONITRATE ER 60 MG PO TB24
60.0000 mg | ORAL_TABLET | Freq: Every day | ORAL | Status: DC
  Administered 2015-12-16 – 2015-12-18 (×3): 60 mg via ORAL
  Filled 2015-12-15 (×3): qty 1

## 2015-12-15 MED ORDER — CLONIDINE HCL 0.1 MG PO TABS
0.3000 mg | ORAL_TABLET | Freq: Two times a day (BID) | ORAL | Status: DC
  Administered 2015-12-15 – 2015-12-18 (×6): 0.3 mg via ORAL
  Filled 2015-12-15 (×2): qty 3
  Filled 2015-12-15: qty 2
  Filled 2015-12-15 (×4): qty 3

## 2015-12-15 MED ORDER — ONDANSETRON HCL 4 MG PO TABS: 8.0000 mg | ORAL_TABLET | Freq: Three times a day (TID) | ORAL | Status: DC | PRN

## 2015-12-15 MED ORDER — HEPARIN SODIUM (PORCINE) 5000 UNIT/ML IJ SOLN
5000.0000 [IU] | Freq: Three times a day (TID) | INTRAMUSCULAR | Status: DC
  Administered 2015-12-15 – 2015-12-18 (×7): 5000 [IU] via SUBCUTANEOUS
  Filled 2015-12-15 (×8): qty 1

## 2015-12-15 MED ORDER — NITROGLYCERIN 0.4 MG SL SUBL
0.4000 mg | SUBLINGUAL_TABLET | SUBLINGUAL | Status: DC | PRN
  Administered 2015-12-15: 0.4 mg via SUBLINGUAL
  Filled 2015-12-15: qty 1

## 2015-12-15 MED ORDER — AMLODIPINE BESYLATE 10 MG PO TABS
10.0000 mg | ORAL_TABLET | Freq: Every day | ORAL | Status: DC
  Administered 2015-12-16 – 2015-12-17 (×2): 10 mg via ORAL
  Filled 2015-12-15 (×2): qty 1

## 2015-12-15 MED ORDER — LIDOCAINE-PRILOCAINE 2.5-2.5 % EX CREA: 1.0000 "application " | TOPICAL_CREAM | CUTANEOUS | Status: DC | PRN

## 2015-12-15 MED ORDER — POTASSIUM CHLORIDE 10 MEQ/100ML IV SOLN
10.0000 meq | INTRAVENOUS | Status: AC
  Administered 2015-12-15 – 2015-12-16 (×4): 10 meq via INTRAVENOUS
  Filled 2015-12-15 (×4): qty 100

## 2015-12-15 MED ORDER — POTASSIUM CHLORIDE 20 MEQ/100ML IV SOLN: 20.0000 meq | Freq: Once | INTRAVENOUS | Status: DC

## 2015-12-15 MED ORDER — SODIUM CHLORIDE 0.9 % IV SOLN: 250.0000 mL | INTRAVENOUS | Status: DC | PRN

## 2015-12-15 MED ORDER — SODIUM CHLORIDE 0.9 % IJ SOLN
10.0000 mL | Freq: Once | INTRAMUSCULAR | Status: AC
  Administered 2015-12-15: 10 mL via INTRAVENOUS
  Filled 2015-12-15: qty 10

## 2015-12-15 MED ORDER — SODIUM CHLORIDE 0.9 % IJ SOLN
10.0000 mL | INTRAMUSCULAR | Status: DC | PRN
  Filled 2015-12-15: qty 10

## 2015-12-15 MED ORDER — ATORVASTATIN CALCIUM 20 MG PO TABS
20.0000 mg | ORAL_TABLET | Freq: Every day | ORAL | Status: DC
  Administered 2015-12-15 – 2015-12-17 (×3): 20 mg via ORAL
  Filled 2015-12-15 (×3): qty 1

## 2015-12-15 MED ORDER — INSULIN GLARGINE 100 UNIT/ML ~~LOC~~ SOLN
40.0000 [IU] | Freq: Every day | SUBCUTANEOUS | Status: DC
  Administered 2015-12-16: 20 [IU] via SUBCUTANEOUS
  Administered 2015-12-16: 40 [IU] via SUBCUTANEOUS
  Filled 2015-12-15 (×3): qty 0.4

## 2015-12-15 MED ORDER — HYDRALAZINE HCL 20 MG/ML IJ SOLN
10.0000 mg | Freq: Four times a day (QID) | INTRAMUSCULAR | Status: DC | PRN
  Administered 2015-12-15: 10 mg via INTRAVENOUS
  Filled 2015-12-15 (×2): qty 1

## 2015-12-15 MED ORDER — SODIUM CHLORIDE 0.9 % IV SOLN: 40.0000 meq | Freq: Once | INTRAVENOUS | Status: DC

## 2015-12-15 MED ORDER — DENOSUMAB 120 MG/1.7ML ~~LOC~~ SOLN
120.0000 mg | Freq: Once | SUBCUTANEOUS | Status: AC
  Administered 2015-12-15: 120 mg via SUBCUTANEOUS
  Filled 2015-12-15: qty 1.7

## 2015-12-15 MED ORDER — SODIUM CHLORIDE 0.9 % IJ SOLN: 3.0000 mL | INTRAMUSCULAR | Status: DC | PRN

## 2015-12-15 MED ORDER — SODIUM CHLORIDE 0.9 % IV SOLN
560.0000 mg | Freq: Once | INTRAVENOUS | Status: DC
  Filled 2015-12-15: qty 56

## 2015-12-15 MED ORDER — OXYCODONE-ACETAMINOPHEN 5-325 MG PO TABS
1.0000 | ORAL_TABLET | ORAL | Status: DC | PRN
  Administered 2015-12-16 – 2015-12-18 (×5): 1 via ORAL
  Filled 2015-12-15 (×5): qty 1

## 2015-12-15 MED ORDER — NITROGLYCERIN 2 % TD OINT
1.0000 [in_us] | TOPICAL_OINTMENT | TRANSDERMAL | Status: AC
  Administered 2015-12-15: 1 [in_us] via TOPICAL
  Filled 2015-12-15: qty 1

## 2015-12-15 MED ORDER — NITROGLYCERIN 2 % TD OINT: 1.0000 [in_us] | TOPICAL_OINTMENT | Freq: Four times a day (QID) | TRANSDERMAL | Status: DC

## 2015-12-15 MED ORDER — SPIRONOLACTONE 25 MG PO TABS
25.0000 mg | ORAL_TABLET | Freq: Every day | ORAL | Status: DC
  Administered 2015-12-16 – 2015-12-18 (×3): 25 mg via ORAL
  Filled 2015-12-15 (×3): qty 1

## 2015-12-15 MED ORDER — HYDRALAZINE HCL 20 MG/ML IJ SOLN: 10.0000 mg | Freq: Once | INTRAMUSCULAR | Status: DC

## 2015-12-15 NOTE — ED Notes (Signed)
Critical lab values K+ 2.9 & Troponin 0.04, MD made aware.

## 2015-12-15 NOTE — Progress Notes (Signed)
1430-elevated blood pressure.  Dr. Rudean Hitt made aware.

## 2015-12-15 NOTE — H&P (Signed)
[place] [facility] [place] NAME: Renee Wells    MR#:  [id number]  DATE OF BIRTH:  [birth year]  DATE OF ADMISSION:  12/15/2015  PRIMARY CARE PHYSICIAN: Sharyne Peach, MD   REQUESTING/REFERRING PHYSICIAN: Delman Kitten, MD  CHIEF COMPLAINT:   Chief Complaint  Patient presents with  . Hypertension   High blood pressure more than 200 today HISTORY OF PRESENT ILLNESS:  Renee Wells  is a 59 y.o. female with a known history of hypertension, diabetes, CHF and CVA. The patient presents to the ED with high blood pressure more than 200 after the chemotherapy today. Patient also complains of productive cough, shortness of breath and leg swelling for a few days. She said that she is compliant to her home medication. Her Lasix was decreased due to hypokalemia recently. Her blood pressure was 223/118. She was treated with  Nitropaste in the ED. Chest x-ray show pulmonary edema.  PAST MEDICAL HISTORY:   Past Medical History  Diagnosis Date  . Stroke [facility])     a. 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  . Malignant hypertension     a. Admitted 06/2015 and 11/2015;  b. 06/2015 nl aldosterone/renin activity;  c. Polypharmacy.  [name] HLD (hyperlipidemia)   . Morbid obesity ([place])   . Anasarca     a. 11/2015 Echo: EF 55-65%, triv TR/MR.  [name] Hypokalemia   . Type II diabetes mellitus ([place])     a. 11/2015 A1c 9.5.  [name] CHF (congestive heart failure) ([place])   . Metastatic cancer ([place])     a. 11/2015 CT abd/pelvis: large R hepatic lobe mass extending inf to pararenal space on R. Complete occlusion of R portal vein. L hepatic lobe lesion. Bilat LL pulm mets. Skeletal mets 2/ Fx of R iliac wing. Enhancing uterine fundal mass.  [name] History of CVA (cerebrovascular accident)   . Left-sided weakness   . IDA (iron deficiency anemia)   . Insomnia   . Cancer [facility])     PAST SURGICAL HISTORY:   Past Surgical History  Procedure Laterality Date  . Cesarean section     . Peripheral vascular catheterization N/A 12/10/2015    Procedure: Glori Luis Cath Insertion;  Surgeon: Katha Cabal, MD;  Location: [facility];  Service: Cardiovascular;  Laterality: N/A;    SOCIAL HISTORY:   Social History  Substance Use Topics  . Smoking status: Never Smoker   . Smokeless tobacco: Never Used  . Alcohol Use: No     Comment: occasional    FAMILY HISTORY:   Family History  Problem Relation Age of Onset  . Heart disease Mother   . Hypertension Mother   . Diabetes Mother   . Diabetes Sister   . Hyperlipidemia Sister     DRUG ALLERGIES:   Allergies  Allergen Reactions  . Penicillins Shortness Of Breath and Other (See Comments)    Has patient had a PCN reaction causing immediate rash, facial/tongue/throat swelling, SOB or lightheadedness with hypotension: Yes Has patient had a PCN reaction causing severe rash involving mucus membranes or skin necrosis: No Has patient had a PCN reaction that required hospitalization No Has patient had a PCN reaction occurring within the last 10 years: No If all of the above answers are "NO", then may proceed with Cephalosporin use.    REVIEW OF SYSTEMS:  CONSTITUTIONAL: No fever, no headache or dizziness but has generalized weakness.  EYES: No blurred or double vision.  EARS, NOSE,  AND THROAT: No tinnitus or ear pain.  RESPIRATORY: Has cough, shortness of breath, no wheezing or hemoptysis.  CARDIOVASCULAR: No chest pain, orthopnea, but has leg edema.  GASTROINTESTINAL: No nausea, vomiting, diarrhea or abdominal pain.  GENITOURINARY: No dysuria, hematuria.  ENDOCRINE: No polyuria, nocturia,  HEMATOLOGY: No anemia, easy bruising or bleeding SKIN: No rash or lesion. MUSCULOSKELETAL: No joint pain or arthritis.   NEUROLOGIC: No tingling, numbness, weakness.  PSYCHIATRY: No anxiety or depression.   MEDICATIONS AT HOME:   Prior to Admission medications   Medication Sig Start Date End Date Taking? Authorizing  Provider  amLODipine (NORVASC) 10 MG tablet Take 10 mg by mouth daily.   Yes Historical Provider, MD  aspirin 81 MG chewable tablet Chew 1 tablet (81 mg total) by mouth daily. 12/03/15  Yes Loletha Grayer, MD  atorvastatin (LIPITOR) 20 MG tablet Take 20 mg by mouth at bedtime. Reported on 12/07/2015   Yes Historical Provider, MD  cloNIDine (CATAPRES) 0.3 MG tablet Take 1 tablet (0.3 mg total) by mouth 2 (two) times daily. 12/03/15  Yes Loletha Grayer, MD  furosemide (LASIX) 40 MG tablet Take 1 tablet (40 mg total) by mouth daily. 12/15/15  Yes Dmitriy Berenzon, MD  hydrALAZINE (APRESOLINE) 50 MG tablet Take 1 tablet (50 mg total) by mouth 3 (three) times daily. 12/03/15  Yes Richard Leslye Peer, MD  insulin aspart (NOVOLOG) 100 UNIT/ML injection Inject 16 Units into the skin 3 (three) times daily with meals. 12/03/15  Yes Richard Leslye Peer, MD  insulin glargine (LANTUS) 100 UNIT/ML injection Inject 0.4 mLs (40 Units total) into the skin at bedtime. 12/03/15  Yes Loletha Grayer, MD  isosorbide mononitrate (IMDUR) 60 MG 24 hr tablet Take 60 mg by mouth daily.   Yes Historical Provider, MD  labetalol (NORMODYNE) 300 MG tablet Take 1 tablet (300 mg total) by mouth 2 (two) times daily. 12/03/15  Yes Loletha Grayer, MD  lidocaine-prilocaine (EMLA) cream Apply 1 application topically as needed (prior to accessing port).   Yes Historical Provider, MD  lisinopril (PRINIVIL,ZESTRIL) 40 MG tablet Take 40 mg by mouth daily.   Yes Historical Provider, MD  magnesium oxide (MAG-OX) 400 (241.3 MG) MG tablet Take 1 tablet (400 mg total) by mouth daily. 12/04/15  Yes Loletha Grayer, MD  ondansetron (ZOFRAN) 8 MG tablet Take 1 tablet (8 mg total) by mouth every 8 (eight) hours as needed for nausea or vomiting. 12/15/15  Yes Dmitriy Berenzon, MD  oxyCODONE-acetaminophen (ROXICET) 5-325 MG tablet Take 1 tablet by mouth every 4 (four) hours as needed for severe pain. 12/03/15  Yes Loletha Grayer, MD  potassium chloride SA  (K-DUR,KLOR-CON) 20 MEQ tablet Take 2 tablets (40 mEq total) by mouth 2 (two) times daily. 12/03/15  Yes Loletha Grayer, MD  spironolactone (ALDACTONE) 25 MG tablet Take 1 tablet (25 mg total) by mouth daily. 12/03/15  Yes Richard Leslye Peer, MD  irbesartan (AVAPRO) 75 MG tablet Take 1 tablet (75 mg total) by mouth daily. Patient not taking: Reported on 12/15/2015 12/03/15   Loletha Grayer, MD      VITAL SIGNS:  Blood pressure 210/108, pulse 85, temperature 98.5 F (36.9 C), temperature source Oral, resp. rate 24, height '5\' 4"'$  (1.626 m), weight 120.657 kg (266 lb), SpO2 92 %.  PHYSICAL EXAMINATION:  GENERAL:  59 y.o.-year-old patient lying in the bed with no acute distress. Morbidly obese. EYES: Pupils equal, round, reactive to light and accommodation. No scleral icterus. Extraocular muscles intact.  HEENT: Head atraumatic, normocephalic. Oropharynx and nasopharynx clear.  NECK:  Supple, no jugular venous distention. No thyroid enlargement, no tenderness.  LUNGS: Normal breath sounds bilaterally, no wheezing, mild rales. No use of accessory muscles of respiration.  CARDIOVASCULAR: S1, S2 normal. No murmurs, rubs, or gallops.  ABDOMEN: Soft, nontender, nondistended. Bowel sounds present. No organomegaly or mass.  EXTREMITIES: Bilateral lower extremity edema 2-3+, no cyanosis, or clubbing.  NEUROLOGIC: Cranial nerves II through XII are intact. Muscle strength 3/5 in all extremities. Sensation intact. Gait not checked.  PSYCHIATRIC: The patient is alert and oriented x 3.  SKIN: No obvious rash, lesion, or ulcer.   LABORATORY PANEL:   CBC  Recent Labs Lab 12/15/15 0834  WBC 9.7  HGB 8.2*  HCT 26.5*  PLT 115*   ------------------------------------------------------------------------------------------------------------------  Chemistries   Recent Labs Lab 12/15/15 0834  NA 136  K 2.5*  CL 91*  CO2 33*  GLUCOSE 224*  BUN 26*  CREATININE 0.90  CALCIUM 8.9  MG 1.9  AST 64*   ALT 68*  ALKPHOS 251*  BILITOT 0.6   ------------------------------------------------------------------------------------------------------------------  Cardiac Enzymes No results for input(s): TROPONINI in the last 168 hours. ------------------------------------------------------------------------------------------------------------------  RADIOLOGY:  Ct Chest W Contrast  12/14/2015  CLINICAL DATA:  Recently diagnosed with liver metastasis, poorly differentiated with neuroendocrine features. EXAM: CT CHEST WITH CONTRAST TECHNIQUE: Multidetector CT imaging of the chest was performed during intravenous contrast administration. CONTRAST:  10m OMNIPAQUE IOHEXOL 300 MG/ML  SOLN COMPARISON:  11/27/2015 abdominal CT.  Chest radiograph 11/26/2015. FINDINGS: Mediastinum/Nodes: A right-sided Port-A-Cath which terminates at the high right atrium. No supraclavicular adenopathy. Tortuous thoracic aorta. Moderate cardiomegaly, without pericardial effusion. No central pulmonary embolism, on this non-dedicated study. No mediastinal or hilar adenopathy. Lungs/Pleura: No pleural fluid. Minimal motion degradation. Innumerable bilateral pulmonary nodules, consistent with metastatic disease. Index right upper lobe nodule measures 1.0 cm on image 17/series 3. Index right lower lobe nodule measures 11 mm on image 35/series 3. Index left lower lobe pulmonary nodule measures 7 mm on image 34/series 3. Index left upper lobe nodule measures 12 mm on image 26/series 3. Upper abdomen: Bilateral hepatic metastasis, as detailed previously. Index lateral segment left liver lobe lesion measures 3.4 cm on image 46/series 2. Increased from 2.3 cm on the prior. Normal imaged portions of the spleen, pancreas, left adrenal gland, and left kidney. Musculoskeletal: No acute osseous abnormality. IMPRESSION: 1. Extensive pulmonary metastasis. 2. Bilateral liver masses/metastasis. Index left liver lobe lesion has enlarged significantly since  11/27/2015. Electronically Signed   By: KAbigail MiyamotoM.D.   On: 12/14/2015 12:53   Dg Chest Port 1 View  12/15/2015  CLINICAL DATA:  Metastatic neuroendocrine tumor. EXAM: PORTABLE CHEST 1 VIEW COMPARISON:  Chest CT dated 12/14/2015 and chest x-ray dated 11/26/2015 FINDINGS: Power port in place. Multiple pulmonary metastases demonstrate significant progression since the prior chest x-ray. There is new pulmonary vascular congestion with slight atelectasis at the lung bases, possibly due to a shallow inspiration. IMPRESSION: New pulmonary vascular congestion since the prior CT scan and chest x-ray. Extensive pulmonary metastases have appreciably progressed since 11/26/2015. Electronically Signed   By: JLorriane ShireM.D.   On: 12/15/2015 19:34    EKG:   Orders placed or performed during the hospital encounter of 12/15/15  . ED EKG within 10 minutes  . ED EKG within 10 minutes    IMPRESSION AND PLAN:   Hypertension malignancy. I will continue patient home hypertension medication, start hydralazine IV when necessary.  Acute on chronic diastolic CHF. Start CHF protocol, Lasix 40 mg  IV twice a day.  Hypokalemia. Give both IV and by mouth potassium supplement, follow-up BMP. Magnesium level is normal.  Anemia of chronic disease. Stable.  Diabetes. Start sliding scale and continue Lantus. Morbid obesity  Metastatic cancer. Status post chemotherapy, follow-up oncologist.   All the records are reviewed and case discussed with ED provider. Management plans discussed with the patient, her son and they are in agreement.  CODE STATUS: Full code  TOTAL TIME TAKING CARE OF THIS PATIENT: 57 minutes.    Demetrios Loll M.D on 12/15/2015 at 9:00 PM  Between 7am to 6pm - Pager - [phone]  After 6pm go to [email or web address] - password [email or web address] [facility]  Office  [phone]  CC: Primary care physician; Sharyne Peach, MD

## 2015-12-15 NOTE — Progress Notes (Signed)
Report taken from ED, Renee Wells. However, after report given, ED RN was asked to obtain orders for BP parameters, to notify MD, as well other PRN IV BP meds (due to hydralazine not being effective) and for SBP <180 prior to transfer-- see VS. Requested ED RN to call floor back with update on patient care requests. As requested, Renee Wells did so. Patient's BP remains elevated at 191/96. ED RN stated she has just administered IV Hydralazine and was sending the patient to the floor. She was asked to keep the patient long enough to reassess the effects of the IV Hydralazine. She accepted request.

## 2015-12-15 NOTE — ED Notes (Signed)
Attempted to call report, RN would not accept patient without BPS < 180 or HTN med parameters.

## 2015-12-15 NOTE — Progress Notes (Signed)
Hower, MD, entered care instruction orders, much appreciated, to notify MD if patient's BP >220/110.

## 2015-12-15 NOTE — ED Provider Notes (Signed)
[facility] Emergency Department Provider Note  REMINDER - THIS NOTE IS NOT A FINAL MEDICAL RECORD UNTIL IT IS SIGNED. UNTIL THEN, THE CONTENT BELOW MAY REFLECT INFORMATION FROM A DOCUMENTATION TEMPLATE, NOT THE ACTUAL PATIENT VISIT. ____________________________________________  Time seen: Approximately 8:24 PM  I have reviewed the triage vital signs and the nursing notes.   HISTORY  Chief Complaint Hypertension    HPI Renee Wells is a 59 y.o. female recent diagnosis of metastatic cancer. Also previous history of congestive heart failure, malignant hypertension.  Previous stroke.  Patient presents today with severe hypotension after chemotherapy. She reports associated shortness of breath. Also swelling significantly in her arms and legs. She does report a slight nonproductive cough at times the last few days.  She denies being in pain. She is on blood pressure medicine and her son reports he recently decreased her Lasix dose due to hypokalemia.   Past Medical History  Diagnosis Date  . Stroke [facility])     a. 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  . Malignant hypertension     a. Admitted 06/2015 and 11/2015;  b. 06/2015 nl aldosterone/renin activity;  c. Polypharmacy.  [name] HLD (hyperlipidemia)   . Morbid obesity ([place])   . Anasarca     a. 11/2015 Echo: EF 55-65%, triv TR/MR.  [name] Hypokalemia   . Type II diabetes mellitus ([place])     a. 11/2015 A1c 9.5.  [name] CHF (congestive heart failure) ([place])   . Metastatic cancer ([place])     a. 11/2015 CT abd/pelvis: large R hepatic lobe mass extending inf to pararenal space on R. Complete occlusion of R portal vein. L hepatic lobe lesion. Bilat LL pulm mets. Skeletal mets 2/ Fx of R iliac wing. Enhancing uterine fundal mass.  [name] History of CVA (cerebrovascular accident)   . Left-sided weakness   . IDA (iron deficiency anemia)   . Insomnia   . Cancer [facility])     Patient Active Problem List   Diagnosis Date Noted   . Acute on chronic systolic congestive heart failure ([place])   . Acute diastolic CHF (congestive heart failure) ([place]) 11/30/2015  . Metastatic cancer ([place])   . Type II diabetes mellitus ([place])   . Anasarca   . Morbid obesity ([place])   . Malignant hypertension   . Dyspnea on exertion   . Liver mass   . Transaminitis   . Hyperglycemia 06/27/2015  . Obesity 06/27/2015  . Accelerated hypertension 06/26/2015  . Hypokalemia 06/26/2015  . HLD (hyperlipidemia) 06/26/2015  . Suspected CHF (congestive heart failure) 06/26/2015  . Bilateral lower extremity edema 06/26/2015    Past Surgical History  Procedure Laterality Date  . Cesarean section    . Peripheral vascular catheterization N/A 12/10/2015    Procedure: Glori Luis Cath Insertion;  Surgeon: Katha Cabal, MD;  Location: [place] [facility];  Service: Cardiovascular;  Laterality: N/A;    Current Outpatient Rx  Name  Route  Sig  Dispense  Refill  . amLODipine (NORVASC) 10 MG tablet   Oral   Take 10 mg by mouth daily.         [name] aspirin 81 MG chewable tablet   Oral   Chew 1 tablet (81 mg total) by mouth daily.         [name] atorvastatin (LIPITOR) 20 MG tablet   Oral   Take 20 mg by mouth at bedtime. Reported on 12/07/2015         . cloNIDine (CATAPRES) 0.3  MG tablet   Oral   Take 1 tablet (0.3 mg total) by mouth 2 (two) times daily.   60 tablet   0   . furosemide (LASIX) 40 MG tablet   Oral   Take 1 tablet (40 mg total) by mouth daily.   60 tablet   0   . hydrALAZINE (APRESOLINE) 50 MG tablet   Oral   Take 1 tablet (50 mg total) by mouth 3 (three) times daily.   90 tablet   0   . insulin aspart (NOVOLOG) 100 UNIT/ML injection   Subcutaneous   Inject 16 Units into the skin 3 (three) times daily with meals.   10 mL   1   . insulin glargine (LANTUS) 100 UNIT/ML injection   Subcutaneous   Inject 0.4 mLs (40 Units total) into the skin at bedtime.   10 mL   1   . isosorbide mononitrate (IMDUR) 60 MG 24 hr  tablet   Oral   Take 60 mg by mouth daily.         [name] labetalol (NORMODYNE) 300 MG tablet   Oral   Take 1 tablet (300 mg total) by mouth 2 (two) times daily.   60 tablet   0   . lidocaine-prilocaine (EMLA) cream   Topical   Apply 1 application topically as needed (prior to accessing port).         [name] lisinopril (PRINIVIL,ZESTRIL) 40 MG tablet   Oral   Take 40 mg by mouth daily.         . magnesium oxide (MAG-OX) 400 (241.3 MG) MG tablet   Oral   Take 1 tablet (400 mg total) by mouth daily.   30 tablet   0   . ondansetron (ZOFRAN) 8 MG tablet   Oral   Take 1 tablet (8 mg total) by mouth every 8 (eight) hours as needed for nausea or vomiting.   90 tablet   0   . oxyCODONE-acetaminophen (ROXICET) 5-325 MG tablet   Oral   Take 1 tablet by mouth every 4 (four) hours as needed for severe pain.   30 tablet   0   . potassium chloride SA (K-DUR,KLOR-CON) 20 MEQ tablet   Oral   Take 2 tablets (40 mEq total) by mouth 2 (two) times daily.   60 tablet   0   . spironolactone (ALDACTONE) 25 MG tablet   Oral   Take 1 tablet (25 mg total) by mouth daily.   30 tablet   0   . irbesartan (AVAPRO) 75 MG tablet   Oral   Take 1 tablet (75 mg total) by mouth daily. Patient not taking: Reported on 12/15/2015   30 tablet   0     Allergies Penicillins  Family History  Problem Relation Age of Onset  . Heart disease Mother   . Hypertension Mother   . Diabetes Mother   . Diabetes Sister   . Hyperlipidemia Sister     Social History Social History  Substance Use Topics  . Smoking status: Never Smoker   . Smokeless tobacco: Never Used  . Alcohol Use: No     Comment: occasional    Review of Systems Constitutional: No fever/chills Eyes: No visual changes. ENT: No sore throat. Cardiovascular: Denies chest pain. Respiratory: See history of present illness Gastrointestinal: No abdominal pain.  No nausea, no vomiting.  No diarrhea.  No constipation. Genitourinary:  Negative for dysuria. Musculoskeletal: Negative for back pain. Skin: Negative for rash. Neurological:  Negative for headaches, focal weakness or numbness.  10-point ROS otherwise negative.  ____________________________________________   PHYSICAL EXAM:  VITAL SIGNS: ED Triage Vitals  Enc Vitals Group     BP 12/15/15 1724 217/121 mmHg     Pulse Rate 12/15/15 1724 85     Resp 12/15/15 1724 22     Temp 12/15/15 1724 98.5 F (36.9 C)     Temp Source 12/15/15 1724 Oral     SpO2 12/15/15 1724 92 %     Weight 12/15/15 1724 266 lb (120.657 kg)     Height 12/15/15 1724 '5\' 4"'$  (1.626 m)     Head Cir --      Peak Flow --      Pain Score 12/15/15 1725 0     Pain Loc --      Pain Edu? --      Excl. in [place]? --    Constitutional: Alert and oriented. Chronically ill-appearing. Eyes: Conjunctivae are normal. PERRL. EOMI. Head: Atraumatic. Nose: No congestion/rhinnorhea. Mouth/Throat: Mucous membranes are moist.  Oropharynx non-erythematous. Neck: No stridor.   Cardiovascular: Normal rate, regular rhythm. Grossly normal heart sounds though slightly distant.  Good peripheral circulation. Respiratory: Normal respiratory effort.  Mild rales heard in bases bilaterally. Lungs CTAB. Gastrointestinal: Soft and nontender. No distention. No abdominal bruits. No CVA tenderness. Musculoskeletal: Severe and diffuse anasarca. Moves all extremities well without focal deficits.  No joint effusions. Neurologic:  Normal speech and language. No gross focal neurologic deficits are appreciated. Skin:  Skin is warm, dry and intact. No rash noted. Psychiatric: Mood and affect are normal. Speech and behavior are normal.  ____________________________________________   LABS (all labs ordered are listed, but only abnormal results are displayed)  Labs Reviewed  BASIC METABOLIC PANEL  CBC  TROPONIN I  BRAIN NATRIURETIC PEPTIDE   ____________________________________________  EKG  Reviewed and interpreted by  me at 1730 Ventricular rate 90 PR 142 QRS 86 QTc 420 Normal sinus rhythm, left ventricular hypertrophy. Some artifact in the left cornea leads, no evidence of acute ischemic change ____________________________________________  RADIOLOGY  DG Chest Port 1 View (Final result) Result time: 12/15/15 19:34:51   Final result by Rad Results In Interface (12/15/15 19:34:51)   Narrative:   CLINICAL DATA: Metastatic neuroendocrine tumor.  EXAM: PORTABLE CHEST 1 VIEW  COMPARISON: Chest CT dated 12/14/2015 and chest x-ray dated 11/26/2015  FINDINGS: Power port in place. Multiple pulmonary metastases demonstrate significant progression since the prior chest x-ray. There is new pulmonary vascular congestion with slight atelectasis at the lung bases, possibly due to a shallow inspiration.  IMPRESSION: New pulmonary vascular congestion since the prior CT scan and chest x-ray. Extensive pulmonary metastases have appreciably progressed since 11/26/2015.   Electronically Signed By: Lorriane Shire M.D. On: 12/15/2015 19:34    ____________________________________________   PROCEDURES  Procedure(s) performed: None  Critical Care performed: No  ____________________________________________   INITIAL IMPRESSION / ASSESSMENT AND PLAN / ED COURSE  Pertinent labs & imaging results that were available during my care of the patient were reviewed by me and considered in my medical decision making (see chart for details).  Patient presents with severe hypertension with systolic blood pressure of 250 with associated dyspnea. She has a history of congestive heart failure presents today with severe anasarca which is been worsening since decreasing Lasix dose. She does report improvement on oxygen, she presents from the cancer center on 2 L. Given her severe hypertension with associated chest x-ray findings congestive heart failure, believe the patient  is likely suffering from severe  volume overload and congestive heart failure exacerbation. Potassium is 2.5, and we will continue to replete her potassium in the ER as we give Lasix. Overall her prognosis is guarded at this time, we will give IV Lasix, and nitroglycerin.  ----------------------------------------- 8:31 PM on 12/15/2015 -----------------------------------------    Patient reports her symptoms are slightly improved, her blood pressure was improved to 867 systolic which is marked improvement considering presentation with a blood pressure 672 systolic. [facility] for further care and workup. ____________________________________________   FINAL CLINICAL IMPRESSION(S) / ED DIAGNOSES  Final diagnoses:  Hypertensive urgency  Acute pulmonary edema (HCC)      Delman Kitten, MD 12/15/15 2032

## 2015-12-15 NOTE — Progress Notes (Signed)
[place] @ [facility] [phone]) [phone]  [phone]     BONA HUBBARD OB: [birth year]  MR#: [id number]  UMP#:[id number]  Patient Care Team: Sharyne Peach, MD as PCP - General (Family Medicine)  CHIEF COMPLAINT:  Chief Complaint  Patient presents with  . Metastatic cancer, unknown primary  . Chemotherapy    patient here for first carbo/vp16 tx  . bilateral lower extremity edema    worsens with ambulation and sitting long periods of time. States that edema slightly improved with the use of diurectics and elevation  . exertional shortness of breath     No history exists.    Oncology Flowsheet 06/27/2015 11/30/2015 12/01/2015 12/01/2015 12/02/2015 12/02/2015 12/03/2015  enoxaparin (LOVENOX) [place] 40 mg 40 mg 40 mg 40 mg 40 mg 40 mg 40 mg    HISTORY OF PRESENT ILLNESS:   Miss Espinal is a 59 year old African-American lady, who is admitted due to worsening fluid retention and hypokalemia, resistant hypertension and newly found hyperglycemia. During the workup she was found to have mild elevation in total bilirubin and ALT/AST, so the ultrasound of the liver was performed, which revealed the presence of a very large mass in the right lobe, as well as smaller lesions in the left lobe. Extensive workup was performed, which included CT-guided biopsy of the liver lesion. 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 AFP was within normal range and CEA was mildly elevated.   Current status: Patient has done fairly well since her last appointment. She had a Port-A-Cath placed, which she tolerated well. She continues to have lower extremity edema, for which she takes Lasix 40 mg twice a day. She denies abdominal pain, nausea, vomiting, diarrhea, constipation, change in the color of stool, weight loss, cough, chest pain, night sweats.  REVIEW OF SYSTEMS:   ROS   PAST MEDICAL HISTORY: Past Medical History  Diagnosis Date   . Stroke [facility])     a. 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  . Malignant hypertension     a. Admitted 06/2015 and 11/2015;  b. 06/2015 nl aldosterone/renin activity;  c. Polypharmacy.  [name] HLD (hyperlipidemia)   . Morbid obesity ([place])   . Anasarca     a. 11/2015 Echo: EF 55-65%, triv TR/MR.  [name] Hypokalemia   . Type II diabetes mellitus ([place])     a. 11/2015 A1c 9.5.  [name] CHF (congestive heart failure) ([place])   . Metastatic cancer ([place])     a. 11/2015 CT abd/pelvis: large R hepatic lobe mass extending inf to pararenal space on R. Complete occlusion of R portal vein. L hepatic lobe lesion. Bilat LL pulm mets. Skeletal mets 2/ Fx of R iliac wing. Enhancing uterine fundal mass.  [name] History of CVA (cerebrovascular accident)   . Left-sided weakness   . IDA (iron deficiency anemia)   . Insomnia   . Cancer [facility])     PAST SURGICAL HISTORY: Past Surgical History  Procedure Laterality Date  . Cesarean section    . Peripheral vascular catheterization N/A 12/10/2015    Procedure: Glori Luis Cath Insertion;  Surgeon: Katha Cabal, MD;  Location: [place] [facility];  Service: Cardiovascular;  Laterality: N/A;    FAMILY HISTORY Family History  Problem Relation Age of Onset  . Heart disease Mother   . Hypertension Mother   . Diabetes Mother   . Diabetes Sister   . Hyperlipidemia Sister     ADVANCED DIRECTIVES:  No flowsheet data found.  HEALTH  MAINTENANCE: Social History  Substance Use Topics  . Smoking status: Never Smoker   . Smokeless tobacco: Never Used  . Alcohol Use: No     Comment: occasional     Allergies  Allergen Reactions  . Penicillins Itching    Current Outpatient Prescriptions  Medication Sig Dispense Refill  . aspirin 81 MG chewable tablet Chew 1 tablet (81 mg total) by mouth daily.    [name] atorvastatin (LIPITOR) 20 MG tablet Take 20 mg by mouth daily. Reported on 12/07/2015    . blood glucose meter kit and supplies KIT Dispense based on patient and insurance  preference. Use up to four times daily as directed. (FOR ICD-9 250.00, 250.01). 1 each 0  . cloNIDine (CATAPRES) 0.3 MG tablet Take 1 tablet (0.3 mg total) by mouth 2 (two) times daily. 60 tablet 0  . furosemide (LASIX) 40 MG tablet Take 1 tablet (40 mg total) by mouth 2 (two) times daily. 60 tablet 0  . hydrALAZINE (APRESOLINE) 50 MG tablet Take 1 tablet (50 mg total) by mouth 3 (three) times daily. 90 tablet 0  . insulin aspart (NOVOLOG) 100 UNIT/ML injection Inject 16 Units into the skin 3 (three) times daily with meals. 10 mL 1  . insulin glargine (LANTUS) 100 UNIT/ML injection Inject 0.4 mLs (40 Units total) into the skin at bedtime. 10 mL 1  . INSULIN SYRINGE .5CC/28G 28G X 1/2" 0.5 ML MISC 1 Units by Does not apply route 4 (four) times daily. 200 each 5  . irbesartan (AVAPRO) 75 MG tablet Take 1 tablet (75 mg total) by mouth daily. 30 tablet 0  . isosorbide mononitrate (IMDUR) 60 MG 24 hr tablet Take 60 mg by mouth daily.    [name] labetalol (NORMODYNE) 300 MG tablet Take 1 tablet (300 mg total) by mouth 2 (two) times daily. 60 tablet 0  . lidocaine-prilocaine (EMLA) cream Apply cream 1 hour before chemotherapy treatment and place saran wrap over site to protect clothing 30 g 1  . magnesium oxide (MAG-OX) 400 (241.3 MG) MG tablet Take 1 tablet (400 mg total) by mouth daily. 30 tablet 0  . oxyCODONE-acetaminophen (ROXICET) 5-325 MG tablet Take 1 tablet by mouth every 4 (four) hours as needed for severe pain. 30 tablet 0  . potassium chloride SA (K-DUR,KLOR-CON) 20 MEQ tablet Take 2 tablets (40 mEq total) by mouth 2 (two) times daily. 60 tablet 0  . spironolactone (ALDACTONE) 25 MG tablet Take 1 tablet (25 mg total) by mouth daily. 30 tablet 0  . ondansetron (ZOFRAN) 8 MG tablet Take 1 tablet (8 mg total) by mouth every 8 (eight) hours as needed for nausea or vomiting. 90 tablet 0   No current facility-administered medications for this visit.    OBJECTIVE:  Filed Vitals:   12/15/15 0900  BP:  189/81  Pulse: 80  Temp: 98 F (36.7 C)  Resp: 18     Body mass index is 44.39 kg/(m^2).    ECOG FS:3 - Symptomatic, >50% confined to bed  Physical Exam  Constitutional: She is oriented to person, place, and time and well-developed, well-nourished, and in no distress. No distress.  Morbidly obese African-American female, in a wheelchair.  HENT:  Head: Normocephalic and atraumatic.  Right Ear: External ear normal.  Left Ear: External ear normal.  Mouth/Throat: Oropharynx is clear and moist.  Eyes: Conjunctivae are normal. Pupils are equal, round, and reactive to light. Right eye exhibits no discharge. Left eye exhibits no discharge. No scleral icterus.  Neck: Normal  range of motion. Neck supple. No JVD present. No tracheal deviation present. No thyromegaly present.  Cardiovascular: Normal rate, regular rhythm, normal heart sounds and intact distal pulses.  Exam reveals no gallop and no friction rub.   No murmur heard. Pulmonary/Chest: Effort normal and breath sounds normal. No stridor. No respiratory distress. She has no wheezes. She has no rales. She exhibits no tenderness.  Abdominal: Soft. Bowel sounds are normal. She exhibits no distension and no mass. There is no tenderness. There is no rebound and no guarding.  Genitourinary:  Postponed  Musculoskeletal: Normal range of motion. She exhibits edema (Bilateral 2+ pitting edema lower extremities, left upper extremity dependent edema.). She exhibits no tenderness.  Lymphadenopathy:    She has no cervical adenopathy.  Neurological: She is alert and oriented to person, place, and time. She has normal reflexes. No cranial nerve deficit. She exhibits normal muscle tone. Gait normal. Coordination normal. GCS score is 15.  Skin: Skin is warm. No rash noted. She is not diaphoretic. No erythema. No pallor.  This ecchymosis over the dorsal surface of the left hand and wrist  Psychiatric: Mood, memory, affect and judgment normal.  Nursing note  and vitals reviewed.  Constitutional: She is oriented to person, place, and time. She appears distressed (minimal respiratory distress, on oxygen supplementation).  Morbidly obese African-American female  HENT:  Head: Normocephalic and atraumatic.  Right Ear: External ear normal.  Left Ear: External ear normal.  Mouth/Throat: Oropharynx is clear and moist.  Eyes: Conjunctivae are normal. Pupils are equal, round, and reactive to light. Right eye exhibits no discharge. Left eye exhibits no discharge. No scleral icterus.  Neck: Normal range of motion. Neck supple. No JVD present. No tracheal deviation present. No thyromegaly present.  Cardiovascular: Normal rate, regular rhythm, normal heart sounds and intact distal pulses. Exam reveals no gallop and no friction rub.  No murmur heard. Pulmonary/Chest: Effort normal and breath sounds normal. No stridor. No respiratory distress. She has no wheezes. She has no rales. She exhibits no mass, no tenderness, no bony tenderness and no crepitus. Right breast exhibits no inverted nipple, no mass, no nipple discharge, no skin change and no tenderness. Left breast exhibits no inverted nipple, no mass, no nipple discharge, no skin change and no tenderness. Breasts are symmetrical. Right chest Port-A-Cath is in place, minimally tender with ecchymosis/hematoma.  LAB RESULTS:  CBC Latest Ref Rng 12/15/2015 12/02/2015  WBC 3.6 - 11.0 K/uL 9.7 6.3  Hemoglobin 12.0 - 16.0 g/dL 8.2(L) 8.8(L)  Hematocrit 35.0 - 47.0 % 26.5(L) 28.7(L)  Platelets 150 - 440 K/uL 115(L) 131(L)    Infusion on 12/15/2015  Component Date Value Ref Range Status  . WBC 12/15/2015 9.7  3.6 - 11.0 K/uL Final  . RBC 12/15/2015 3.56* 3.80 - 5.20 MIL/uL Final  . Hemoglobin 12/15/2015 8.2* 12.0 - 16.0 g/dL Final  . HCT 12/15/2015 26.5* 35.0 - 47.0 % Final  . MCV 12/15/2015 74.4* 80.0 - 100.0 fL Final  . MCH 12/15/2015 23.1* 26.0 - 34.0 pg Final  . MCHC 12/15/2015 31.1* 32.0 - 36.0 g/dL  Final  . RDW 12/15/2015 18.3* 11.5 - 14.5 % Final  . Platelets 12/15/2015 115* 150 - 440 K/uL Final  . Neutrophils Relative % 12/15/2015 90   Final  . Neutro Abs 12/15/2015 8.7* 1.4 - 6.5 K/uL Final  . Lymphocytes Relative 12/15/2015 3   Final  . Lymphs Abs 12/15/2015 0.3* 1.0 - 3.6 K/uL Final  . Monocytes Relative 12/15/2015 7  Final  . Monocytes Absolute 12/15/2015 0.7  0.2 - 0.9 K/uL Final  . Eosinophils Relative 12/15/2015 0   Final  . Eosinophils Absolute 12/15/2015 0.0  0 - 0.7 K/uL Final  . Basophils Relative 12/15/2015 0   Final  . Basophils Absolute 12/15/2015 0.0  0 - 0.1 K/uL Final  . Sodium 12/15/2015 136  135 - 145 mmol/L Final  . Potassium 12/15/2015 2.5* 3.5 - 5.1 mmol/L Final   Comment: RESULTS VERIFIED BY REPEAT TESTING CRITICAL RESULT CALLED TO, READ BACK BY AND VERIFIED WITH SHERRY VENABLE AT 7579 12/15/2015 KMR   . Chloride 12/15/2015 91* 101 - 111 mmol/L Final  . CO2 12/15/2015 33* 22 - 32 mmol/L Final  . Glucose, Bld 12/15/2015 224* 65 - 99 mg/dL Final  . BUN 12/15/2015 26* 6 - 20 mg/dL Final  . Creatinine, Ser 12/15/2015 0.90  0.44 - 1.00 mg/dL Final  . Calcium 12/15/2015 8.9  8.9 - 10.3 mg/dL Final  . Total Protein 12/15/2015 6.0* 6.5 - 8.1 g/dL Final  . Albumin 12/15/2015 3.2* 3.5 - 5.0 g/dL Final  . AST 12/15/2015 64* 15 - 41 U/L Final  . ALT 12/15/2015 68* 14 - 54 U/L Final  . Alkaline Phosphatase 12/15/2015 251* 38 - 126 U/L Final  . Total Bilirubin 12/15/2015 0.6  0.3 - 1.2 mg/dL Final  . GFR calc non Af Amer 12/15/2015 >60  >60 mL/min Final  . GFR calc Af Amer 12/15/2015 >60  >60 mL/min Final   Comment: (NOTE) The eGFR has been calculated using the CKD EPI equation. This calculation has not been validated in all clinical situations. eGFR's persistently <60 mL/min signify possible Chronic Kidney Disease.   . Anion gap 12/15/2015 12  5 - 15 Final    Surgical Pathology  * THIS IS AN ADDENDUM REPORT * CASE: [id number]  PATIENT: Ellin Mayhew    Surgical Pathology Report  *Addendum *  Reason for Addendum #1: Immunohistochemistry results   SPECIMEN SUBMITTED:  A. Liver mass, right lobe, CT biopsy   CLINICAL HISTORY:  Large liver mass unknown primary   PRE-OPERATIVE DIAGNOSIS:  Liver mass   POST-OPERATIVE DIAGNOSIS:  None provided      DIAGNOSIS:  A. LIVER, RIGHT LOBE; CT-GUIDED CORE BIOPSY:  - POORLY DIFFERENTIATED CARCINOMA WITH NEUROENDOCRINE FEATURES.   Comment:  Sections show a poorly differentiated malignant neoplasm with trabecular  growth pattern and large cells with eosinophilic or clear cytoplasm.  There is moderate nuclear pleomorphism and patchy necrosis. No  histologic glandular or squamous differentiation is present.   Immunohistochemistry (IHC) was performed for further characterization.  The neoplastic cells show diffuse strong staining for CD56, and patchy  staining for synaptophysin, pancytokeratins (AE1/AE3/Cam5.2), and CK7.  The cells are negative for the following antigens: HepPar1,  CEA-polyclonal, glypican-3, MOC-31, chromogranin, and GATA3.   The site of origin is not identified by this IHC panel, although it is  likely metastatic to the liver. I note the enhancing uterine mass seen  on the PET/CT scan, and for this reason I have added estrogen receptor  (ER) IHC which will be reported in an addendum.   The final diagnosis was called to Dr. Leslye Peer on 12/03/15 at 2:35 PM,  and I let him know that the ER would be reported in an addendum.   IHC slides were prepared by [facility], [facility], [place], and [facility], [place], [place], and  interpreted by Dr. Dicie Beam. All controls stained appropriately.    GROSS DESCRIPTION:  A. Labeled: patient's name and medical record number  Tissue fragment(s): 5  Size: 0.8-1.7 centimeters in length and diameter 0.1 cm  Description: red-brown cores/fragments, wrapped in lens paper marked  black and submitted in a  mesh bag   Entirely submitted in 1-3 cassette(s).    Final Diagnosis performed by Bryan Lemma, MD. Electronically signed  12/03/2015 3:18:30PM    The electronic signature indicates that the named Attending Pathologist  has evaluated the specimen   Technical component performed at [facility], [address], [address],  [place] [address]  Lab: [phone] Dir: Darrick Penna. Evette Doffing, MD   Professional component performed at [facility], [facility],  [place], [place], [place] [address]  Lab: [phone] Dir: Dellia Nims. Rubinas, MD    ADDENDUM:  The tumor cells are negative for estrogen receptors (ER) by IHC.   IHC slides were prepared by [facility], [facility], [place], and interpreted by Dr. Dicie Beam. All controls stained  appropriately.      Addendum #1 performed by Bryan Lemma, MD. Electronically signed  12/07/2015 12:48:05PM   Results for ELBERTA, LACHAPELLE (MRN [id number]) as of 12/07/2015 16:27  Ref. Range 12/03/2015 06:25  Sodium Latest Ref Range: 135-145 mmol/L 141  Potassium Latest Ref Range: 3.5-5.1 mmol/L 4.6  Chloride Latest Ref Range: 101-111 mmol/L 95 (L)  CO2 Latest Ref Range: 22-32 mmol/L 38 (H)  BUN Latest Ref Range: 6-20 mg/dL 24 (H)  Creatinine Latest Ref Range: 0.44-1.00 mg/dL 0.84  Calcium Latest Ref Range: 8.9-10.3 mg/dL 9.0  EGFR (Non-African Amer.) Latest Ref Range: >60 mL/min >60  EGFR (African American) Latest Ref Range: >60 mL/min >60  Glucose Latest Ref Range: 65-99 mg/dL 234 (H)  Anion gap Latest Ref Range: 5-15  8  Magnesium Latest Ref Range: 1.7-2.4 mg/dL 2.3  Results for JENEANE, PIECZYNSKI (MRN [id number]) as of 12/07/2015 16:27  Ref. Range 11/30/2015 06:37  Sodium Latest Ref Range: 135-145 mmol/L 143  Potassium Latest Ref Range: 3.5-5.1 mmol/L 2.6 (LL)  Chloride Latest Ref Range: 101-111 mmol/L 98 (L)  CO2 Latest Ref Range: 22-32 mmol/L 38 (H)  BUN Latest Ref Range: 6-20 mg/dL 21 (H)  Creatinine  Latest Ref Range: 0.44-1.00 mg/dL 0.92  Calcium Latest Ref Range: 8.9-10.3 mg/dL 8.8 (L)  EGFR (Non-African Amer.) Latest Ref Range: >60 mL/min >60  EGFR (African American) Latest Ref Range: >60 mL/min >60  Glucose Latest Ref Range: 65-99 mg/dL 263 (H)  Anion gap Latest Ref Range: 5-15  7  Magnesium Latest Ref Range: 1.7-2.4 mg/dL 2.0  Alkaline Phosphatase Latest Ref Range: 38-126 U/L 237 (H)  Albumin Latest Ref Range: 3.5-5.0 g/dL 3.1 (L)  AST Latest Ref Range: 15-41 U/L 33  ALT Latest Ref Range: 14-54 U/L 49  Total Protein Latest Ref Range: 6.5-8.1 g/dL 5.9 (L)  Total Bilirubin Latest Ref Range: 0.3-1.2 mg/dL 0.8    STUDIES: Ct Chest W Contrast  12/14/2015  CLINICAL DATA:  Recently diagnosed with liver metastasis, poorly differentiated with neuroendocrine features. EXAM: CT CHEST WITH CONTRAST TECHNIQUE: Multidetector CT imaging of the chest was performed during intravenous contrast administration. CONTRAST:  49m OMNIPAQUE IOHEXOL 300 MG/ML  SOLN COMPARISON:  11/27/2015 abdominal CT.  Chest radiograph 11/26/2015. FINDINGS: Mediastinum/Nodes: A right-sided Port-A-Cath which terminates at the high right atrium. No supraclavicular adenopathy. Tortuous thoracic aorta. Moderate cardiomegaly, without pericardial effusion. No central pulmonary embolism, on this non-dedicated study. No mediastinal or hilar adenopathy. Lungs/Pleura: No pleural fluid. Minimal motion degradation. Innumerable bilateral pulmonary nodules, consistent with  metastatic disease. Index right upper lobe nodule measures 1.0 cm on image 17/series 3. Index right lower lobe nodule measures 11 mm on image 35/series 3. Index left lower lobe pulmonary nodule measures 7 mm on image 34/series 3. Index left upper lobe nodule measures 12 mm on image 26/series 3. Upper abdomen: Bilateral hepatic metastasis, as detailed previously. Index lateral segment left liver lobe lesion measures 3.4 cm on image 46/series 2. Increased from 2.3 cm on the  prior. Normal imaged portions of the spleen, pancreas, left adrenal gland, and left kidney. Musculoskeletal: No acute osseous abnormality. IMPRESSION: 1. Extensive pulmonary metastasis. 2. Bilateral liver masses/metastasis. Index left liver lobe lesion has enlarged significantly since 11/27/2015. Electronically Signed   By: Abigail Miyamoto M.D.   On: 12/14/2015 12:53   Ct Pelvis W Contrast  11/27/2015  CLINICAL DATA:  Hepatic mass discovered on ultrasound. Concern for hepatic malignancy. EXAM: CT ABDOMEN WITHOUT AND WITH CONTRAST CT pelvis with contrast. TECHNIQUE: Multidetector CT imaging of the abdomen was performed following the standard protocol before and following the bolus administration of intravenous contrast. CT the pelvis performed following IV contrast. CONTRAST:  143m OMNIPAQUE IOHEXOL 350 MG/ML SOLN COMPARISON:  Ultrasound 11/27/2015, radiograph 11/26/2015 FINDINGS: Lower chest: Multiple round pulmonary nodules at the lung bases of varying size consistent with lung metastasis. Example lesion in the RIGHT lower lobe measures 10 mm image 10, series 3. Example lesion LEFT lower lobe measures 11 mm on image 4, series 3. Hepatobiliary: Large RIGHT hepatic lobe mass occupying the near entirety of the posterior RIGHT hepatic lobe (segments 5, 6 and 7). Mass measures 12 cm x 12 cm in axial dimension and 17 cm in craniocaudad dimension. RIGHT hepatic lobe mass is partially exophytic and extends inferiorly and compresses the RIGHT kidney and extend into the space between the RIGHT kidney and the IVC. Several nodular extension this at this level (image 112, series 8). There is a smaller lesion in the LEFT hepatic lobe measuring 2.2 cm on image 48, series 8. The LEFT portal vein is patent. 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 Main portal vein is patent. Pancreas: Pancreas is normal. No ductal dilatation. No pancreatic inflammation. Spleen: Normal spleen Adrenals/urinary tract: The the RIGHT adrenal gland is not  identified secondary to the hepatic mass. The RIGHT kidney is depressed inferiorly. The LEFT kidney adrenal gland appear normal. Stomach/Bowel: Stomach, small bowel, appendix, and cecum are normal. The colon and rectosigmoid colon are normal. Vascular/Lymphatic: No clear lymphadenopathy. Nodule lesion between the IVC in the RIGHT kidney on image 112 is felt to be extension of the hepatic tumor itself. No pelvic lymphadenopathy. Reproductive: The fundus of the uterus has enhancing mass measuring 7.8 x 9.2 by 6.7 cm. No adnexal abnormality is obvious. Other: No free fluid. Musculoskeletal: There is an aggressive lesion along the anterior margin of the RIGHT iliac bone. This lesion or erupts through the cortex and measures 4.0 by 4.6 cm on image 164, series 8. The sclerosis of the SI joints. IMPRESSION: 1. Large RIGHT hepatic lobe mass which extends inferior to the pararenal space on the RIGHT. 2. 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 LEFT hepatic lobe lesion additionally. 3. Bilateral lower lobe pulmonary metastasis. 4. 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 5. Enhancing mass in the uterine fundus is likely a benign leiomyoma. Cannot exclude leiomyosarcoma given the extensive metastatic disease in no clear primary. 6. Differential considerations would include primary hepatocellular carcinoma versus metastatic disease from unknown primary. Recommend biopsying liver  mass and FDG PET scan. Electronically Signed   By: Suzy Bouchard M.D.   On: 11/27/2015 14:21   Ct Abd Wo & W Cm  11/27/2015  CLINICAL DATA:  Hepatic mass discovered on ultrasound. Concern for hepatic malignancy. EXAM: CT ABDOMEN WITHOUT AND WITH CONTRAST CT pelvis with contrast. TECHNIQUE: Multidetector CT imaging of the abdomen was performed following the standard protocol before and following the bolus administration of intravenous contrast. CT the pelvis performed following IV contrast. CONTRAST:  134m OMNIPAQUE IOHEXOL  350 MG/ML SOLN COMPARISON:  Ultrasound 11/27/2015, radiograph 11/26/2015 FINDINGS: Lower chest: Multiple round pulmonary nodules at the lung bases of varying size consistent with lung metastasis. Example lesion in the RIGHT lower lobe measures 10 mm image 10, series 3. Example lesion LEFT lower lobe measures 11 mm on image 4, series 3. Hepatobiliary: Large RIGHT hepatic lobe mass occupying the near entirety of the posterior RIGHT hepatic lobe (segments 5, 6 and 7). Mass measures 12 cm x 12 cm in axial dimension and 17 cm in craniocaudad dimension. RIGHT hepatic lobe mass is partially exophytic and extends inferiorly and compresses the RIGHT kidney and extend into the space between the RIGHT kidney and the IVC. Several nodular extension this at this level (image 112, series 8). There is a smaller lesion in the LEFT hepatic lobe measuring 2.2 cm on image 48, series 8. The LEFT portal vein is patent. 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 Main portal vein is patent. Pancreas: Pancreas is normal. No ductal dilatation. No pancreatic inflammation. Spleen: Normal spleen Adrenals/urinary tract: The the RIGHT adrenal gland is not identified secondary to the hepatic mass. The RIGHT kidney is depressed inferiorly. The LEFT kidney adrenal gland appear normal. Stomach/Bowel: Stomach, small bowel, appendix, and cecum are normal. The colon and rectosigmoid colon are normal. Vascular/Lymphatic: No clear lymphadenopathy. Nodule lesion between the IVC in the RIGHT kidney on image 112 is felt to be extension of the hepatic tumor itself. No pelvic lymphadenopathy. Reproductive: The fundus of the uterus has enhancing mass measuring 7.8 x 9.2 by 6.7 cm. No adnexal abnormality is obvious. Other: No free fluid. Musculoskeletal: There is an aggressive lesion along the anterior margin of the RIGHT iliac bone. This lesion or erupts through the cortex and measures 4.0 by 4.6 cm on image 164, series 8. The sclerosis of the SI joints. IMPRESSION:  1. Large RIGHT hepatic lobe mass which extends inferior to the pararenal space on the RIGHT. 2. 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 LEFT hepatic lobe lesion additionally. 3. Bilateral lower lobe pulmonary metastasis. 4. 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 5. Enhancing mass in the uterine fundus is likely a benign leiomyoma. Cannot exclude leiomyosarcoma given the extensive metastatic disease in no clear primary. 6. Differential considerations would include primary hepatocellular carcinoma versus metastatic disease from unknown primary. Recommend biopsying liver mass and FDG PET scan. Electronically Signed   By: SSuzy BouchardM.D.   On: 11/27/2015 14:21   Ct Biopsy  11/29/2015  CLINICAL DATA:  Hepatic mass EXAM: CT GUIDED CORE BIOPSY OF HEPATIC MASS ANESTHESIA/SEDATION: NONE PROCEDURE: The procedure risks, benefits, and alternatives were explained to the patient. Questions regarding the procedure were encouraged and answered. The patient understands and consents to the procedure. The right anterior abdominal wall was prepped with chlorhexidinein a sterile fashion, and a sterile drape was applied covering the operative field. A sterile gown and sterile gloves were used for the procedure. Local anesthesia was provided with 1% Lidocaine. Utilizing  CT fluoroscopic guidance a 39 gauge guiding needle was placed percutaneously into the anterior aspect of the right lobe of the liver. It was placed at the margin of the hypodense mass lesion posteriorly. Multiple 18 gauge core biopsies were then obtained. The guiding needle was then removed in Gel-Foam placed to aid in hemostasis. No post procedure hematoma is noted. The patient tolerated the procedure well and was returned to her room in satisfactory condition. COMPLICATIONS: None immediate IMPRESSION: Successful CT-guided liver biopsy as described. Electronically Signed   By: Inez Catalina M.D.   On: 11/29/2015 13:53   Dg Chest  Portable 1 View  11/26/2015  CLINICAL DATA:  59 year old with progressively worsening shortness of breath and bilateral upper extremity and lower extremity edema over the past few days. EXAM: PORTABLE CHEST 1 VIEW COMPARISON:  06/03/2014. FINDINGS: Marked elevation of the right hemidiaphragm with scar/atelectasis at the right lung base. Cardiac silhouette mildly to moderately enlarged. Pulmonary venous hypertension without overt edema. No visible pleural effusions. IMPRESSION: 1. Cardiomegaly. Pulmonary venous hypertension without overt edema currently. 2. Elevation of the right hemidiaphragm with scar/atelectasis at the right lung base. Electronically Signed   By: Evangeline Dakin M.D.   On: 11/26/2015 17:56   Dg Hand Complete Left  11/26/2015  CLINICAL DATA:  Fall onto left hand. Left hand swelling and pain. Initial encounter. EXAM: LEFT HAND - COMPLETE 3+ VIEW COMPARISON:  None. FINDINGS: There is no evidence of fracture or dislocation. Mild degenerative spurring is seen involving the metacarpophalangeal joint of the thumb. No other bone abnormality identified. Marked soft tissue swelling is seen involving the dorsum of the hand and the wrist. IMPRESSION: Marked hand and wrist soft tissue swelling. No evidence of fracture or dislocation. Electronically Signed   By: Earle Gell M.D.   On: 11/26/2015 20:04   US Abdomen Limited Ruq  11/27/2015  CLINICAL DATA:  Elevated liver enzymes EXAM: US ABDOMEN LIMITED - RIGHT UPPER QUADRANT COMPARISON:  None. FINDINGS: Gallbladder: The gallbladder is predominantly contracted. No gallbladder wall thickening or pericholecystic fluid. Negative sonographic Murphy's sign per Common bile duct: Diameter: Normal at 4 mm. Liver: There is a large round heterogeneous solid-appearing mass within the RIGHT hepatic lobe measuring 18 by 11 by 12. This mass bulges the liver capsule. Additional smaller lesions noted in the LEFT hepatic lobe measuring up to 3.5 cm. No biliary duct  dilatation. Mild heterogeneity of the liver parenchyma. IMPRESSION: 1. 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 Recommend CT of the abdomen and pelvis with contrast for further evaluation. 2. Contracted gallbladder without evidence of cholecystitis. These results will be called to the ordering clinician or representative by the Radiologist Assistant, and communication documented in the PACS or zVision Dashboard. Electronically Signed   By: Suzy Bouchard M.D.   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 Previously I had a very extensive discussion with Mrs. Winfree and her son, and explained to them that unfortunately, it appears that we're dealing with a very aggressive form of malignancy, which behaves similar to small cell lung cancer. What complicates this situation is the fact that unfortunately Mrs. Lewan has a very extensive burden of the disease with a large liver based mass as well as several other lesions which appeared to be suspicious for metastatic lesion. We discussed the fact  that with a likelihood this disease is incurable, but likely to respond to a combination chemotherapy. Taking into account fairly poor performance status and an advanced stage of the disease, the combination of cisplatin and etoposide is likely going to be too toxic, as such, combination of carboplatin given at AUC of 5 and etoposide appears to be the most appropriate. We will use adjusted body weight, taking into account morbid obesity. Unfortunately, we should not expect long-term responses to this chemotherapy, and expected survival will likely be limited to few months. We discussed the potential side effects of this chemotherapy, which can include bone marrow suppression, renal  failure, mucositis.   #Bone metastasis-Taking into account the presence of right iliac crest metastatic lesion, she will need treatment with Xgeva.  #Hypokalemia-we will decrease Lasix to 40 mg once a day, administer 60 mEq of potassium chloride IV today, recheck BMP and magnesium tomorrow and the day after tomorrow. She will return to our clinic in 1 week to check on her clinical status as well as electrolytes and CBC.  Patient expressed understanding and was in agreement with this plan. She also understands that She can call clinic at any time with any questions, concerns, or complaints.   She will return to our clinic next week to initiate chemotherapy.  No matching staging information was found for the patient.  Roxana Hires, MD   12/15/2015 9:45 AM

## 2015-12-15 NOTE — ED Notes (Signed)
Pt was in Cancer center today for chemo infusion for metastatic CA. Pt was given IV potassium and infusion of chemo- VP16 and it was noted that her BP was elevated. Pt was given '10mg'$  IV hydralizine and pt took home med for BP but continues to be elevated. Pt denies sx's.

## 2015-12-15 NOTE — ED Notes (Signed)
Called pharmacy about drug compatibility, pharmacy confirmed mag, potassium, and lasix are all compatible

## 2015-12-16 ENCOUNTER — Inpatient Hospital Stay: Payer: Medicaid Other

## 2015-12-16 LAB — BASIC METABOLIC PANEL
Anion gap: 10 (ref 5–15)
BUN: 31 mg/dL — AB (ref 6–20)
CHLORIDE: 96 mmol/L — AB (ref 101–111)
CO2: 36 mmol/L — ABNORMAL HIGH (ref 22–32)
CREATININE: 1.03 mg/dL — AB (ref 0.44–1.00)
Calcium: 8.3 mg/dL — ABNORMAL LOW (ref 8.9–10.3)
GFR calc Af Amer: >60 mL/min (ref >60)
GFR calc non Af Amer: 58 mL/min — ABNORMAL LOW (ref >60)
Glucose, Bld: 327 mg/dL — ABNORMAL HIGH (ref 65–99)
Potassium: 3.5 mmol/L (ref 3.5–5.1)
SODIUM: 142 mmol/L (ref 135–145)

## 2015-12-16 LAB — CBC
HEMATOCRIT: 24.7 % — AB (ref 35.0–47.0)
HEMOGLOBIN: 7.6 g/dL — AB (ref 12.0–16.0)
MCH: 23.4 pg — ABNORMAL LOW (ref 26.0–34.0)
MCHC: 30.9 g/dL — ABNORMAL LOW (ref 32.0–36.0)
MCV: 75.6 fL — AB (ref 80.0–100.0)
Platelets: 98 10*3/uL — ABNORMAL LOW (ref 150–440)
RBC: 3.26 MIL/uL — ABNORMAL LOW (ref 3.80–5.20)
RDW: 18.7 % — AB (ref 11.5–14.5)
WBC: 8.8 10*3/uL (ref 3.6–11.0)

## 2015-12-16 LAB — GLUCOSE, CAPILLARY
GLUCOSE-CAPILLARY: 366 mg/dL — AB (ref 65–99)
GLUCOSE-CAPILLARY: 182 mg/dL — AB (ref 65–99)
Glucose-Capillary: 274 mg/dL — ABNORMAL HIGH (ref 65–99)
Glucose-Capillary: 179 mg/dL — ABNORMAL HIGH (ref 65–99)

## 2015-12-16 LAB — MAGNESIUM: Magnesium: 2.3 mg/dL (ref 1.7–2.4)

## 2015-12-16 LAB — PHOSPHORUS: PHOSPHORUS: 3 mg/dL (ref 2.5–4.6)

## 2015-12-16 MED ORDER — HYDRALAZINE HCL 50 MG PO TABS
100.0000 mg | ORAL_TABLET | Freq: Three times a day (TID) | ORAL | Status: DC
  Administered 2015-12-16 – 2015-12-18 (×6): 100 mg via ORAL
  Filled 2015-12-16 (×4): qty 2
  Filled 2015-12-16: qty 1
  Filled 2015-12-16 (×2): qty 2

## 2015-12-16 MED ORDER — INSULIN DETEMIR 100 UNIT/ML ~~LOC~~ SOLN
20.0000 [IU] | Freq: Every day | SUBCUTANEOUS | Status: DC
  Filled 2015-12-16: qty 0.2

## 2015-12-16 NOTE — Progress Notes (Signed)
Initial appointment at the [facility] has already been scheduled for December 29, 2015 at 9:00am from her previous hospitalization. Thank you.

## 2015-12-16 NOTE — Care Management (Signed)
Patient admitted from home with CHF.  Patient has recent diagnosis of metastatic cancer. Patient lives at home with her son and daughter in law.  Patient has chronic O2 at home provided by [employer].  Previous admission referral was made to [facility] for RN and aide.  Patient states that she has had many appointments, and the home nurse hasn't been able to come out yet.  I spoke with Brittney from [facility].  Multiple attempts were made to contact patient.  Patient requested for start of services 12/14/15, then [facility] was unable to reach the patient at that time.  Patient would like to initiate services at time of discharge.  Resumption of care order, and face to face will need to be completed by MD.  [facility] following for discharge planning

## 2015-12-16 NOTE — Progress Notes (Signed)
Inpatient Diabetes Program Recommendations  AACE/ADA: New Consensus Statement on Inpatient Glycemic Control (2015)  Target Ranges:  Prepandial:   less than 140 mg/dL      Peak postprandial:   less than 180 mg/dL (1-2 hours)      Critically ill patients:  140 - 180 mg/dL   Review of Glycemic Control  Results for JONIYA, BOBERG (MRN [id number]) as of 12/16/2015 10:27  Ref. Range 12/15/2015 23:09 12/16/2015 07:46  Glucose-Capillary Latest Ref Range: 65-99 mg/dL 400 (H) 274 (H)    Diabetes history: Type 2 Outpatient Diabetes medications: Novolog 16 units tid with meals, Lantus 40 units qhs Current orders for Inpatient glycemic control: Novolog 16 units tid with meals, Lantus 40 units qhs, Novolog 0-9 units tid, Novolog 0-5 units qhs  Inpatient Diabetes Program Recommendations: Elevated blood sugar likely as a result of dexamethasone- consider increasing Lantus insulin to 45 units (A1C 9.5%)  Will follow.  Gentry Fitz, RN, BA, MHA, CDE Diabetes Coordinator Inpatient Diabetes Program  [phone] (Team Pager) [phone] ([place]) 12/16/2015 10:32 AM

## 2015-12-16 NOTE — Progress Notes (Signed)
Initial Nutrition Assessment     INTERVENTION:   Meals and Snacks: recommend addition of Carb Modified (Diabetic) Diet to current diet order; Cater to patient preferences Education: pt educated on diabetic, heart healthy diet on her last admission earlier this month; pt received written and brief verbal education. Will provide education on follow-up if requested  NUTRITION DIAGNOSIS:   Reassess on follow-up  GOAL:   Patient will meet greater than or equal to 90% of their needs  MONITOR:    (Energy Intake, Anthropometrics, Electrolyte/Renal Profile, Glucose profile, Digestive System)  REASON FOR ASSESSMENT:   Diagnosis    ASSESSMENT:    Pt admitted with malignant HTN, acute on chronic CHF, recent dx of metastatic cancer  Past Medical History  Diagnosis Date  . Stroke [facility])     a. 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  . Malignant hypertension     a. Admitted 06/2015 and 11/2015;  b. 06/2015 nl aldosterone/renin activity;  c. Polypharmacy.  [name] HLD (hyperlipidemia)   . Morbid obesity ([place])   . Anasarca     a. 11/2015 Echo: EF 55-65%, triv TR/MR.  [name] Hypokalemia   . Type II diabetes mellitus ([place])     a. 11/2015 A1c 9.5.  [name] CHF (congestive heart failure) ([place])   . Metastatic cancer ([place])     a. 11/2015 CT abd/pelvis: large R hepatic lobe mass extending inf to pararenal space on R. Complete occlusion of R portal vein. L hepatic lobe lesion. Bilat LL pulm mets. Skeletal mets 2/ Fx of R iliac wing. Enhancing uterine fundal mass.  [name] History of CVA (cerebrovascular accident)   . Left-sided weakness   . IDA (iron deficiency anemia)   . Insomnia   . Cancer [facility])     Diet Order: Heart Healthy   Energy Intake: appetite good, eating well  Glucose Profile:  Recent Labs  12/15/15 2309 12/16/15 0746 12/16/15 1109  GLUCAP 400* 274* 366*   Electrolyte and Renal Profile:  Recent Labs Lab 12/15/15 0834 12/15/15 2007 12/15/15 2344 12/16/15 0518  BUN 26* 28*  --   31*  CREATININE 0.90 0.90  --  1.03*  NA 136 144  --  142  K 2.5* 2.9*  --  3.5  MG 1.9  --  2.3  --   PHOS  --   --  3.0  --    Meds: ss novolog, lasix, lantus, magox, potassium chloride  Height:   Ht Readings from Last 1 Encounters:  12/15/15 '5\' 4"'$  (1.626 m)    Weight: weight trend as per weight encounters  Wt Readings from Last 1 Encounters:  12/16/15 264 lb 8 oz (119.976 kg)    Wt Readings from Last 10 Encounters:  12/16/15 264 lb 8 oz (119.976 kg)  12/15/15 266 lb 12.1 oz (121 kg)  12/07/15 268 lb 9.6 oz (121.836 kg)  12/03/15 256 lb 6.4 oz (116.302 kg)  06/25/15 249 lb (112.946 kg)    BMI:  Body mass index is 45.38 kg/(m^2).  LOW Care Level  Kerman Passey MS, [place], LDN [phone] Pager  [phone] Weekend/On-Call Pager

## 2015-12-16 NOTE — Progress Notes (Signed)
[place] [facility] [place] NAME: Renee Wells    MR#:  [id number]  DATE OF BIRTH:  [birth year]  SUBJECTIVE:  CHIEF COMPLAINT:  Pts SOB is better, denies any chest pain  REVIEW OF SYSTEMS:  CONSTITUTIONAL: No fever, fatigue or weakness.  EYES: No blurred or double vision.  EARS, NOSE, AND THROAT: No tinnitus or ear pain.  RESPIRATORY: No cough, better shortness of breath, wheezing or hemoptysis.  CARDIOVASCULAR: No chest pain, orthopnea, edema.  GASTROINTESTINAL: No nausea, vomiting, diarrhea or abdominal pain.  GENITOURINARY: No dysuria, hematuria.  ENDOCRINE: No polyuria, nocturia,  HEMATOLOGY: No anemia, easy bruising or bleeding SKIN: No rash or lesion. MUSCULOSKELETAL: No joint pain or arthritis.   NEUROLOGIC: No tingling, numbness, weakness.  PSYCHIATRY: No anxiety or depression.   DRUG ALLERGIES:   Allergies  Allergen Reactions  . Penicillins Shortness Of Breath and Other (See Comments)    Has patient had a PCN reaction causing immediate rash, facial/tongue/throat swelling, SOB or lightheadedness with hypotension: Yes Has patient had a PCN reaction causing severe rash involving mucus membranes or skin necrosis: No Has patient had a PCN reaction that required hospitalization No Has patient had a PCN reaction occurring within the last 10 years: No If all of the above answers are "NO", then may proceed with Cephalosporin use.    VITALS:  Blood pressure 157/80, pulse 89, temperature 98 F (36.7 C), temperature source Oral, resp. rate 18, height '5\' 4"'$  (1.626 m), weight 119.976 kg (264 lb 8 oz), SpO2 94 %.  PHYSICAL EXAMINATION:  GENERAL:  59 y.o.-year-old patient lying in the bed with no acute distress.  EYES: Pupils equal, round, reactive to light and accommodation. No scleral icterus. Extraocular muscles intact.  HEENT: Head atraumatic, normocephalic. Oropharynx and nasopharynx clear.  NECK:  Supple, no jugular venous  distention. No thyroid enlargement, no tenderness.  LUNGS: Moderate breath sounds bilaterally, no wheezing, positive rales,rhonchi or crepitation. No use of accessory muscles of respiration.  CARDIOVASCULAR: S1, S2 normal. No murmurs, rubs, or gallops.  ABDOMEN: Soft, nontender, nondistended. Bowel sounds present. No organomegaly or mass.  EXTREMITIES: No pedal edema, cyanosis, or clubbing.  NEUROLOGIC: Cranial nerves II through XII are intact. Muscle strength 5/5 in all extremities. Sensation intact. Gait not checked.  PSYCHIATRIC: The patient is alert and oriented x 3.  SKIN: No obvious rash, lesion, or ulcer.    LABORATORY PANEL:   CBC  Recent Labs Lab 12/16/15 0518  WBC 8.8  HGB 7.6*  HCT 24.7*  PLT 98*   ------------------------------------------------------------------------------------------------------------------  Chemistries   Recent Labs Lab 12/15/15 0834  12/15/15 2344 12/16/15 0518  NA 136  < >  --  142  K 2.5*  < >  --  3.5  CL 91*  < >  --  96*  CO2 33*  < >  --  36*  GLUCOSE 224*  < >  --  327*  BUN 26*  < >  --  31*  CREATININE 0.90  < >  --  1.03*  CALCIUM 8.9  < >  --  8.3*  MG 1.9  --  2.3  --   AST 64*  --   --   --   ALT 68*  --   --   --   ALKPHOS 251*  --   --   --   BILITOT 0.6  --   --   --   < > = values in this interval not  displayed. ------------------------------------------------------------------------------------------------------------------  Cardiac Enzymes  Recent Labs Lab 12/15/15 2007  TROPONINI 0.04*   ------------------------------------------------------------------------------------------------------------------  RADIOLOGY:  Dg Chest Port 1 View  12/15/2015  CLINICAL DATA:  Metastatic neuroendocrine tumor. EXAM: PORTABLE CHEST 1 VIEW COMPARISON:  Chest CT dated 12/14/2015 and chest x-ray dated 11/26/2015 FINDINGS: Power port in place. Multiple pulmonary metastases demonstrate significant progression since the prior  chest x-ray. There is new pulmonary vascular congestion with slight atelectasis at the lung bases, possibly due to a shallow inspiration. IMPRESSION: New pulmonary vascular congestion since the prior CT scan and chest x-ray. Extensive pulmonary metastases have appreciably progressed since 11/26/2015. Electronically Signed   By: Lorriane Shire M.D.   On: 12/15/2015 19:34    1. EKG:   Orders placed or performed during the hospital encounter of 12/15/15  . ED EKG within 10 minutes  . ED EKG within 10 minutes    ASSESSMENT AND PLAN:   1. Hypertension malignancy. I will continue patient home hypertension medication, hydralazine dose increased to 100 mg, titrare prn   hydralazine IV when necessary.  Acute on chronic diastolic CHF. Start CHF protocol, Lasix 40 mg IV .  Hypokalemia. Give both IV and by mouth potassium supplement, follow-up BMP. Magnesium level is normal.  Anemia of chronic disease. Stable.  Diabetes. Start sliding scale and continue Lantus. Morbid obesity  Metastatic  liver cancer with neuroendocrine features. Status post chemotherapy, follow-up oncologist.     All the records are reviewed and case discussed with Care Management/Social Workerr. Management plans discussed with the patient, family and they are in agreement.  CODE STATUS:fc  TOTAL TIME TAKING CARE OF THIS PATIENT: 35  minutes.   POSSIBLE D/C IN 2-3  DAYS, DEPENDING ON CLINICAL CONDITION.   Nicholes Mango M.D on 12/16/2015 at 7:37 PM  Between 7am to 6pm - Pager - [phone] After 6pm go to [email or web address] - password [email or web address] [facility]  Office  [phone]  CC: Primary care physician; Sharyne Peach, MD

## 2015-12-17 ENCOUNTER — Inpatient Hospital Stay: Payer: Medicaid Other

## 2015-12-17 LAB — BASIC METABOLIC PANEL
ANION GAP: 8 (ref 5–15)
BUN: 32 mg/dL — AB (ref 6–20)
CHLORIDE: 97 mmol/L — AB (ref 101–111)
CO2: 38 mmol/L — ABNORMAL HIGH (ref 22–32)
Calcium: 8.2 mg/dL — ABNORMAL LOW (ref 8.9–10.3)
Creatinine, Ser: 0.82 mg/dL (ref 0.44–1.00)
GFR calc Af Amer: >60 mL/min (ref >60)
GLUCOSE: 137 mg/dL — AB (ref 65–99)
POTASSIUM: 4 mmol/L (ref 3.5–5.1)
Sodium: 143 mmol/L (ref 135–145)

## 2015-12-17 LAB — GLUCOSE, CAPILLARY
GLUCOSE-CAPILLARY: 97 mg/dL (ref 65–99)
GLUCOSE-CAPILLARY: 203 mg/dL — AB (ref 65–99)
Glucose-Capillary: 175 mg/dL — ABNORMAL HIGH (ref 65–99)
Glucose-Capillary: 201 mg/dL — ABNORMAL HIGH (ref 65–99)

## 2015-12-17 MED ORDER — FUROSEMIDE 10 MG/ML IJ SOLN
20.0000 mg | Freq: Two times a day (BID) | INTRAMUSCULAR | Status: DC
  Administered 2015-12-17 – 2015-12-18 (×2): 20 mg via INTRAVENOUS
  Filled 2015-12-17 (×2): qty 2

## 2015-12-17 MED ORDER — AMLODIPINE BESYLATE 5 MG PO TABS
5.0000 mg | ORAL_TABLET | Freq: Every day | ORAL | Status: DC
  Administered 2015-12-18: 5 mg via ORAL
  Filled 2015-12-17: qty 1

## 2015-12-17 MED ORDER — INSULIN GLARGINE 100 UNIT/ML ~~LOC~~ SOLN
20.0000 [IU] | Freq: Every day | SUBCUTANEOUS | Status: DC
  Administered 2015-12-17: 20 [IU] via SUBCUTANEOUS
  Filled 2015-12-17 (×2): qty 0.2

## 2015-12-17 NOTE — Progress Notes (Signed)
PT Cancellation Note  Patient Details Name: Renee Wells MRN: [id number] DOB: 1956/05/22   Cancelled Treatment:    Reason Eval/Treat Not Completed: Other (comment) (Pt having dinner and will check in AM).     Ramond Dial 12/17/2015, 6:17 PM   Mee Hives, PT MS Acute Rehab Dept. Number: [facility] [id number] and [place] [phone]

## 2015-12-17 NOTE — Progress Notes (Signed)
Inpatient Diabetes Program Recommendations  AACE/ADA: New Consensus Statement on Inpatient Glycemic Control (2015)  Target Ranges:  Prepandial:   less than 140 mg/dL      Peak postprandial:   less than 180 mg/dL (1-2 hours)      Critically ill patients:  140 - 180 mg/dL   Review of Glycemic Control  Results for Renee Wells, Renee Wells (MRN [id number]) as of 12/17/2015 09:51  Ref. Range 12/16/2015 11:09 12/16/2015 16:36 12/16/2015 20:59 12/17/2015 04:27 12/17/2015 07:37  Glucose-Capillary Latest Ref Range: 65-99 mg/dL 366 (H) 182 (H) 179 (H)  97    Diabetes history: Type 2 Outpatient Diabetes medications: Novolog 16 units tid with meals, Lantus 40 units qhs Current orders for Inpatient glycemic control: Novolog 16 units tid with meals, Lantus 40 units qhs, Novolog 0-9 units tid, Novolog 0-5 units qhs  Inpatient Diabetes Program Recommendations: * only given 20 units of Lantus last evening (vs. 40 units ordered)- fasting blood sugars '97mg'$ /dl .    Please change order to Lantus 20 units qhs.   Gentry Fitz, RN, BA, MHA, CDE Diabetes Coordinator Inpatient Diabetes Program  [phone] (Team Pager) [phone] ([place]) 12/17/2015 9:53 AM

## 2015-12-17 NOTE — Progress Notes (Signed)
[facility] [place] NAME: Renee Wells    MR#:  [id number]  DATE OF BIRTH:  [birth year]  SUBJECTIVE:  CHIEF COMPLAINT:  Pts SOB is better, denies any chest pain. Becoming short of breath with exertion  REVIEW OF SYSTEMS:  CONSTITUTIONAL: No fever, fatigue or weakness.  EYES: No blurred or double vision.  EARS, NOSE, AND THROAT: No tinnitus or ear pain.  RESPIRATORY: No cough, exertional shortness of breath, wheezing or hemoptysis.  CARDIOVASCULAR: No chest pain, orthopnea, edema.  GASTROINTESTINAL: No nausea, vomiting, diarrhea or abdominal pain.  GENITOURINARY: No dysuria, hematuria.  ENDOCRINE: No polyuria, nocturia,  HEMATOLOGY: No anemia, easy bruising or bleeding SKIN: No rash or lesion. MUSCULOSKELETAL: No joint pain or arthritis.   NEUROLOGIC: No tingling, numbness, weakness.  PSYCHIATRY: No anxiety or depression.   DRUG ALLERGIES:   Allergies  Allergen Reactions  . Penicillins Shortness Of Breath and Other (See Comments)    Has patient had a PCN reaction causing immediate rash, facial/tongue/throat swelling, SOB or lightheadedness with hypotension: Yes Has patient had a PCN reaction causing severe rash involving mucus membranes or skin necrosis: No Has patient had a PCN reaction that required hospitalization No Has patient had a PCN reaction occurring within the last 10 years: No If all of the above answers are "NO", then may proceed with Cephalosporin use.    VITALS:  Blood pressure 109/53, pulse 78, temperature 98 F (36.7 C), temperature source Oral, resp. rate 20, height '5\' 4"'$  (1.626 m), weight 120.022 kg (264 lb 9.6 oz), SpO2 96 %.  PHYSICAL EXAMINATION:  GENERAL:  59 y.o.-year-old patient lying in the bed with no acute distress.  EYES: Pupils equal, round, reactive to light and accommodation. No scleral icterus. Extraocular muscles intact.  HEENT: Head atraumatic, normocephalic. Oropharynx and nasopharynx  clear.  NECK:  Supple, no jugular venous distention. No thyroid enlargement, no tenderness.  LUNGS: Moderate breath sounds bilaterally, no wheezing, positive rales,rhonchi or crepitation. No use of accessory muscles of respiration.  CARDIOVASCULAR: S1, S2 normal. No murmurs, rubs, or gallops.  ABDOMEN: Soft, nontender, nondistended. Bowel sounds present. No organomegaly or mass.  EXTREMITIES: No pedal edema, cyanosis, or clubbing.  NEUROLOGIC: Cranial nerves II through XII are intact. Muscle strength 5/5 in all extremities. Sensation intact. Gait not checked.  PSYCHIATRIC: The patient is alert and oriented x 3.  SKIN: No obvious rash, lesion, or ulcer.    LABORATORY PANEL:   CBC  Recent Labs Lab 12/16/15 0518  WBC 8.8  HGB 7.6*  HCT 24.7*  PLT 98*   ------------------------------------------------------------------------------------------------------------------  Chemistries   Recent Labs Lab 12/15/15 0834  12/15/15 2344  12/17/15 0427  NA 136  < >  --   < > 143  K 2.5*  < >  --   < > 4.0  CL 91*  < >  --   < > 97*  CO2 33*  < >  --   < > 38*  GLUCOSE 224*  < >  --   < > 137*  BUN 26*  < >  --   < > 32*  CREATININE 0.90  < >  --   < > 0.82  CALCIUM 8.9  < >  --   < > 8.2*  MG 1.9  --  2.3  --   --   AST 64*  --   --   --   --   ALT 68*  --   --   --   --  ALKPHOS 251*  --   --   --   --   BILITOT 0.6  --   --   --   --   < > = values in this interval not displayed. ------------------------------------------------------------------------------------------------------------------  Cardiac Enzymes  Recent Labs Lab 12/15/15 2007  TROPONINI 0.04*   ------------------------------------------------------------------------------------------------------------------  RADIOLOGY:  Dg Chest Port 1 View  12/15/2015  CLINICAL DATA:  Metastatic neuroendocrine tumor. EXAM: PORTABLE CHEST 1 VIEW COMPARISON:  Chest CT dated 12/14/2015 and chest x-ray dated 11/26/2015  FINDINGS: Power port in place. Multiple pulmonary metastases demonstrate significant progression since the prior chest x-ray. There is new pulmonary vascular congestion with slight atelectasis at the lung bases, possibly due to a shallow inspiration. IMPRESSION: New pulmonary vascular congestion since the prior CT scan and chest x-ray. Extensive pulmonary metastases have appreciably progressed since 11/26/2015. Electronically Signed   By: Lorriane Shire M.D.   On: 12/15/2015 19:34    1. EKG:   Orders placed or performed during the hospital encounter of 12/15/15  . ED EKG within 10 minutes  . ED EKG within 10 minutes    ASSESSMENT AND PLAN:   1. Hypertension malignancy. Improved I will continue patient home hypertension medication, hydralazine dose increased to 100 mg, titrare prn  Amlodipine dose is changed to 5 mg by mouth once daily  hydralazine IV when necessary.  Acute on chronic diastolic CHF. Start CHF protocol, Lasix 40 mg IV changed to 20 milli grams IV every 12  Hypokalemia. Give both IV and by mouth potassium supplement. Magnesium level is normal.  Anemia of chronic disease. Stable.  Diabetes.  Start sliding scale and continue Lantus at small dose 20 units daily at bedtime as patient was hypoglycemic last night. Patient takes 40 units at bedtime at home. Morbid obesity  Metastatic  liver cancer with neuroendocrine features. Status post chemotherapy, follow-up oncologist.     All the records are reviewed and case discussed with Care Management/Social Workerr. Management plans discussed with the patient, family and they are in agreement.  CODE STATUS:fc  TOTAL TIME TAKING CARE OF THIS PATIENT: 35  minutes.   POSSIBLE D/C IN 2-3  DAYS, DEPENDING ON CLINICAL CONDITION.   Nicholes Mango M.D on 12/17/2015 at 2:40 PM  Between 7am to 6pm - Pager - [phone] After 6pm go to [email or web address] - password [email or web address] [facility]  Office   [phone]  CC: Primary care physician; Sharyne Peach, MD

## 2015-12-18 LAB — BASIC METABOLIC PANEL
Anion gap: 8 (ref 5–15)
BUN: 29 mg/dL — AB (ref 6–20)
CALCIUM: 7.9 mg/dL — AB (ref 8.9–10.3)
CHLORIDE: 96 mmol/L — AB (ref 101–111)
CO2: 36 mmol/L — AB (ref 22–32)
CREATININE: 0.78 mg/dL (ref 0.44–1.00)
GFR calc non Af Amer: >60 mL/min (ref >60)
Glucose, Bld: 181 mg/dL — ABNORMAL HIGH (ref 65–99)
Potassium: 4.1 mmol/L (ref 3.5–5.1)
SODIUM: 140 mmol/L (ref 135–145)

## 2015-12-18 LAB — GLUCOSE, CAPILLARY
GLUCOSE-CAPILLARY: 225 mg/dL — AB (ref 65–99)
Glucose-Capillary: 153 mg/dL — ABNORMAL HIGH (ref 65–99)
Glucose-Capillary: 222 mg/dL — ABNORMAL HIGH (ref 65–99)

## 2015-12-18 MED ORDER — HEPARIN SOD (PORK) LOCK FLUSH 10 UNIT/ML IV SOLN
10.0000 [IU] | Freq: Once | INTRAVENOUS | Status: DC
  Filled 2015-12-18: qty 1

## 2015-12-18 MED ORDER — INSULIN GLARGINE 100 UNIT/ML ~~LOC~~ SOLN: 25.0000 [IU] | Freq: Every day | SUBCUTANEOUS | Status: AC

## 2015-12-18 NOTE — Discharge Summary (Signed)
[facility] at [place] NAME: Renee Wells    MR#:  [id number]  DATE OF BIRTH:  1956-06-02  DATE OF ADMISSION:  12/15/2015 ADMITTING PHYSICIAN: Demetrios Loll, MD  DATE OF DISCHARGE: 12/18/2015 PRIMARY CARE PHYSICIAN: Sharyne Peach, MD    ADMISSION DIAGNOSIS:  Acute pulmonary edema (HCC) [J81.0] Hypertensive urgency [I16.0]  DISCHARGE DIAGNOSIS:  Principal Problem:   Acute on chronic diastolic CHF (congestive heart failure), NYHA class 1 ([place])   SECONDARY DIAGNOSIS:   Past Medical History  Diagnosis Date  . Stroke [facility])     a. 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  . Malignant hypertension     a. Admitted 06/2015 and 11/2015;  b. 06/2015 nl aldosterone/renin activity;  c. Polypharmacy.  [name] HLD (hyperlipidemia)   . Morbid obesity ([place])   . Anasarca     a. 11/2015 Echo: EF 55-65%, triv TR/MR.  [name] Hypokalemia   . Type II diabetes mellitus ([place])     a. 11/2015 A1c 9.5.  [name] CHF (congestive heart failure) ([place])   . Metastatic cancer ([place])     a. 11/2015 CT abd/pelvis: large R hepatic lobe mass extending inf to pararenal space on R. Complete occlusion of R portal vein. L hepatic lobe lesion. Bilat LL pulm mets. Skeletal mets 2/ Fx of R iliac wing. Enhancing uterine fundal mass.  [name] History of CVA (cerebrovascular accident)   . Left-sided weakness   . IDA (iron deficiency anemia)   . Insomnia   . Cancer [facility])     HOSPITAL COURSE:  1. Hypertension malignancy. Improved I will continue patient home hypertension medication, hydralazine dose increased to 100 mg, titrare prn  Amlodipine dose is changed to 5 mg by mouth once daily   Acute on chronic diastolic CHF. Started  CHF protocol, Lasix 40 mg IV changed to 20 milli grams po every 12, patient clinically is doing fine Outpatient follow-up with CHF clinic is recommended. Outpatient follow-up with cardiology as recommended.  Hypokalemia. Give both IV and by mouth potassium  supplement. Improved. Magnesium level is normal.  Anemia of chronic disease. Stable.  Diabetes.  Patient being hypoglycemic her home dose Lantus is changed to 25 units subcutaneous daily at bedtime, Patient takes 40 units at bedtime at home.   Metastatic liver cancer with neuroendocrine features. Status post chemotherapy, follow-up oncologist outpatient  DISCHARGE CONDITIONS:   fair  CONSULTS OBTAINED:      PROCEDURES none  DRUG ALLERGIES:   Allergies  Allergen Reactions  . Penicillins Shortness Of Breath and Other (See Comments)    Has patient had a PCN reaction causing immediate rash, facial/tongue/throat swelling, SOB or lightheadedness with hypotension: Yes Has patient had a PCN reaction causing severe rash involving mucus membranes or skin necrosis: No Has patient had a PCN reaction that required hospitalization No Has patient had a PCN reaction occurring within the last 10 years: No If all of the above answers are "NO", then may proceed with Cephalosporin use.    DISCHARGE MEDICATIONS:   Current Discharge Medication List    CONTINUE these medications which have CHANGED   Details  insulin glargine (LANTUS) 100 UNIT/ML injection Inject 0.25 mLs (25 Units total) into the skin at bedtime. Qty: 10 mL, Refills: 1      CONTINUE these medications which have NOT CHANGED   Details  amLODipine (NORVASC) 10 MG tablet Take 10 mg by mouth daily.    aspirin 81 MG chewable tablet Chew 1 tablet (81  mg total) by mouth daily.    atorvastatin (LIPITOR) 20 MG tablet Take 20 mg by mouth at bedtime. Reported on 12/07/2015    cloNIDine (CATAPRES) 0.3 MG tablet Take 1 tablet (0.3 mg total) by mouth 2 (two) times daily. Qty: 60 tablet, Refills: 0    furosemide (LASIX) 40 MG tablet Take 1 tablet (40 mg total) by mouth daily. Qty: 60 tablet, Refills: 0    hydrALAZINE (APRESOLINE) 50 MG tablet Take 1 tablet (50 mg total) by mouth 3 (three) times daily. Qty: 90 tablet, Refills: 0     insulin aspart (NOVOLOG) 100 UNIT/ML injection Inject 16 Units into the skin 3 (three) times daily with meals. Qty: 10 mL, Refills: 1    isosorbide mononitrate (IMDUR) 60 MG 24 hr tablet Take 60 mg by mouth daily.    labetalol (NORMODYNE) 300 MG tablet Take 1 tablet (300 mg total) by mouth 2 (two) times daily. Qty: 60 tablet, Refills: 0    lidocaine-prilocaine (EMLA) cream Apply 1 application topically as needed (prior to accessing port).    lisinopril (PRINIVIL,ZESTRIL) 40 MG tablet Take 40 mg by mouth daily.    magnesium oxide (MAG-OX) 400 (241.3 MG) MG tablet Take 1 tablet (400 mg total) by mouth daily. Qty: 30 tablet, Refills: 0    ondansetron (ZOFRAN) 8 MG tablet Take 1 tablet (8 mg total) by mouth every 8 (eight) hours as needed for nausea or vomiting. Qty: 90 tablet, Refills: 0    oxyCODONE-acetaminophen (ROXICET) 5-325 MG tablet Take 1 tablet by mouth every 4 (four) hours as needed for severe pain. Qty: 30 tablet, Refills: 0    potassium chloride SA (K-DUR,KLOR-CON) 20 MEQ tablet Take 2 tablets (40 mEq total) by mouth 2 (two) times daily. Qty: 60 tablet, Refills: 0    spironolactone (ALDACTONE) 25 MG tablet Take 1 tablet (25 mg total) by mouth daily. Qty: 30 tablet, Refills: 0    irbesartan (AVAPRO) 75 MG tablet Take 1 tablet (75 mg total) by mouth daily. Qty: 30 tablet, Refills: 0         DISCHARGE INSTRUCTIONS:   Heart Failure Clinic appointment on December 29, 2015 at 9:00am with Darylene Price, [place]. Please call [phone] to reschedule.   Activity as tolerated, patient is mostly wheelchair bound-home health ordered Continue 2-2.5 liters of oxygen via nasal cannula Diet heart healthy, diabetic Follow-up with primary care physician and cardiology in a week as recommended Follow-up with oncology as recommended in a week   DIET:  Cardiac diet, diabetic diet  DISCHARGE CONDITION:  Fair  ACTIVITY:  Activity as tolerated, her home health PT, patient is  mostly wheelchair bound  OXYGEN:  Home Oxygen: Yes.     Oxygen Delivery: 2 and half liters/min via Patient connected to nasal cannula oxygen  DISCHARGE LOCATION:  home   If you experience worsening of your admission symptoms, develop shortness of breath, life threatening emergency, suicidal or homicidal thoughts you must seek medical attention immediately by calling 911 or calling your MD immediately  if symptoms less severe.  You Must read complete instructions/literature along with all the possible adverse reactions/side effects for all the Medicines you take and that have been prescribed to you. Take any new Medicines after you have completely understood and accpet all the possible adverse reactions/side effects.   Please note  You were cared for by a hospitalist during your hospital stay. If you have any questions about your discharge medications or the care you received while you were in the hospital  after you are discharged, you can call the unit and asked to speak with the hospitalist on call if the hospitalist that took care of you is not available. Once you are discharged, your primary care physician will handle any further medical issues. Please note that NO REFILLS for any discharge medications will be authorized once you are discharged, as it is imperative that you return to your primary care physician (or establish a relationship with a primary care physician if you do not have one) for your aftercare needs so that they can reassess your need for medications and monitor your lab values.     Today  Chief Complaint  Patient presents with  . Hypertension   Patient is resting comfortably. Denies any shortness of breath or chest pain. Feeling fine but tired as she didn't sleep well last night. Evaluated by physical therapy who has recommended home health. Patient is mostly wheelchair bound and uses 2 and half liters of oxygen via nasal cannula at home  ROS:  CONSTITUTIONAL:  Denies fevers, chills. Denies any fatigue, weakness.  EYES: Denies blurry vision, double vision, eye pain. EARS, NOSE, THROAT: Denies tinnitus, ear pain, hearing loss. RESPIRATORY: Denies cough, wheeze, shortness of breath.  CARDIOVASCULAR: Denies chest pain, palpitations, edema.  GASTROINTESTINAL: Denies nausea, vomiting, diarrhea, abdominal pain. Denies bright red blood per rectum. GENITOURINARY: Denies dysuria, hematuria. ENDOCRINE: Denies nocturia or thyroid problems. HEMATOLOGIC AND LYMPHATIC: Denies easy bruising or bleeding. SKIN: Denies rash or lesion. MUSCULOSKELETAL: Denies pain in neck, back, shoulder, knees, hips or arthritic symptoms.  NEUROLOGIC: Denies paralysis, paresthesias.  PSYCHIATRIC: Denies anxiety or depressive symptoms.   VITAL SIGNS:  Blood pressure 150/68, pulse 97, temperature 98 F (36.7 C), temperature source Oral, resp. rate 18, height '5\' 4"'$  (1.626 m), weight 117.54 kg (259 lb 2.1 oz), SpO2 89 %.  I/O:   Intake/Output Summary (Last 24 hours) at 12/18/15 1254 Last data filed at 12/18/15 1049  Gross per 24 hour  Intake    720 ml  Output   3500 ml  Net  -2780 ml    PHYSICAL EXAMINATION:  GENERAL:  59 y.o.-year-old patient lying in the bed with no acute distress.  EYES: Pupils equal, round, reactive to light and accommodation. No scleral icterus. Extraocular muscles intact.  HEENT: Head atraumatic, normocephalic. Oropharynx and nasopharynx clear.  NECK:  Supple, no jugular venous distention. No thyroid enlargement, no tenderness.  LUNGS: Normal breath sounds bilaterally, no wheezing, rales,rhonchi or crepitation. No use of accessory muscles of respiration.  CARDIOVASCULAR: S1, S2 normal. No murmurs, rubs, or gallops.  ABDOMEN: Soft, non-tender, non-distended. Bowel sounds present. No organomegaly or mass.  EXTREMITIES: No pedal edema, cyanosis, or clubbing.  NEUROLOGIC: Cranial nerves II through XII are intact. Muscle strength at her baseline, chronic  left-sided residual weakness. Sensation intact. Gait not checked.  PSYCHIATRIC: The patient is alert and oriented x 3.  SKIN: No obvious rash, lesion, or ulcer.   DATA REVIEW:   CBC  Recent Labs Lab 12/16/15 0518  WBC 8.8  HGB 7.6*  HCT 24.7*  PLT 98*    Chemistries   Recent Labs Lab 12/15/15 0834  12/15/15 2344  12/18/15 0535  NA 136  < >  --   < > 140  K 2.5*  < >  --   < > 4.1  CL 91*  < >  --   < > 96*  CO2 33*  < >  --   < > 36*  GLUCOSE 224*  < >  --   < >  181*  BUN 26*  < >  --   < > 29*  CREATININE 0.90  < >  --   < > 0.78  CALCIUM 8.9  < >  --   < > 7.9*  MG 1.9  --  2.3  --   --   AST 64*  --   --   --   --   ALT 68*  --   --   --   --   ALKPHOS 251*  --   --   --   --   BILITOT 0.6  --   --   --   --   < > = values in this interval not displayed.  Cardiac Enzymes  Recent Labs Lab 12/15/15 2007  TROPONINI 0.04*    Microbiology Results  Results for orders placed or performed during the hospital encounter of 06/25/15  MRSA PCR Screening     Status: None   Collection Time: 06/26/15  5:31 AM  Result Value Ref Range Status   MRSA by PCR NEGATIVE NEGATIVE Final    Comment:        The GeneXpert MRSA Assay (FDA approved for NASAL specimens only), is one component of a comprehensive MRSA colonization surveillance program. It is not intended to diagnose MRSA infection nor to guide or monitor treatment for MRSA infections.     RADIOLOGY:  Dg Chest Port 1 View  12/15/2015  CLINICAL DATA:  Metastatic neuroendocrine tumor. EXAM: PORTABLE CHEST 1 VIEW COMPARISON:  Chest CT dated 12/14/2015 and chest x-ray dated 11/26/2015 FINDINGS: Power port in place. Multiple pulmonary metastases demonstrate significant progression since the prior chest x-ray. There is new pulmonary vascular congestion with slight atelectasis at the lung bases, possibly due to a shallow inspiration. IMPRESSION: New pulmonary vascular congestion since the prior CT scan and chest x-ray.  Extensive pulmonary metastases have appreciably progressed since 11/26/2015. Electronically Signed   By: Lorriane Shire M.D.   On: 12/15/2015 19:34    EKG:   Orders placed or performed during the hospital encounter of 12/15/15  . ED EKG within 10 minutes  . ED EKG within 10 minutes      Management plans discussed with the patient, family and they are in agreement.  CODE STATUS:     Code Status Orders        Start     Ordered   12/15/15 2211  Full code   Continuous     12/15/15 2210      TOTAL TIME TAKING CARE OF THIS PATIENT: 45  minutes.    '@[facility]'$ @  on 12/18/2015 at 12:54 PM  Between 7am to 6pm - Pager - [phone]  After 6pm go to [email or web address] - password [email or web address] [facility]  Office  [phone]  CC: Primary care physician; Sharyne Peach, MD

## 2015-12-18 NOTE — Care Management Note (Signed)
Case Management Note  Patient Details  Name: Renee Wells MRN: [id number] Date of Birth: 1956-10-14  Subjective/Objective:   Renee Wells is on chronic oxygen. A referral for home health PT and RN was faxed to [facility]. Resumption of care.                  Action/Plan:   Expected Discharge Date:                  Expected Discharge Plan:     In-House Referral:     Discharge planning Services     Post Acute Care Choice:    Choice offered to:     DME Arranged:    DME Agency:     HH Arranged:    [place] Agency:     Status of Service:     Medicare Important Message Given:    Date Medicare IM Given:    Medicare IM give by:    Date Additional Medicare IM Given:    Additional Medicare Important Message give by:     If discussed at [place] of Stay Meetings, dates discussed:    Additional Comments:  Ansel Ferrall A, RN 12/18/2015, 2:21 PM

## 2015-12-18 NOTE — Discharge Instructions (Signed)
Heart Failure Clinic appointment on December 29, 2015 at 9:00am with Darylene Price, [place]. Please call [phone] to reschedule.   Activity as tolerated, patient is mostly wheelchair bound-home health ordered Continue 2-2.5 liters of oxygen via nasal cannula Diet heart healthy, diabetic Follow-up with primary care physician and cardiology in a week as recommended

## 2015-12-18 NOTE — Evaluation (Signed)
Physical Therapy Evaluation Patient Details Name: Renee Wells MRN: [id number] DOB: Dec 09, 1956 Today's Date: 12/18/2015   History of Present Illness  59 yo female with onset of CHF with chronic O2 use at 2L at home, has old L side weakness from stroke 3 years ago.  Has planned to get home with family assistance.  PMHx:  liver CA with new pulmonary mets, on chemotherapy, HTN, CVA, CHF  Clinical Impression  Pt was able to get up to walk only a short trip on RW, with trip to [facility] and to chair to eat.  Her weakness is significant and will be unsafe to climb steps to enter apt now.  Have spoken to case management to talk about options for her, will need to get follow up therapy, preferably SNF    Follow Up Recommendations  SNF     Equipment Recommendations   RW if pt does not have one.    Recommendations for Other Services       Precautions / Restrictions Precautions Precautions: Fall Restrictions Weight Bearing Restrictions: No      Mobility  Bed Mobility Overal bed mobility: Needs Assistance Bed Mobility: Supine to Sit     Supine to sit: Min assist;HOB elevated;Mod assist     General bed mobility comments: mod assist to scoot to EOB  Transfers Overall transfer level: Needs assistance Equipment used: Rolling [place] (2 wheeled);1 person hand held assist Transfers: Sit to/from [facility] Sit to Stand: Min assist;Mod assist Stand pivot transfers: Min assist       General transfer comment: extra times for sliding the LLE  Ambulation/Gait Ambulation/Gait assistance: Min assist Ambulation Distance (Feet): 4 Feet Assistive device: Rolling [place] (2 wheeled) Gait Pattern/deviations: Step-to pattern;Trunk flexed;Wide base of support;Shuffle Gait velocity: decreased Gait velocity interpretation: Below normal speed for age/gender General Gait Details: L foot drop with unsteady stance and sidestepping tochair.  Pt needs assist to move [place] and to set up and  gauge her distance to sit from standing at chair  Stairs            Wheelchair Mobility    Modified Rankin (Stroke Patients Only)       Balance Overall balance assessment: Needs assistance Sitting-balance support: Feet supported Sitting balance-Leahy Scale: Fair   Postural control: Posterior lean Standing balance support: Bilateral upper extremity supported Standing balance-Leahy Scale: Poor Standing balance comment: unsteady to control on BLE's but wiht vc's is better to distribute on BLE's                             Pertinent Vitals/Pain Pain Assessment: No/denies pain    Home Living Family/patient expects to be discharged to:: Private residence Living Arrangements: Children Available Help at Discharge: Family;Available PRN/intermittently Type of Home: Apartment Home Access: Stairs to enter Entrance Stairs-Rails: Right;Left;Can reach both Entrance Stairs-Number of Steps: 12-15 steps with Bilateral rails Home Layout: One level Home Equipment: Rodocker - standard;Cane - quad      Prior Function Level of Independence: Needs assistance   Gait / Transfers Assistance Needed: used [place], was limited to short distances  ADL's / Homemaking Assistance Needed: son and family did most of cooking/cleaning;  Comments: patient reports that she mostly sponge bathed prior to admission. She is able to negotiate steps at home but requires assistance from her son.      Hand Dominance   Dominant Hand: Right    Extremity/Trunk Assessment  Communication   Communication: No difficulties  Cognition Arousal/Alertness: Awake/alert Behavior During Therapy: WFL for tasks assessed/performed Overall Cognitive Status: Within Functional Limits for tasks assessed                      General Comments General comments (skin integrity, edema, etc.): Pt is up in chair with expectation that she can climb a flight of steps to enter  her apt with son's help.  Her weakness is significant, and she was not walking well prior to her trip to hospital    Exercises        Assessment/Plan    PT Assessment    PT Diagnosis     PT Problem List    PT Treatment Interventions     PT Goals (Current goals can be found in the Care Plan section) Acute Rehab PT Goals Patient Stated Goal: I want to get better and get home    Frequency     Barriers to discharge        Co-evaluation               End of Session Equipment Utilized During Treatment: Oxygen Activity Tolerance: Patient tolerated treatment well;Patient limited by fatigue Patient left: in chair;with call bell/phone within reach;with chair alarm set Nurse Communication: Mobility status         Time: 1209-1237 PT Time Calculation (min) (ACUTE ONLY): 28 min   Charges:   PT Evaluation $Initial PT Evaluation Tier I: 1 Procedure PT Treatments $Gait Training: 8-22 mins   PT G Codes:        Ramond Dial 23-Dec-2015, 1:25 PM   Mee Hives, PT MS Acute Rehab Dept. Number: [facility] [id number] and [place] [phone]

## 2015-12-22 ENCOUNTER — Ambulatory Visit: Payer: Medicaid Other

## 2015-12-22 ENCOUNTER — Other Ambulatory Visit: Payer: Medicaid Other

## 2015-12-22 ENCOUNTER — Ambulatory Visit: Payer: Medicaid Other | Admitting: Oncology

## 2015-12-22 NOTE — H&P (Signed)
[facility] Admission History & Physical  MRN : [id number]  Renee Wells is a 60 y.o. ([birth year]) female who presents with chief complaint of metastatic diffusely disseminated carcinoma  History of Present Illness: Patient is sent by the [facility] having been seen within 30 days multiple times by the referring physicians for placement of an Infuse-a-Port she has disseminated carcinoma.  No current facility-administered medications for this encounter.   Current Outpatient Prescriptions  Medication Sig Dispense Refill  . aspirin 81 MG chewable tablet Chew 1 tablet (81 mg total) by mouth daily.    [name] atorvastatin (LIPITOR) 20 MG tablet Take 20 mg by mouth at bedtime. Reported on 12/07/2015    . cloNIDine (CATAPRES) 0.3 MG tablet Take 1 tablet (0.3 mg total) by mouth 2 (two) times daily. 60 tablet 0  . hydrALAZINE (APRESOLINE) 50 MG tablet Take 1 tablet (50 mg total) by mouth 3 (three) times daily. 90 tablet 0  . insulin aspart (NOVOLOG) 100 UNIT/ML injection Inject 16 Units into the skin 3 (three) times daily with meals. 10 mL 1  . irbesartan (AVAPRO) 75 MG tablet Take 1 tablet (75 mg total) by mouth daily. (Patient not taking: Reported on 12/15/2015) 30 tablet 0  . isosorbide mononitrate (IMDUR) 60 MG 24 hr tablet Take 60 mg by mouth daily.    [name] labetalol (NORMODYNE) 300 MG tablet Take 1 tablet (300 mg total) by mouth 2 (two) times daily. 60 tablet 0  . magnesium oxide (MAG-OX) 400 (241.3 MG) MG tablet Take 1 tablet (400 mg total) by mouth daily. 30 tablet 0  . oxyCODONE-acetaminophen (ROXICET) 5-325 MG tablet Take 1 tablet by mouth every 4 (four) hours as needed for severe pain. 30 tablet 0  . potassium chloride SA (K-DUR,KLOR-CON) 20 MEQ tablet Take 2 tablets (40 mEq total) by mouth 2 (two) times daily. 60 tablet 0  . spironolactone (ALDACTONE) 25 MG tablet Take 1 tablet (25 mg total) by mouth daily. 30 tablet 0  . amLODipine (NORVASC) 10 MG tablet Take 10 mg  by mouth daily.    . furosemide (LASIX) 40 MG tablet Take 1 tablet (40 mg total) by mouth daily. 60 tablet 0  . insulin glargine (LANTUS) 100 UNIT/ML injection Inject 0.25 mLs (25 Units total) into the skin at bedtime. 10 mL 1  . lidocaine-prilocaine (EMLA) cream Apply 1 application topically as needed (prior to accessing port).    [name] lisinopril (PRINIVIL,ZESTRIL) 40 MG tablet Take 40 mg by mouth daily.    . ondansetron (ZOFRAN) 8 MG tablet Take 1 tablet (8 mg total) by mouth every 8 (eight) hours as needed for nausea or vomiting. 90 tablet 0    Past Medical History  Diagnosis Date  . Stroke [facility])     a. 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  . Malignant hypertension     a. Admitted 06/2015 and 11/2015;  b. 06/2015 nl aldosterone/renin activity;  c. Polypharmacy.  [name] HLD (hyperlipidemia)   . Morbid obesity ([place])   . Anasarca     a. 11/2015 Echo: EF 55-65%, triv TR/MR.  [name] Hypokalemia   . Type II diabetes mellitus ([place])     a. 11/2015 A1c 9.5.  [name] CHF (congestive heart failure) ([place])   . Metastatic cancer ([place])     a. 11/2015 CT abd/pelvis: large R hepatic lobe mass extending inf to pararenal space on R. Complete occlusion of R portal vein. L hepatic lobe lesion. Bilat LL pulm mets. Skeletal mets 2/  Fx of R iliac wing. Enhancing uterine fundal mass.  [name] History of CVA (cerebrovascular accident)   . Left-sided weakness   . IDA (iron deficiency anemia)   . Insomnia   . Cancer [facility])     Past Surgical History  Procedure Laterality Date  . Cesarean section    . Peripheral vascular catheterization N/A 12/10/2015    Procedure: Glori Luis Cath Insertion;  Surgeon: Katha Cabal, MD;  Location: [facility];  Service: Cardiovascular;  Laterality: N/A;    Social History Social History  Substance Use Topics  . Smoking status: Never Smoker   . Smokeless tobacco: Never Used  . Alcohol Use: No     Comment: occasional    Family History Family History  Problem Relation Age of  Onset  . Heart disease Mother   . Hypertension Mother   . Diabetes Mother   . Diabetes Sister   . Hyperlipidemia Sister    no family history of porphyria, autoimmune disease or bleeding clotting disorders  Allergies  Allergen Reactions  . Penicillins Shortness Of Breath and Other (See Comments)    Has patient had a PCN reaction causing immediate rash, facial/tongue/throat swelling, SOB or lightheadedness with hypotension: Yes Has patient had a PCN reaction causing severe rash involving mucus membranes or skin necrosis: No Has patient had a PCN reaction that required hospitalization No Has patient had a PCN reaction occurring within the last 10 years: No If all of the above answers are "NO", then may proceed with Cephalosporin use.     REVIEW OF SYSTEMS (Negative unless checked)  Constitutional: '[]'$ Weight loss  '[]'$ Fever  '[]'$ Chills Cardiac: '[]'$ Chest pain   '[]'$ Chest pressure   '[]'$ Palpitations   '[]'$ Shortness of breath when laying flat   '[]'$ Shortness of breath at rest   '[]'$ Shortness of breath with exertion. Vascular:  '[]'$ Pain in legs with walking   '[]'$ Pain in legs at rest   '[]'$ Pain in legs when laying flat   '[]'$ Claudication   '[]'$ Pain in feet when walking  '[]'$ Pain in feet at rest  '[]'$ Pain in feet when laying flat   '[]'$ History of DVT   '[]'$ Phlebitis   '[]'$ Swelling in legs   '[]'$ Varicose veins   '[]'$ Non-healing ulcers Pulmonary:   '[]'$ Uses home oxygen   '[]'$ Productive cough   '[]'$ Hemoptysis   '[]'$ Wheeze  '[]'$ COPD   '[]'$ Asthma Neurologic:  '[]'$ Dizziness  '[]'$ Blackouts   '[]'$ Seizures   '[]'$ History of stroke   '[]'$ History of TIA  '[]'$ Aphasia   '[]'$ Temporary blindness   '[]'$ Dysphagia   '[]'$ Weakness or numbness in arms   '[]'$ Weakness or numbness in legs Musculoskeletal:  '[]'$ Arthritis   '[]'$ Joint swelling   '[]'$ Joint pain   '[]'$ Low back pain Hematologic:  '[]'$ Easy bruising  '[]'$ Easy bleeding   '[]'$ Hypercoagulable state   '[]'$ Anemic  '[]'$ Hepatitis Gastrointestinal:  '[]'$ Blood in stool   '[]'$ Vomiting blood  '[]'$ Gastroesophageal reflux/heartburn   '[]'$ Difficulty  swallowing. Genitourinary:  '[]'$ Chronic kidney disease   '[]'$ Difficult urination  '[]'$ Frequent urination  '[]'$ Burning with urination   '[]'$ Blood in urine Skin:  '[]'$ Rashes   '[]'$ Ulcers   '[]'$ Wounds Psychological:  '[]'$ History of anxiety   '[]'$  History of major depression.  Physical Examination  Filed Vitals:   12/10/15 1400 12/10/15 1415 12/10/15 1424 12/10/15 1430  BP: 156/78 165/80 165/80 162/90  Pulse: 72 71 76 83  Temp:      TempSrc:      Resp: '16 15 14 31  '$ SpO2: 96% 95% 97% 100%   There is no weight on file to calculate BMI. Gen: WD/WN, NAD Head: [place]/AT, No  temporalis wasting.  Ear/Nose/Throat: Hearing grossly intact, nares w/o erythema or drainage, oropharynx w/o Erythema/Exudate, Eyes: PERRLA, EOMI.  Neck: Supple, no nuchal rigidity.  No bruit or JVD.  Pulmonary:  Good air movement, clear to auscultation bilaterally, no increased work of respiration or use of accessory muscles  Cardiac: RRR, normal S1, S2, no Murmurs, rubs or gallops. Vascular:  Vessel Right Left  Radial Palpable Palpable  Ulnar Palpable Palpable  Brachial Palpable Palpable  Carotid Palpable, without bruit Palpable, without bruit  Aorta Not palpable N/A  Femoral Palpable Palpable  Popliteal Palpable Palpable  PT Palpable Palpable  DP Palpable Palpable   Gastrointestinal: soft, non-tender/non-distended. No guarding/reflex. No masses, surgical incisions, or scars. Musculoskeletal: M/S 5/5 throughout.  No deformity or atrophy. Neurologic: CN 2-12 intact. Pain and light touch intact in extremities.  Symmetrical.  Speech is fluent. Motor exam as listed above. Psychiatric: Judgment intact, Mood & affect appropriate for pt's clinical situation. Dermatologic: No rashes or ulcers noted.  No cellulitis or open wounds. Lymph : No Cervical, Axillary, or Inguinal lymphadenopathy.   CBC Lab Results  Component Value Date   WBC 8.8 12/16/2015   HGB 7.6* 12/16/2015   HCT 24.7* 12/16/2015   MCV 75.6* 12/16/2015   PLT 98*  12/16/2015    BMET    Component Value Date/Time   NA 140 12/18/2015 0535   NA 142 06/03/2014 0118   K 4.1 12/18/2015 0535   K 3.7 06/03/2014 0118   CL 96* 12/18/2015 0535   CL 105 06/03/2014 0118   CO2 36* 12/18/2015 0535   CO2 28 06/03/2014 0118   GLUCOSE 181* 12/18/2015 0535   GLUCOSE 85 06/03/2014 0118   BUN 29* 12/18/2015 0535   BUN 24* 06/03/2014 0118   CREATININE 0.78 12/18/2015 0535   CREATININE 1.87* 06/03/2014 0118   CALCIUM 7.9* 12/18/2015 0535   CALCIUM 8.7 06/03/2014 0118   GFRNONAA >60 12/18/2015 0535   GFRNONAA 29* 06/03/2014 0118   GFRAA >60 12/18/2015 0535   GFRAA 34* 06/03/2014 0118   Estimated Creatinine Clearance: 94.2 mL/min (by C-G formula based on Cr of 0.78).  COAG Lab Results  Component Value Date   INR 1.05 11/28/2015   INR 0.9 06/07/2012    Radiology Ct Chest W Contrast  12/14/2015  CLINICAL DATA:  Recently diagnosed with liver metastasis, poorly differentiated with neuroendocrine features. EXAM: CT CHEST WITH CONTRAST TECHNIQUE: Multidetector CT imaging of the chest was performed during intravenous contrast administration. CONTRAST:  48m OMNIPAQUE IOHEXOL 300 MG/ML  SOLN COMPARISON:  11/27/2015 abdominal CT.  Chest radiograph 11/26/2015. FINDINGS: Mediastinum/Nodes: A right-sided Port-A-Cath which terminates at the high right atrium. No supraclavicular adenopathy. Tortuous thoracic aorta. Moderate cardiomegaly, without pericardial effusion. No central pulmonary embolism, on this non-dedicated study. No mediastinal or hilar adenopathy. Lungs/Pleura: No pleural fluid. Minimal motion degradation. Innumerable bilateral pulmonary nodules, consistent with metastatic disease. Index right upper lobe nodule measures 1.0 cm on image 17/series 3. Index right lower lobe nodule measures 11 mm on image 35/series 3. Index left lower lobe pulmonary nodule measures 7 mm on image 34/series 3. Index left upper lobe nodule measures 12 mm on image 26/series 3. Upper  abdomen: Bilateral hepatic metastasis, as detailed previously. Index lateral segment left liver lobe lesion measures 3.4 cm on image 46/series 2. Increased from 2.3 cm on the prior. Normal imaged portions of the spleen, pancreas, left adrenal gland, and left kidney. Musculoskeletal: No acute osseous abnormality. IMPRESSION: 1. Extensive pulmonary metastasis. 2. Bilateral liver masses/metastasis. Index left liver lobe  lesion has enlarged significantly since 11/27/2015. Electronically Signed   By: Abigail Miyamoto M.D.   On: 12/14/2015 12:53   Ct Pelvis W Contrast  11/27/2015  CLINICAL DATA:  Hepatic mass discovered on ultrasound. Concern for hepatic malignancy. EXAM: CT ABDOMEN WITHOUT AND WITH CONTRAST CT pelvis with contrast. TECHNIQUE: Multidetector CT imaging of the abdomen was performed following the standard protocol before and following the bolus administration of intravenous contrast. CT the pelvis performed following IV contrast. CONTRAST:  161m OMNIPAQUE IOHEXOL 350 MG/ML SOLN COMPARISON:  Ultrasound 11/27/2015, radiograph 11/26/2015 FINDINGS: Lower chest: Multiple round pulmonary nodules at the lung bases of varying size consistent with lung metastasis. Example lesion in the RIGHT lower lobe measures 10 mm image 10, series 3. Example lesion LEFT lower lobe measures 11 mm on image 4, series 3. Hepatobiliary: Large RIGHT hepatic lobe mass occupying the near entirety of the posterior RIGHT hepatic lobe (segments 5, 6 and 7). Mass measures 12 cm x 12 cm in axial dimension and 17 cm in craniocaudad dimension. RIGHT hepatic lobe mass is partially exophytic and extends inferiorly and compresses the RIGHT kidney and extend into the space between the RIGHT kidney and the IVC. Several nodular extension this at this level (image 112, series 8). There is a smaller lesion in the LEFT hepatic lobe measuring 2.2 cm on image 48, series 8. The LEFT portal vein is patent. 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 Main portal  vein is patent. Pancreas: Pancreas is normal. No ductal dilatation. No pancreatic inflammation. Spleen: Normal spleen Adrenals/urinary tract: The the RIGHT adrenal gland is not identified secondary to the hepatic mass. The RIGHT kidney is depressed inferiorly. The LEFT kidney adrenal gland appear normal. Stomach/Bowel: Stomach, small bowel, appendix, and cecum are normal. The colon and rectosigmoid colon are normal. Vascular/Lymphatic: No clear lymphadenopathy. Nodule lesion between the IVC in the RIGHT kidney on image 112 is felt to be extension of the hepatic tumor itself. No pelvic lymphadenopathy. Reproductive: The fundus of the uterus has enhancing mass measuring 7.8 x 9.2 by 6.7 cm. No adnexal abnormality is obvious. Other: No free fluid. Musculoskeletal: There is an aggressive lesion along the anterior margin of the RIGHT iliac bone. This lesion or erupts through the cortex and measures 4.0 by 4.6 cm on image 164, series 8. The sclerosis of the SI joints. IMPRESSION: 1. Large RIGHT hepatic lobe mass which extends inferior to the pararenal space on the RIGHT. 2. 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 LEFT hepatic lobe lesion additionally. 3. Bilateral lower lobe pulmonary metastasis. 4. 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 5. Enhancing mass in the uterine fundus is likely a benign leiomyoma. Cannot exclude leiomyosarcoma given the extensive metastatic disease in no clear primary. 6. Differential considerations would include primary hepatocellular carcinoma versus metastatic disease from unknown primary. Recommend biopsying liver mass and FDG PET scan. Electronically Signed   By: SSuzy BouchardM.D.   On: 11/27/2015 14:21   Ct Abd Wo & W Cm  11/27/2015  CLINICAL DATA:  Hepatic mass discovered on ultrasound. Concern for hepatic malignancy. EXAM: CT ABDOMEN WITHOUT AND WITH CONTRAST CT pelvis with contrast. TECHNIQUE: Multidetector CT imaging of the abdomen was performed  following the standard protocol before and following the bolus administration of intravenous contrast. CT the pelvis performed following IV contrast. CONTRAST:  1034mOMNIPAQUE IOHEXOL 350 MG/ML SOLN COMPARISON:  Ultrasound 11/27/2015, radiograph 11/26/2015 FINDINGS: Lower chest: Multiple round pulmonary nodules at the lung bases of varying size consistent  with lung metastasis. Example lesion in the RIGHT lower lobe measures 10 mm image 10, series 3. Example lesion LEFT lower lobe measures 11 mm on image 4, series 3. Hepatobiliary: Large RIGHT hepatic lobe mass occupying the near entirety of the posterior RIGHT hepatic lobe (segments 5, 6 and 7). Mass measures 12 cm x 12 cm in axial dimension and 17 cm in craniocaudad dimension. RIGHT hepatic lobe mass is partially exophytic and extends inferiorly and compresses the RIGHT kidney and extend into the space between the RIGHT kidney and the IVC. Several nodular extension this at this level (image 112, series 8). There is a smaller lesion in the LEFT hepatic lobe measuring 2.2 cm on image 48, series 8. The LEFT portal vein is patent. 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 Main portal vein is patent. Pancreas: Pancreas is normal. No ductal dilatation. No pancreatic inflammation. Spleen: Normal spleen Adrenals/urinary tract: The the RIGHT adrenal gland is not identified secondary to the hepatic mass. The RIGHT kidney is depressed inferiorly. The LEFT kidney adrenal gland appear normal. Stomach/Bowel: Stomach, small bowel, appendix, and cecum are normal. The colon and rectosigmoid colon are normal. Vascular/Lymphatic: No clear lymphadenopathy. Nodule lesion between the IVC in the RIGHT kidney on image 112 is felt to be extension of the hepatic tumor itself. No pelvic lymphadenopathy. Reproductive: The fundus of the uterus has enhancing mass measuring 7.8 x 9.2 by 6.7 cm. No adnexal abnormality is obvious. Other: No free fluid. Musculoskeletal: There is an aggressive lesion  along the anterior margin of the RIGHT iliac bone. This lesion or erupts through the cortex and measures 4.0 by 4.6 cm on image 164, series 8. The sclerosis of the SI joints. IMPRESSION: 1. Large RIGHT hepatic lobe mass which extends inferior to the pararenal space on the RIGHT. 2. 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 LEFT hepatic lobe lesion additionally. 3. Bilateral lower lobe pulmonary metastasis. 4. 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 5. Enhancing mass in the uterine fundus is likely a benign leiomyoma. Cannot exclude leiomyosarcoma given the extensive metastatic disease in no clear primary. 6. Differential considerations would include primary hepatocellular carcinoma versus metastatic disease from unknown primary. Recommend biopsying liver mass and FDG PET scan. Electronically Signed   By: Suzy Bouchard M.D.   On: 11/27/2015 14:21   Ct Biopsy  11/29/2015  CLINICAL DATA:  Hepatic mass EXAM: CT GUIDED CORE BIOPSY OF HEPATIC MASS ANESTHESIA/SEDATION: NONE PROCEDURE: The procedure risks, benefits, and alternatives were explained to the patient. Questions regarding the procedure were encouraged and answered. The patient understands and consents to the procedure. The right anterior abdominal wall was prepped with chlorhexidinein a sterile fashion, and a sterile drape was applied covering the operative field. A sterile gown and sterile gloves were used for the procedure. Local anesthesia was provided with 1% Lidocaine. Utilizing CT fluoroscopic guidance a 17 gauge guiding needle was placed percutaneously into the anterior aspect of the right lobe of the liver. It was placed at the margin of the hypodense mass lesion posteriorly. Multiple 18 gauge core biopsies were then obtained. The guiding needle was then removed in Gel-Foam placed to aid in hemostasis. No post procedure hematoma is noted. The patient tolerated the procedure well and was returned to her room in satisfactory  condition. COMPLICATIONS: None immediate IMPRESSION: Successful CT-guided liver biopsy as described. Electronically Signed   By: Inez Catalina M.D.   On: 11/29/2015 13:53   Dg Chest Port 1 View  12/15/2015  CLINICAL  DATA:  Metastatic neuroendocrine tumor. EXAM: PORTABLE CHEST 1 VIEW COMPARISON:  Chest CT dated 12/14/2015 and chest x-ray dated 11/26/2015 FINDINGS: Power port in place. Multiple pulmonary metastases demonstrate significant progression since the prior chest x-ray. There is new pulmonary vascular congestion with slight atelectasis at the lung bases, possibly due to a shallow inspiration. IMPRESSION: New pulmonary vascular congestion since the prior CT scan and chest x-ray. Extensive pulmonary metastases have appreciably progressed since 11/26/2015. Electronically Signed   By: Lorriane Shire M.D.   On: 12/15/2015 19:34   Dg Chest Portable 1 View  11/26/2015  CLINICAL DATA:  60 year old with progressively worsening shortness of breath and bilateral upper extremity and lower extremity edema over the past few days. EXAM: PORTABLE CHEST 1 VIEW COMPARISON:  06/03/2014. FINDINGS: Marked elevation of the right hemidiaphragm with scar/atelectasis at the right lung base. Cardiac silhouette mildly to moderately enlarged. Pulmonary venous hypertension without overt edema. No visible pleural effusions. IMPRESSION: 1. Cardiomegaly. Pulmonary venous hypertension without overt edema currently. 2. Elevation of the right hemidiaphragm with scar/atelectasis at the right lung base. Electronically Signed   By: Evangeline Dakin M.D.   On: 11/26/2015 17:56   Dg Hand Complete Left  11/26/2015  CLINICAL DATA:  Fall onto left hand. Left hand swelling and pain. Initial encounter. EXAM: LEFT HAND - COMPLETE 3+ VIEW COMPARISON:  None. FINDINGS: There is no evidence of fracture or dislocation. Mild degenerative spurring is seen involving the metacarpophalangeal joint of the thumb. No other bone abnormality identified. Marked  soft tissue swelling is seen involving the dorsum of the hand and the wrist. IMPRESSION: Marked hand and wrist soft tissue swelling. No evidence of fracture or dislocation. Electronically Signed   By: Earle Gell M.D.   On: 11/26/2015 20:04   US Abdomen Limited Ruq  11/27/2015  CLINICAL DATA:  Elevated liver enzymes EXAM: US ABDOMEN LIMITED - RIGHT UPPER QUADRANT COMPARISON:  None. FINDINGS: Gallbladder: The gallbladder is predominantly contracted. No gallbladder wall thickening or pericholecystic fluid. Negative sonographic Murphy's sign per Common bile duct: Diameter: Normal at 4 mm. Liver: There is a large round heterogeneous solid-appearing mass within the RIGHT hepatic lobe measuring 18 by 11 by 12. This mass bulges the liver capsule. Additional smaller lesions noted in the LEFT hepatic lobe measuring up to 3.5 cm. No biliary duct dilatation. Mild heterogeneity of the liver parenchyma. IMPRESSION: 1. 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 Recommend CT of the abdomen and pelvis with contrast for further evaluation. 2. Contracted gallbladder without evidence of cholecystitis. These results will be called to the ordering clinician or representative by the Radiologist Assistant, and communication documented in the PACS or zVision Dashboard. Electronically Signed   By: Suzy Bouchard M.D.   On: 11/27/2015 09:35    Assessment/Plan 1.  Metastatic poorly differentiated carcinoma: Patient is undergoing chemotherapy and requires appropriate IV access. Infuse-a-Port will be placed. The risks and benefits of been reviewed and questions have been answered. She agrees to proceed with Infuse-a-Port layers.   Schnier, Dolores Lory, MD  12/22/2015 10:15 AM

## 2015-12-23 ENCOUNTER — Emergency Department: Payer: Medicaid Other

## 2015-12-23 ENCOUNTER — Inpatient Hospital Stay
Admission: EM | Admit: 2015-12-23 | Discharge: 2015-12-28 | DRG: 291 | Disposition: A | Discharge status: Skilled Nursing Facility | Payer: Medicaid Other | Attending: Internal Medicine | Admitting: Internal Medicine

## 2015-12-23 ENCOUNTER — Encounter: Payer: Self-pay | Admitting: Emergency Medicine

## 2015-12-23 DIAGNOSIS — Z9981 Dependence on supplemental oxygen: Secondary | ICD-10-CM

## 2015-12-23 DIAGNOSIS — E669 Obesity, unspecified: Secondary | ICD-10-CM | POA: Diagnosis not present

## 2015-12-23 DIAGNOSIS — R0603 Acute respiratory distress: Secondary | ICD-10-CM

## 2015-12-23 DIAGNOSIS — D696 Thrombocytopenia, unspecified: Secondary | ICD-10-CM | POA: Diagnosis present

## 2015-12-23 DIAGNOSIS — D3A8 Other benign neuroendocrine tumors: Secondary | ICD-10-CM | POA: Diagnosis present

## 2015-12-23 DIAGNOSIS — Z8249 Family history of ischemic heart disease and other diseases of the circulatory system: Secondary | ICD-10-CM

## 2015-12-23 DIAGNOSIS — Z880 Allergy status to penicillin: Secondary | ICD-10-CM | POA: Diagnosis not present

## 2015-12-23 DIAGNOSIS — Z9221 Personal history of antineoplastic chemotherapy: Secondary | ICD-10-CM

## 2015-12-23 DIAGNOSIS — R0600 Dyspnea, unspecified: Secondary | ICD-10-CM | POA: Diagnosis present

## 2015-12-23 DIAGNOSIS — C787 Secondary malignant neoplasm of liver and intrahepatic bile duct: Secondary | ICD-10-CM | POA: Diagnosis present

## 2015-12-23 DIAGNOSIS — I501 Left ventricular failure: Secondary | ICD-10-CM | POA: Diagnosis not present

## 2015-12-23 DIAGNOSIS — J9621 Acute and chronic respiratory failure with hypoxia: Secondary | ICD-10-CM | POA: Diagnosis present

## 2015-12-23 DIAGNOSIS — I509 Heart failure, unspecified: Secondary | ICD-10-CM

## 2015-12-23 DIAGNOSIS — I69354 Hemiplegia and hemiparesis following cerebral infarction affecting left non-dominant side: Secondary | ICD-10-CM | POA: Diagnosis not present

## 2015-12-23 DIAGNOSIS — Z79899 Other long term (current) drug therapy: Secondary | ICD-10-CM

## 2015-12-23 DIAGNOSIS — C3490 Malignant neoplasm of unspecified part of unspecified bronchus or lung: Secondary | ICD-10-CM | POA: Insufficient documentation

## 2015-12-23 DIAGNOSIS — Z6841 Body Mass Index (BMI) 40.0 and over, adult: Secondary | ICD-10-CM

## 2015-12-23 DIAGNOSIS — Z794 Long term (current) use of insulin: Secondary | ICD-10-CM | POA: Diagnosis not present

## 2015-12-23 DIAGNOSIS — Z9889 Other specified postprocedural states: Secondary | ICD-10-CM

## 2015-12-23 DIAGNOSIS — E876 Hypokalemia: Secondary | ICD-10-CM | POA: Diagnosis present

## 2015-12-23 DIAGNOSIS — Z833 Family history of diabetes mellitus: Secondary | ICD-10-CM | POA: Diagnosis not present

## 2015-12-23 DIAGNOSIS — C7800 Secondary malignant neoplasm of unspecified lung: Secondary | ICD-10-CM | POA: Diagnosis present

## 2015-12-23 DIAGNOSIS — D649 Anemia, unspecified: Secondary | ICD-10-CM | POA: Insufficient documentation

## 2015-12-23 DIAGNOSIS — I110 Hypertensive heart disease with heart failure: Secondary | ICD-10-CM | POA: Diagnosis present

## 2015-12-23 DIAGNOSIS — E119 Type 2 diabetes mellitus without complications: Secondary | ICD-10-CM | POA: Diagnosis present

## 2015-12-23 DIAGNOSIS — Z7982 Long term (current) use of aspirin: Secondary | ICD-10-CM | POA: Diagnosis not present

## 2015-12-23 DIAGNOSIS — G4733 Obstructive sleep apnea (adult) (pediatric): Secondary | ICD-10-CM | POA: Diagnosis present

## 2015-12-23 DIAGNOSIS — R0602 Shortness of breath: Secondary | ICD-10-CM

## 2015-12-23 DIAGNOSIS — I5033 Acute on chronic diastolic (congestive) heart failure: Secondary | ICD-10-CM | POA: Diagnosis present

## 2015-12-23 DIAGNOSIS — I10 Essential (primary) hypertension: Secondary | ICD-10-CM | POA: Insufficient documentation

## 2015-12-23 HISTORY — DX: Personal history of other medical treatment: Z92.89

## 2015-12-23 LAB — CBC WITH DIFFERENTIAL/PLATELET
BAND NEUTROPHILS: 0 %
BASOS ABS: 0 10*3/uL (ref 0–0.1)
BLASTS: 0 %
Basophils Relative: 0 %
EOS ABS: 0 10*3/uL (ref 0–0.7)
Eosinophils Relative: 0 %
HCT: 23.5 % — ABNORMAL LOW (ref 35.0–47.0)
Hemoglobin: 7.2 g/dL — ABNORMAL LOW (ref 12.0–16.0)
LYMPHS ABS: 0.4 10*3/uL — AB (ref 1.0–3.6)
LYMPHS PCT: 7 %
MCH: 23.1 pg — ABNORMAL LOW (ref 26.0–34.0)
MCHC: 30.7 g/dL — ABNORMAL LOW (ref 32.0–36.0)
MCV: 75 fL — AB (ref 80.0–100.0)
MONO ABS: 0.3 10*3/uL (ref 0.2–0.9)
MONOS PCT: 5 %
Metamyelocytes Relative: 0 %
Myelocytes: 0 %
Neutro Abs: 4.4 10*3/uL (ref 1.4–6.5)
Neutrophils Relative %: 86 %
OTHER: 2 %
PLATELETS: 66 10*3/uL — AB (ref 150–440)
Promyelocytes Absolute: 0 %
RBC: 3.13 MIL/uL — AB (ref 3.80–5.20)
RDW: 18.8 % — AB (ref 11.5–14.5)
WBC: 5.1 10*3/uL (ref 3.6–11.0)
nRBC: 9 /100 WBC — ABNORMAL HIGH

## 2015-12-23 LAB — URINALYSIS COMPLETE WITH MICROSCOPIC (ARMC ONLY)
BILIRUBIN URINE: NEGATIVE
Bacteria, UA: NONE SEEN
Glucose, UA: >500 mg/dL — AB
KETONES UR: NEGATIVE mg/dL
LEUKOCYTES UA: NEGATIVE
NITRITE: NEGATIVE
PH: 6 (ref 5.0–8.0)
PROTEIN: 30 mg/dL — AB
SPECIFIC GRAVITY, URINE: 1.014 (ref 1.005–1.030)

## 2015-12-23 LAB — COMPREHENSIVE METABOLIC PANEL
ALBUMIN: 2.9 g/dL — AB (ref 3.5–5.0)
ALK PHOS: 277 U/L — AB (ref 38–126)
ALT: 63 U/L — ABNORMAL HIGH (ref 14–54)
ANION GAP: 8 (ref 5–15)
AST: 52 U/L — AB (ref 15–41)
BUN: 26 mg/dL — AB (ref 6–20)
CALCIUM: 7.8 mg/dL — AB (ref 8.9–10.3)
CO2: 25 mmol/L (ref 22–32)
Chloride: 106 mmol/L (ref 101–111)
Creatinine, Ser: 0.81 mg/dL (ref 0.44–1.00)
GFR calc Af Amer: >60 mL/min (ref >60)
GFR calc non Af Amer: >60 mL/min (ref >60)
GLUCOSE: 227 mg/dL — AB (ref 65–99)
Potassium: 4.5 mmol/L (ref 3.5–5.1)
SODIUM: 139 mmol/L (ref 135–145)
Total Bilirubin: 0.6 mg/dL (ref 0.3–1.2)
Total Protein: 5.8 g/dL — ABNORMAL LOW (ref 6.5–8.1)

## 2015-12-23 LAB — TROPONIN I: Troponin I: <0.03 ng/mL (ref <0.031)

## 2015-12-23 LAB — LACTIC ACID, PLASMA: LACTIC ACID, VENOUS: 3.6 mmol/L — AB (ref 0.5–2.0)

## 2015-12-23 LAB — BRAIN NATRIURETIC PEPTIDE: B Natriuretic Peptide: 186 pg/mL — ABNORMAL HIGH (ref 0.0–100.0)

## 2015-12-23 MED ORDER — NITROGLYCERIN 2 % TD OINT
1.0000 [in_us] | TOPICAL_OINTMENT | Freq: Once | TRANSDERMAL | Status: AC
  Administered 2015-12-23: 1 [in_us] via TOPICAL
  Filled 2015-12-23: qty 1

## 2015-12-23 MED ORDER — FUROSEMIDE 10 MG/ML IJ SOLN
60.0000 mg | Freq: Once | INTRAMUSCULAR | Status: AC
  Administered 2015-12-23: 60 mg via INTRAVENOUS
  Filled 2015-12-23: qty 8

## 2015-12-23 NOTE — H&P (Signed)
[facility] [place] NAME: Renee Wells    MR#:  [id number]  DATE OF BIRTH:  October 29, 1956  DATE OF ADMISSION:  12/23/2015  PRIMARY CARE PHYSICIAN: Sharyne Peach, MD   REQUESTING/REFERRING PHYSICIAN: Dr Burlene Arnt  CHIEF COMPLAINT:   Increasing shortness of breath progressively over today HISTORY OF PRESENT ILLNESS:  Renee Wells  is a 60 y.o. 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 Patient was brought in by EMS on BiPAP. She received IV Lasix 60 mg and has urinated about 1500 cc. She is off BiPAP on nasal cannula oxygen sats are anywhere from 90-94% she is being admitted for recurrent acute pulmonary edema and malignant hypertension. Blood pressure on admission to the ER was 197/107   PAST MEDICAL HISTORY:   Past Medical History  Diagnosis Date  . Stroke [facility])     a. 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  . Malignant hypertension     a. Admitted 06/2015 and 11/2015;  b. 06/2015 nl aldosterone/renin activity;  c. Polypharmacy.  [name] HLD (hyperlipidemia)   . Morbid obesity ([place])   . Anasarca     a. 11/2015 Echo: EF 55-65%, triv TR/MR.  [name] Hypokalemia   . Type II diabetes mellitus ([place])     a. 11/2015 A1c 9.5.  [name] CHF (congestive heart failure) ([place])   . Metastatic cancer ([place])     a. 11/2015 CT abd/pelvis: large R hepatic lobe mass extending inf to pararenal space on R. Complete occlusion of R portal vein. L hepatic lobe lesion. Bilat LL pulm mets. Skeletal mets 2/ Fx of R iliac wing. Enhancing uterine fundal mass.  [name]  History of CVA (cerebrovascular accident)   . Left-sided weakness   . IDA (iron deficiency anemia)   . Insomnia   . Cancer [facility])     PAST SURGICAL HISTOIRY:   Past Surgical History  Procedure Laterality Date  . Cesarean section    . Peripheral vascular catheterization N/A 12/10/2015    Procedure: Glori Luis Cath Insertion;  Surgeon: Katha Cabal, MD;  Location: [place] [facility];  Service: Cardiovascular;  Laterality: N/A;    SOCIAL HISTORY:   Social History  Substance Use Topics  . Smoking status: Never Smoker   . Smokeless tobacco: Never Used  . Alcohol Use: No     Comment: occasional    FAMILY HISTORY:   Family History  Problem Relation Age of Onset  . Heart disease Mother   . Hypertension Mother   . Diabetes Mother   . Diabetes Sister   . Hyperlipidemia Sister     DRUG ALLERGIES:   Allergies  Allergen Reactions  . Penicillins Shortness Of Breath and Other (See Comments)    Has patient had a PCN reaction causing immediate rash, facial/tongue/throat swelling, SOB or lightheadedness with hypotension: Yes Has patient had a PCN reaction causing severe rash involving mucus membranes or skin necrosis: No Has patient had a PCN reaction that required hospitalization No Has patient had a PCN reaction occurring within the last 10 years: No If all of the above answers are "NO", then may proceed with Cephalosporin use.    REVIEW OF  SYSTEMS:  Review of Systems  Constitutional: Negative for fever, chills and weight loss.  HENT: Negative for ear discharge, ear pain and nosebleeds.   Eyes: Negative for blurred vision, pain and discharge.  Respiratory: Negative for sputum production, shortness of breath, wheezing and stridor.   Cardiovascular: Positive for chest pain, orthopnea and leg swelling. Negative for palpitations and PND.  Gastrointestinal: Negative for nausea, vomiting, abdominal pain and diarrhea.  Genitourinary: Negative for urgency and frequency.   Musculoskeletal: Negative for back pain and joint pain.  Neurological: Negative for sensory change, speech change, focal weakness and weakness.  Psychiatric/Behavioral: Negative for depression and hallucinations. The patient is not nervous/anxious.   All other systems reviewed and are negative.    MEDICATIONS AT HOME:   Prior to Admission medications   Medication Sig Start Date End Date Taking? Authorizing Provider  aspirin 81 MG chewable tablet Chew 1 tablet (81 mg total) by mouth daily. 12/03/15  Yes Loletha Grayer, MD  atorvastatin (LIPITOR) 20 MG tablet Take 20 mg by mouth at bedtime. Reported on 12/07/2015   Yes Historical Provider, MD  cloNIDine (CATAPRES) 0.3 MG tablet Take 1 tablet (0.3 mg total) by mouth 2 (two) times daily. 12/03/15  Yes Loletha Grayer, MD  furosemide (LASIX) 40 MG tablet Take 1 tablet (40 mg total) by mouth daily. 12/15/15  Yes Dmitriy Berenzon, MD  hydrALAZINE (APRESOLINE) 50 MG tablet Take 1 tablet (50 mg total) by mouth 3 (three) times daily. 12/03/15  Yes Richard Leslye Peer, MD  insulin aspart (NOVOLOG) 100 UNIT/ML injection Inject 16 Units into the skin 3 (three) times daily with meals. 12/03/15  Yes Richard Leslye Peer, MD  insulin glargine (LANTUS) 100 UNIT/ML injection Inject 0.25 mLs (25 Units total) into the skin at bedtime. 12/18/15  Yes Nicholes Mango, MD  labetalol (NORMODYNE) 300 MG tablet Take 1 tablet (300 mg total) by mouth 2 (two) times daily. 12/03/15  Yes Loletha Grayer, MD  magnesium oxide (MAG-OX) 400 (241.3 MG) MG tablet Take 1 tablet (400 mg total) by mouth daily. 12/04/15  Yes Loletha Grayer, MD  ondansetron (ZOFRAN) 8 MG tablet Take 1 tablet (8 mg total) by mouth every 8 (eight) hours as needed for nausea or vomiting. 12/15/15  Yes Dmitriy Berenzon, MD  oxyCODONE-acetaminophen (ROXICET) 5-325 MG tablet Take 1 tablet by mouth every 4 (four) hours as needed for severe pain. 12/03/15  Yes Loletha Grayer, MD  potassium chloride SA (K-DUR,KLOR-CON)  20 MEQ tablet Take 2 tablets (40 mEq total) by mouth 2 (two) times daily. 12/03/15  Yes Loletha Grayer, MD      VITAL SIGNS:  Blood pressure 179/94, pulse 90, resp. rate 26, SpO2 92 %.  PHYSICAL EXAMINATION:  GENERAL:  60 y.o.-year-old patient lying in the bed with  moderate acute distress. WBCs  EYES: Pupils equal, round, reactive to light and accommodation. No scleral icterus. Extraocular muscles intact.  HEENT: Head atraumatic, normocephalic. Oropharynx and nasopharynx clear.  NECK:  Supple, no jugular venous distention. No thyroid enlargement, no tenderness.  LUNGS: Distant  breath sounds bilaterally, no wheezing, rales,rhonchi or crepitation. No use of accessory muscles of respiration.  CARDIOVASCULAR: S1, S2 normal. No murmurs, rubs, or gallops.  ABDOMEN: Soft, nontender, nondistended. Bowel sounds present. No organomegaly or mass.  EXTREMITIES:+++ pedal edema, cyanosis, or clubbing.  NEUROLOGIC: Cranial nerves II through XII are intact. Muscle strength 5/5 in all extremities. Sensation intact. Gait not checked.  PSYCHIATRIC: The patient is alert and oriented x 3.  SKIN: No obvious rash, lesion, or ulcer.  LABORATORY PANEL:   CBC  Recent Labs Lab 12/23/15 2202  WBC 5.1  HGB 7.2*  HCT 23.5*  PLT 66*   ------------------------------------------------------------------------------------------------------------------  Chemistries   Recent Labs Lab 12/23/15 2202  NA 139  K 4.5  CL 106  CO2 25  GLUCOSE 227*  BUN 26*  CREATININE 0.81  CALCIUM 7.8*  AST 52*  ALT 63*  ALKPHOS 277*  BILITOT 0.6   ------------------------------------------------------------------------------------------------------------------  Cardiac Enzymes  Recent Labs Lab 12/23/15 2202  TROPONINI <0.03   ------------------------------------------------------------------------------------------------------------------  RADIOLOGY:  Dg Chest Port 1 View  12/23/2015  CLINICAL DATA:   Shortness of breath. EXAM: PORTABLE CHEST 1 VIEW COMPARISON:  Radiographs 12/15/2015, chest CT 12/14/2015 FINDINGS: Tip of the right chest port in the SVC. The heart is enlarged, stable. Progressive vascular congestion from prior. Development of pulmonary edema. Multifocal pulmonary metastasis, better seen on prior CT. No large pleural effusion or pneumothorax. IMPRESSION: Progressive vascular congestion. Development of pulmonary edema. Grossly stable cardiomegaly. Multiple pulmonary metastasis, better characterized on prior CT. Electronically Signed   By: Jeb Levering M.D.   On: 12/23/2015 22:36    EKG:  NSR  IMPRESSION AND PLAN:  Renee Wells  is a 60 y.o. 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 Patient was brought in by EMS on BiPAP.   1. Acute on chronic hypoxic respiratory failure secondary to acute flash pulmonary edema with underlying malignant hypertension -Admit to telemetry -IV Lasix 20 mg 3 times a day. Monitor I's and O's and creatinine changed to oral Lasix once hemodynamically stable. -Cardiology consultation -Cycle cardiac enzymes 3  2. Acute on chronic congestive heart failure diastolic in the setting of malignant hypertension -Echo done in December 2016 showed EF of 55-60% -Continue diuresis and heart meds -Cardiac consultation  3. Malignant hypertension -When necessary IV hydralazine -Continue clonidine, hydralazine, labetalol  4. Metastatic pulmonary nodules bilaterally secondary to poorly differentiated neuroendocrine tumor With metastases to the liver  as well -Continue breathing treatment and oxygen -Patient follows at the cancer center  5. Type 2 diabetes, insulin-requiring Continue home dose insulin Lantus and aspart along with sliding scale  Discussed with patient and family  All the records are reviewed and case discussed with ED provider. Management plans discussed with the patient, family and they are in agreement.  CODE STATUS: Full  TOTAL  critical TIME TAKING CARE OF THIS PATIENT: 50  minutes.    Per Beagley M.D on 12/23/2015 at 11:51 PM  Between 7am to 6pm - Pager - [phone]  After 6pm go to [email or web address] - password [email or web address] [facility]  Office  [phone]  CC: Primary care physician; Sharyne Peach, MD

## 2015-12-23 NOTE — Progress Notes (Signed)
Called by RN Kandee Keen to take pt of Bipap and try on [place]. Placed pt on 4L, SPO2 92%. Will continue to monitor.

## 2015-12-23 NOTE — ED Provider Notes (Addendum)
[facility] Emergency Department Provider Note  ____________________________________________   I have reviewed the triage vital signs and the nursing notes.   HISTORY  Chief Complaint Respiratory Distress    HPI Renee Wells is a 60 y.o. female unfortunately with a history of CHF, hypertension diabetes obesity metastatic cancer with apparently lung metastases, who presents today with acute shortness of breath over the course of the last day. She states her legs been gradually swelling over the last few days. History is somewhat limited by BiPAP. Patient went acute respiratory distress this evening. She feels that she has too much fluid on board. She denies chest pain. She denies nausea vomiting or fever. History is also from family and EMS. She does state that they recently went down on her Lasix pill.   Past Medical History  Diagnosis Date  . Stroke [facility])     a. 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  . Malignant hypertension     a. Admitted 06/2015 and 11/2015;  b. 06/2015 nl aldosterone/renin activity;  c. Polypharmacy.  [name] HLD (hyperlipidemia)   . Morbid obesity ([place])   . Anasarca     a. 11/2015 Echo: EF 55-65%, triv TR/MR.  [name] Hypokalemia   . Type II diabetes mellitus ([place])     a. 11/2015 A1c 9.5.  [name] CHF (congestive heart failure) ([place])   . Metastatic cancer ([place])     a. 11/2015 CT abd/pelvis: large R hepatic lobe mass extending inf to pararenal space on R. Complete occlusion of R portal vein. L hepatic lobe lesion. Bilat LL pulm mets. Skeletal mets 2/ Fx of R iliac wing. Enhancing uterine fundal mass.  [name] History of CVA (cerebrovascular accident)   . Left-sided weakness   . IDA (iron deficiency anemia)   . Insomnia   . Cancer [facility])     Patient Active Problem List   Diagnosis Date Noted  . Acute on chronic diastolic CHF (congestive heart failure), NYHA class 1 ([place]) 12/15/2015  . Acute on chronic systolic congestive heart failure ([place])   .  Acute diastolic CHF (congestive heart failure) ([place]) 11/30/2015  . Metastatic cancer ([place])   . Type II diabetes mellitus ([place])   . Anasarca   . Morbid obesity ([place])   . Malignant hypertension   . Dyspnea on exertion   . Liver mass   . Transaminitis   . Hyperglycemia 06/27/2015  . Obesity 06/27/2015  . Accelerated hypertension 06/26/2015  . Hypokalemia 06/26/2015  . HLD (hyperlipidemia) 06/26/2015  . Suspected CHF (congestive heart failure) 06/26/2015  . Bilateral lower extremity edema 06/26/2015    Past Surgical History  Procedure Laterality Date  . Cesarean section    . Peripheral vascular catheterization N/A 12/10/2015    Procedure: Glori Luis Cath Insertion;  Surgeon: Katha Cabal, MD;  Location: [place] [facility];  Service: Cardiovascular;  Laterality: N/A;    Current Outpatient Rx  Name  Route  Sig  Dispense  Refill  . amLODipine (NORVASC) 10 MG tablet   Oral   Take 10 mg by mouth daily.         [name] aspirin 81 MG chewable tablet   Oral   Chew 1 tablet (81 mg total) by mouth daily.         [name] atorvastatin (LIPITOR) 20 MG tablet   Oral   Take 20 mg by mouth at bedtime. Reported on 12/07/2015         . cloNIDine (CATAPRES) 0.3 MG tablet  Oral   Take 1 tablet (0.3 mg total) by mouth 2 (two) times daily.   60 tablet   0   . furosemide (LASIX) 40 MG tablet   Oral   Take 1 tablet (40 mg total) by mouth daily.   60 tablet   0   . hydrALAZINE (APRESOLINE) 50 MG tablet   Oral   Take 1 tablet (50 mg total) by mouth 3 (three) times daily.   90 tablet   0   . insulin aspart (NOVOLOG) 100 UNIT/ML injection   Subcutaneous   Inject 16 Units into the skin 3 (three) times daily with meals.   10 mL   1   . insulin glargine (LANTUS) 100 UNIT/ML injection   Subcutaneous   Inject 0.25 mLs (25 Units total) into the skin at bedtime.   10 mL   1   . irbesartan (AVAPRO) 75 MG tablet   Oral   Take 1 tablet (75 mg total) by mouth daily. Patient not taking:  Reported on 12/15/2015   30 tablet   0   . isosorbide mononitrate (IMDUR) 60 MG 24 hr tablet   Oral   Take 60 mg by mouth daily.         [name] labetalol (NORMODYNE) 300 MG tablet   Oral   Take 1 tablet (300 mg total) by mouth 2 (two) times daily.   60 tablet   0   . lidocaine-prilocaine (EMLA) cream   Topical   Apply 1 application topically as needed (prior to accessing port).         [name] lisinopril (PRINIVIL,ZESTRIL) 40 MG tablet   Oral   Take 40 mg by mouth daily.         . magnesium oxide (MAG-OX) 400 (241.3 MG) MG tablet   Oral   Take 1 tablet (400 mg total) by mouth daily.   30 tablet   0   . ondansetron (ZOFRAN) 8 MG tablet   Oral   Take 1 tablet (8 mg total) by mouth every 8 (eight) hours as needed for nausea or vomiting.   90 tablet   0   . oxyCODONE-acetaminophen (ROXICET) 5-325 MG tablet   Oral   Take 1 tablet by mouth every 4 (four) hours as needed for severe pain.   30 tablet   0   . potassium chloride SA (K-DUR,KLOR-CON) 20 MEQ tablet   Oral   Take 2 tablets (40 mEq total) by mouth 2 (two) times daily.   60 tablet   0   . spironolactone (ALDACTONE) 25 MG tablet   Oral   Take 1 tablet (25 mg total) by mouth daily.   30 tablet   0     Allergies Penicillins  Family History  Problem Relation Age of Onset  . Heart disease Mother   . Hypertension Mother   . Diabetes Mother   . Diabetes Sister   . Hyperlipidemia Sister     Social History Social History  Substance Use Topics  . Smoking status: Never Smoker   . Smokeless tobacco: Never Used  . Alcohol Use: No     Comment: occasional    Review of Systems Review of systems is somewhat limited by patient xstatusl: No fever/chills Eyes: No visual changes. ENT: No sore throat. No stiff neck no neck pain Cardiovascular: Denies chest pain. Respiratory:Positive for significant shortness  of breath. Gastrointestinal:   no vomiting.  No diarrhea.  No constipation. Genitourinary: Negative for  dysuria. Musculoskeletal:Positive for bilateral  lower extremity swelling  Skin: Negative for rash. Neurological: Negative for headaches, focal weakness or numbness. 10-point ROS otherwise negative.  ____________________________________________   PHYSICAL EXAM:  VITAL SIGNS: ED Triage Vitals  Enc Vitals Group     BP 12/23/15 2159 191/107 mmHg     Pulse Rate 12/23/15 2159 98     Resp 12/23/15 2159 14     Temp --      Temp src --      SpO2 12/23/15 2159 98 %     Weight --      Height --      Head Cir --      Peak Flow --      Pain Score --      Pain Loc --      Pain Edu? --      Excl. in [place]? --     Constitutional: Alert and oriented.And respiratory distress on BiPAP  Eyes: Conjunctivae are normal. PERRL. EOMI. Head: Atraumatic. Nose: No congestion/rhinnorhea. Mouth/Throat: Mucous membranes are moist.  Oropharynx non-erythematous. Neck: No stridor.   Nontender with no meningismus Cardiovascular: Normal rate, regular rhythm. Grossly normal heart sounds.  Good peripheral circulation. Respiratory:Patient with 2-3 word dyspnea initially, on BiPAP. Diffuse Rales noted. Sounds wet  Abdominal: Soft and nontender. No distention. No guarding no rebound Back:  There is no focal tenderness or step off there is no midline tenderness there are no lesions noted. there is no CVA tenderness Musculoskeletal: No lower extremity tenderness. No joint effusions, no DVT signs strong distal pulsessignificant bilateral pitting edema noted  Neurologic:  Normal speech and language. No gross focal neurologic deficits are appreciated.  Skin:  Skin is warm, dry and intact.Many bruised area noted to abdomen chest wall. Psychiatric: Mood and affect are normal. Speech and behavior are normal.  ____________________________________________   LABS (all labs ordered are listed, but only abnormal results are displayed)  Labs Reviewed  BLOOD GAS, VENOUS - Abnormal; Notable for the following:    pH, Ven  7.48 (*)    pCO2, Ven 38 (*)    Bicarbonate 28.3 (*)    Acid-Base Excess 4.6 (*)    All other components within normal limits  CULTURE, BLOOD (ROUTINE X 2)  CULTURE, BLOOD (ROUTINE X 2)  URINE CULTURE  COMPREHENSIVE METABOLIC PANEL  BRAIN NATRIURETIC PEPTIDE  TROPONIN I  LACTIC ACID, PLASMA  LACTIC ACID, PLASMA  CBC WITH DIFFERENTIAL/PLATELET  URINALYSIS COMPLETEWITH MICROSCOPIC ([facility] ONLY)  PROTIME-INR  APTT   ____________________________________________  EKG  I personally interpreted any EKGs ordered by me or triage Sinus rhythm rate 93 bpm no acute ST elevation or depression nonspecific ST changes, mildly limited by artifact  ____________________________________________  RADIOLOGY  I reviewed any imaging ordered by me or triage that were performed during my shift ____________________________________________   PROCEDURES  Procedure(s) performed: None  Critical Care performed: CRITICAL CARE Performed by: Schuyler Amor   Total critical care time: 45 minutes  Critical care time was exclusive of separately billable procedures and treating other patients.  Critical care was necessary to treat or prevent imminent or life-threatening deterioration.  Critical care was time spent personally by me on the following activities: development of treatment plan with patient and/or surrogate as well as nursing, discussions with consultants, evaluation of patient's response to treatment, examination of patient, obtaining history from patient or surrogate, ordering and performing treatments and interventions, ordering and review of laboratory studies, ordering and review of radiographic studies, pulse oximetry and re-evaluation of patient's condition.  ____________________________________________   INITIAL IMPRESSION / ASSESSMENT AND PLAN / ED COURSE  Pertinent labs & imaging results that were available during my care of the patient were reviewed by me and considered in my  medical decision making (see chart for details). She was significant respiratory distress, appears consistent with CHF. We are giving her IV Lasix empirically. Patient is improving rapidly on BiPAP. Chest x-ray is pending blood cultures are pending we will place a Foley catheter with some degree of her reluctance because patient will need to diuresis we will need to keep tract of ins and outs and we cannot have her get up on the bedpan and given her respiratory status. We will continue to monitor her closely here. Very concerning presentation.   ----------------------------------------- 11:27 PM on 12/23/2015 -----------------------------------------  She has diuresed 1 L of fluid and is feeling much better respiratory rate is much improved blood pressure is in the 170s and she would like to see if we can have a trial off of BiPAP which we will start. She is admitted to the hospitalist service. ____________________________________________   FINAL CLINICAL IMPRESSION(S) / ED DIAGNOSES  Final diagnoses:  SOB (shortness of breath)     Schuyler Amor, MD 12/23/15 2231  Schuyler Amor, MD 12/23/15 2328

## 2015-12-24 ENCOUNTER — Inpatient Hospital Stay: Payer: Medicaid Other

## 2015-12-24 ENCOUNTER — Encounter: Payer: Self-pay | Admitting: Physician Assistant

## 2015-12-24 DIAGNOSIS — D649 Anemia, unspecified: Secondary | ICD-10-CM

## 2015-12-24 DIAGNOSIS — C3490 Malignant neoplasm of unspecified part of unspecified bronchus or lung: Secondary | ICD-10-CM

## 2015-12-24 DIAGNOSIS — I5033 Acute on chronic diastolic (congestive) heart failure: Secondary | ICD-10-CM | POA: Insufficient documentation

## 2015-12-24 DIAGNOSIS — R0603 Acute respiratory distress: Secondary | ICD-10-CM | POA: Insufficient documentation

## 2015-12-24 DIAGNOSIS — R0600 Dyspnea, unspecified: Secondary | ICD-10-CM

## 2015-12-24 DIAGNOSIS — I10 Essential (primary) hypertension: Secondary | ICD-10-CM

## 2015-12-24 DIAGNOSIS — I501 Left ventricular failure: Secondary | ICD-10-CM

## 2015-12-24 LAB — GLUCOSE, CAPILLARY
GLUCOSE-CAPILLARY: 195 mg/dL — AB (ref 65–99)
Glucose-Capillary: 405 mg/dL — ABNORMAL HIGH (ref 65–99)
Glucose-Capillary: 202 mg/dL — ABNORMAL HIGH (ref 65–99)
Glucose-Capillary: 163 mg/dL — ABNORMAL HIGH (ref 65–99)

## 2015-12-24 LAB — TROPONIN I
Troponin I: 0.03 ng/mL (ref <0.031)
Troponin I: <0.03 ng/mL (ref <0.031)

## 2015-12-24 LAB — HEMOGLOBIN A1C: Hgb A1c MFr Bld: 9.5 % — ABNORMAL HIGH (ref 4.0–6.0)

## 2015-12-24 LAB — LACTIC ACID, PLASMA
LACTIC ACID, VENOUS: 3.5 mmol/L — AB (ref 0.5–2.0)
LACTIC ACID, VENOUS: 3.6 mmol/L — AB (ref 0.5–2.0)
LACTIC ACID, VENOUS: 3 mmol/L — AB (ref 0.5–2.0)

## 2015-12-24 LAB — ALBUMIN: ALBUMIN: 2.9 g/dL — AB (ref 3.5–5.0)

## 2015-12-24 LAB — PATHOLOGIST SMEAR REVIEW

## 2015-12-24 MED ORDER — INSULIN ASPART 100 UNIT/ML ~~LOC~~ SOLN
16.0000 [IU] | Freq: Three times a day (TID) | SUBCUTANEOUS | Status: DC
  Administered 2015-12-24 – 2015-12-28 (×13): 16 [IU] via SUBCUTANEOUS
  Filled 2015-12-24 (×12): qty 16

## 2015-12-24 MED ORDER — LABETALOL HCL 200 MG PO TABS
300.0000 mg | ORAL_TABLET | Freq: Two times a day (BID) | ORAL | Status: DC
  Administered 2015-12-24 – 2015-12-28 (×9): 300 mg via ORAL
  Filled 2015-12-24 (×5): qty 2
  Filled 2015-12-24: qty 1
  Filled 2015-12-24: qty 2
  Filled 2015-12-24: qty 1
  Filled 2015-12-24 (×2): qty 2

## 2015-12-24 MED ORDER — HYDRALAZINE HCL 50 MG PO TABS
50.0000 mg | ORAL_TABLET | Freq: Three times a day (TID) | ORAL | Status: DC
  Administered 2015-12-24: 50 mg via ORAL
  Filled 2015-12-24: qty 1

## 2015-12-24 MED ORDER — HYDRALAZINE HCL 20 MG/ML IJ SOLN
10.0000 mg | Freq: Four times a day (QID) | INTRAMUSCULAR | Status: DC | PRN
  Administered 2015-12-24 – 2015-12-25 (×3): 10 mg via INTRAVENOUS
  Filled 2015-12-24 (×3): qty 1

## 2015-12-24 MED ORDER — ASPIRIN 81 MG PO CHEW
81.0000 mg | CHEWABLE_TABLET | Freq: Every day | ORAL | Status: DC
  Administered 2015-12-24 – 2015-12-28 (×5): 81 mg via ORAL
  Filled 2015-12-24 (×6): qty 1

## 2015-12-24 MED ORDER — IPRATROPIUM-ALBUTEROL 0.5-2.5 (3) MG/3ML IN SOLN
3.0000 mL | Freq: Four times a day (QID) | RESPIRATORY_TRACT | Status: DC
  Administered 2015-12-24 – 2015-12-25 (×7): 3 mL via RESPIRATORY_TRACT
  Filled 2015-12-24 (×7): qty 3

## 2015-12-24 MED ORDER — ENOXAPARIN SODIUM 40 MG/0.4ML ~~LOC~~ SOLN: 40.0000 mg | SUBCUTANEOUS | Status: DC

## 2015-12-24 MED ORDER — OXYCODONE-ACETAMINOPHEN 5-325 MG PO TABS
1.0000 | ORAL_TABLET | ORAL | Status: DC | PRN
  Administered 2015-12-24 – 2015-12-27 (×5): 1 via ORAL
  Filled 2015-12-24 (×5): qty 1

## 2015-12-24 MED ORDER — ATORVASTATIN CALCIUM 20 MG PO TABS
20.0000 mg | ORAL_TABLET | Freq: Every day | ORAL | Status: DC
  Administered 2015-12-24 – 2015-12-27 (×4): 20 mg via ORAL
  Filled 2015-12-24 (×5): qty 1

## 2015-12-24 MED ORDER — ACETAMINOPHEN 650 MG RE SUPP: 650.0000 mg | Freq: Four times a day (QID) | RECTAL | Status: DC | PRN

## 2015-12-24 MED ORDER — MORPHINE SULFATE (PF) 2 MG/ML IV SOLN: 1.0000 mg | INTRAVENOUS | Status: DC | PRN

## 2015-12-24 MED ORDER — ONDANSETRON HCL 4 MG/2ML IJ SOLN: 4.0000 mg | Freq: Four times a day (QID) | INTRAMUSCULAR | Status: DC | PRN

## 2015-12-24 MED ORDER — CLONIDINE HCL 0.1 MG PO TABS
0.3000 mg | ORAL_TABLET | Freq: Two times a day (BID) | ORAL | Status: DC
  Administered 2015-12-24 – 2015-12-28 (×9): 0.3 mg via ORAL
  Filled 2015-12-24 (×9): qty 3

## 2015-12-24 MED ORDER — OXYCODONE-ACETAMINOPHEN 5-325 MG PO TABS
1.0000 | ORAL_TABLET | ORAL | Status: DC | PRN
  Administered 2015-12-24: 1 via ORAL
  Filled 2015-12-24: qty 1

## 2015-12-24 MED ORDER — ALBUTEROL SULFATE (2.5 MG/3ML) 0.083% IN NEBU
2.5000 mg | INHALATION_SOLUTION | Freq: Four times a day (QID) | RESPIRATORY_TRACT | Status: DC | PRN
  Administered 2015-12-24: 2.5 mg via RESPIRATORY_TRACT
  Filled 2015-12-24: qty 3

## 2015-12-24 MED ORDER — FUROSEMIDE 10 MG/ML IJ SOLN
20.0000 mg | Freq: Two times a day (BID) | INTRAMUSCULAR | Status: DC
  Administered 2015-12-24: 20 mg via INTRAVENOUS
  Filled 2015-12-24: qty 2

## 2015-12-24 MED ORDER — ACETAMINOPHEN 325 MG PO TABS: 650.0000 mg | ORAL_TABLET | Freq: Four times a day (QID) | ORAL | Status: DC | PRN

## 2015-12-24 MED ORDER — INSULIN GLARGINE 100 UNIT/ML ~~LOC~~ SOLN
25.0000 [IU] | Freq: Every day | SUBCUTANEOUS | Status: DC
  Administered 2015-12-24 – 2015-12-27 (×4): 25 [IU] via SUBCUTANEOUS
  Filled 2015-12-24 (×5): qty 0.25

## 2015-12-24 MED ORDER — MAGNESIUM OXIDE 400 (241.3 MG) MG PO TABS
400.0000 mg | ORAL_TABLET | Freq: Every day | ORAL | Status: DC
  Administered 2015-12-24 – 2015-12-28 (×5): 400 mg via ORAL
  Filled 2015-12-24 (×5): qty 1

## 2015-12-24 MED ORDER — POTASSIUM CHLORIDE CRYS ER 20 MEQ PO TBCR
40.0000 meq | EXTENDED_RELEASE_TABLET | Freq: Two times a day (BID) | ORAL | Status: DC
  Administered 2015-12-24 (×2): 40 meq via ORAL
  Filled 2015-12-24 (×2): qty 2

## 2015-12-24 MED ORDER — SENNOSIDES-DOCUSATE SODIUM 8.6-50 MG PO TABS: 1.0000 | ORAL_TABLET | Freq: Every evening | ORAL | Status: DC | PRN

## 2015-12-24 MED ORDER — ONDANSETRON HCL 4 MG PO TABS: 8.0000 mg | ORAL_TABLET | Freq: Three times a day (TID) | ORAL | Status: DC | PRN

## 2015-12-24 MED ORDER — INSULIN ASPART 100 UNIT/ML ~~LOC~~ SOLN
0.0000 [IU] | Freq: Three times a day (TID) | SUBCUTANEOUS | Status: DC
  Administered 2015-12-24: 2 [IU] via SUBCUTANEOUS
  Administered 2015-12-24: 5 [IU] via SUBCUTANEOUS
  Administered 2015-12-24: 15 [IU] via SUBCUTANEOUS
  Administered 2015-12-25 (×2): 2 [IU] via SUBCUTANEOUS
  Administered 2015-12-25 – 2015-12-26 (×2): 8 [IU] via SUBCUTANEOUS
  Administered 2015-12-26: 5 [IU] via SUBCUTANEOUS
  Administered 2015-12-26: 8 [IU] via SUBCUTANEOUS
  Administered 2015-12-27: 3 [IU] via SUBCUTANEOUS
  Administered 2015-12-27: 8 [IU] via SUBCUTANEOUS
  Administered 2015-12-27 – 2015-12-28 (×2): 3 [IU] via SUBCUTANEOUS
  Filled 2015-12-24: qty 3
  Filled 2015-12-24 (×2): qty 8
  Filled 2015-12-24: qty 2
  Filled 2015-12-24: qty 8
  Filled 2015-12-24: qty 2
  Filled 2015-12-24: qty 3
  Filled 2015-12-24: qty 2
  Filled 2015-12-24: qty 3
  Filled 2015-12-24: qty 15
  Filled 2015-12-24: qty 8
  Filled 2015-12-24 (×2): qty 5

## 2015-12-24 MED ORDER — ONDANSETRON HCL 4 MG PO TABS: 4.0000 mg | ORAL_TABLET | Freq: Four times a day (QID) | ORAL | Status: DC | PRN

## 2015-12-24 MED ORDER — HYDRALAZINE HCL 50 MG PO TABS
100.0000 mg | ORAL_TABLET | Freq: Three times a day (TID) | ORAL | Status: DC
  Administered 2015-12-24 (×2): 100 mg via ORAL
  Filled 2015-12-24 (×2): qty 2

## 2015-12-24 NOTE — ED Notes (Signed)
Lab notified to add Hemoglobin A1C, spoke with Denyse Amass, states will add.

## 2015-12-24 NOTE — Progress Notes (Signed)
[facility] NAME: Renee Wells    MR#:  [id number]  DATE OF BIRTH:  06/19/1956  SUBJECTIVE:  CHIEF COMPLAINT:  Patient's shortness of breath is slightly better while resting but reports exertional dyspnea. Denies any chest pain.  REVIEW OF SYSTEMS:  CONSTITUTIONAL: No fever, fatigue or weakness.  EYES: No blurred or double vision.  EARS, NOSE, AND THROAT: No tinnitus or ear pain.  RESPIRATORY: No cough, reporting exertional shortness of breath, denies wheezing or hemoptysis.  CARDIOVASCULAR: No chest pain, orthopnea, edema.  GASTROINTESTINAL: No nausea, vomiting, diarrhea or abdominal pain.  GENITOURINARY: No dysuria, hematuria.  ENDOCRINE: No polyuria, nocturia,  HEMATOLOGY: No anemia, easy bruising or bleeding SKIN: No rash or lesion. MUSCULOSKELETAL: No joint pain or arthritis.   NEUROLOGIC: No tingling, numbness, weakness.  PSYCHIATRY: No anxiety or depression.   DRUG ALLERGIES:   Allergies  Allergen Reactions  . Penicillins Shortness Of Breath and Other (See Comments)    Has patient had a PCN reaction causing immediate rash, facial/tongue/throat swelling, SOB or lightheadedness with hypotension: Yes Has patient had a PCN reaction causing severe rash involving mucus membranes or skin necrosis: No Has patient had a PCN reaction that required hospitalization No Has patient had a PCN reaction occurring within the last 10 years: No If all of the above answers are "NO", then may proceed with Cephalosporin use.    VITALS:  Blood pressure 176/88, pulse 103, temperature 98.2 F (36.8 C), temperature source Oral, resp. rate 20, weight 122.108 kg (269 lb 3.2 oz), SpO2 94 %.  PHYSICAL EXAMINATION:  GENERAL:  60 y.o.-year-old patient lying in the bed with no acute distress.  EYES: Pupils equal, round, reactive to light and accommodation. No scleral icterus. Extraocular muscles intact.  HEENT: Head atraumatic,  normocephalic. Oropharynx and nasopharynx clear.  NECK:  Supple, no jugular venous distention. No thyroid enlargement, no tenderness.  LUNGS: Diminished breath sounds at lower lung fields bilaterally, no wheezing, rales, positive rhonchi , no crepitation. No use of accessory muscles of respiration.  CARDIOVASCULAR: S1, S2 normal. No murmurs, rubs, or gallops.  ABDOMEN: Soft, nontender, nondistended. Bowel sounds present. No organomegaly or mass.  EXTREMITIES: Has 3+ pitting pedal edema,  no cyanosis or clubbing NEUROLOGIC: Cranial nerves II through XII are intact. Muscle strength 5/5 in all extremities. Sensation intact. Gait not checked.  PSYCHIATRIC: The patient is alert and oriented x 3.  SKIN: No obvious rash, lesion, or ulcer.    LABORATORY PANEL:   CBC  Recent Labs Lab 12/23/15 2202  WBC 5.1  HGB 7.2*  HCT 23.5*  PLT 66*   ------------------------------------------------------------------------------------------------------------------  Chemistries   Recent Labs Lab 12/23/15 2202  NA 139  K 4.5  CL 106  CO2 25  GLUCOSE 227*  BUN 26*  CREATININE 0.81  CALCIUM 7.8*  AST 52*  ALT 63*  ALKPHOS 277*  BILITOT 0.6   ------------------------------------------------------------------------------------------------------------------  Cardiac Enzymes  Recent Labs Lab 12/24/15 0836  TROPONINI <0.03   ------------------------------------------------------------------------------------------------------------------  RADIOLOGY:  Dg Chest Port 1 View  12/23/2015  CLINICAL DATA:  Shortness of breath. EXAM: PORTABLE CHEST 1 VIEW COMPARISON:  Radiographs 12/15/2015, chest CT 12/14/2015 FINDINGS: Tip of the right chest port in the SVC. The heart is enlarged, stable. Progressive vascular congestion from prior. Development of pulmonary edema. Multifocal pulmonary metastasis, better seen on prior CT. No large pleural effusion or pneumothorax. IMPRESSION: Progressive vascular  congestion. Development of pulmonary edema. Grossly stable cardiomegaly. Multiple pulmonary metastasis, better  characterized on prior CT. Electronically Signed   By: Jeb Levering M.D.   On: 12/23/2015 22:36    EKG:   Orders placed or performed during the hospital encounter of 12/23/15  . ED EKG  . ED EKG  . EKG 12-Lead  . EKG 12-Lead    ASSESSMENT AND PLAN:   Renee Wells is a 60 y.o. 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 Patient was brought in by EMS on BiPAP.   1. Acute on chronic hypoxic respiratory failure secondary to multiple etiologies acute flash pulmonary edema with underlying malignant hypertension, anemia, obstructive sleep apnea and underlying lung metastases -Continue telemetry monitoring  -IV Lasix 20 mg 3 times a day. Monitor I's and O's and consider changing  to oral Lasix once hemodynamically stable. - appreciate cardiology Dr. Donivan Scull recommendations  Acute MI ruled out with negative cardiac enzymes We will provide blood transfusion as needed , check CBC in a.m.  -CPAP daily at bedtime    2. Acute on chronic congestive heart failure diastolic in the setting of malignant hypertension -Echo done in December 2016 showed EF of 55-60% -Continue diuresis and heart meds -Cardiac consultation  3. Malignant hypertension -When necessary IV hydralazine -Continue clonidine, hydralazine dose is increased to 100 mg by mouth every 8 hours , labetalol; titrate as needed   4. 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 Type 2 diabetes, insulin-requiring Continue home dose insulin Lantus and aspart along with sliding scale  Discussed with patient and family  Patient with poor prognosis , palliative care consult is placed   All the records are reviewed and case discussed with ED provider. Management plans discussed with the patient, family and they are in agreement.     All the records are reviewed and case discussed with Care Management/Social Workerr. Management plans discussed with the patient, family and they are in agreement.  CODE STATUS: Full code  TOTAL TIME TAKING CARE OF THIS PATIENT: 35 minutes.   POSSIBLE D/C IN 3-4 days , DEPENDING ON CLINICAL CONDITION.   Nicholes Mango M.D on 12/24/2015 at 2:09 PM  Between 7am to 6pm - Pager - [phone] After 6pm go to [email or web address] - password [email or web address] [facility]  Office  [phone]  CC: Primary care physician; Sharyne Peach, MD

## 2015-12-24 NOTE — Progress Notes (Signed)
Palliative Care Update  I have reviewed chart and discussed pt with Care Mgr.  I will complete the consult on Monday.   Colleen Can, MD

## 2015-12-24 NOTE — Progress Notes (Signed)
Pt passed quiet day. Having some mild abd /hip pain relieved with oxycodone.remains generally edematous. Foley draining well. Good diuresis with 20 lasix bid. portacath accessed without difficulty. Peripheral line leaking and removed. fsbs required coverage each meal. Remains in nsr.

## 2015-12-24 NOTE — Care Management (Signed)
Fourth admission for same sx since Dec 2016.  Spoke with [place] from [facility] to discuss how is being managed in the home.  Patient on a previous discharge had not allowed the agency to visit.  Nursing and physical therapy have seen the patient. Tele health has been set up.Have requested palliative care consult due to the frequent admisison

## 2015-12-24 NOTE — Progress Notes (Addendum)
Inpatient Diabetes Program Recommendations  AACE/ADA: New Consensus Statement on Inpatient Glycemic Control (2015)  Target Ranges:  Prepandial:   less than 140 mg/dL      Peak postprandial:   less than 180 mg/dL (1-2 hours)      Critically ill patients:  140 - 180 mg/dL   Review of Glycemic Control  Results for Renee Wells, Renee Wells (MRN [id number]) as of 12/24/2015 08:36  Ref. Range 12/24/2015 07:24  Glucose-Capillary Latest Ref Range: 65-99 mg/dL 195 (H)    Diabetes history: Type 2, A1C 9.5% on 12/23/15 Outpatient Diabetes medications: Novolog 16 units tid, Lantus 25 units qhs Current orders for Inpatient glycemic control: Lantus 25 units qhs, Novolog 16 units tid with meals, Novolog 0-15 units tid  Inpatient Diabetes Program Recommendations: Agree with current orders  if the patient is eating well- otherwise consider Novolog 8 units tid and Novolog correction 0-9 units tid.  I will continue to follow and make recommendations.  Gentry Fitz, RN, BA, MHA, CDE Diabetes Coordinator Inpatient Diabetes Program  [phone] (Team Pager) [phone] ([place]) 12/24/2015 8:50 AM

## 2015-12-24 NOTE — Progress Notes (Signed)
Inpatient Diabetes Program Recommendations  AACE/ADA: New Consensus Statement on Inpatient Glycemic Control (2015)  Target Ranges:  Prepandial:   less than 140 mg/dL      Peak postprandial:   less than 180 mg/dL (1-2 hours)      Critically ill patients:  140 - 180 mg/dL   Results for MARLEI, GLOMSKI (MRN [id number]) as of 12/24/2015 12:43  Ref. Range 12/24/2015 07:24 12/24/2015 11:55  Glucose-Capillary Latest Ref Range: 65-99 mg/dL 195 (H) 405 (H)    Diabetes history: Type 2, A1C 9.5% on 12/23/15 Outpatient Diabetes medications: Novolog 16 units tid, Lantus 25 units qhs Current orders for Inpatient glycemic control: Lantus 25 units qhs, Novolog 16 units tid with meals, Novolog 0-15 units tid  Inpatient Diabetes Program Recommendations: Noted elevated blood sugar at noon- agree with current orders in diabetes management.  Gentry Fitz, RN, BA, MHA, CDE Diabetes Coordinator Inpatient Diabetes Program  [phone] (Team Pager) [phone] ([place]) 12/24/2015 3:22 PM

## 2015-12-24 NOTE — Consult Note (Signed)
Cardiology Consultation Note  Patient ID: Renee Wells, MRN: [id number], DOB/AGE: 05/15/1956 60 y.o. Admit date: 12/23/2015   Date of Consult: 12/24/2015 Primary Physician: Sharyne Peach, MD Primary Cardiologist: New to [facility]  Chief Complaint: SOB Reason for Consult: Acute on chronic diastolic CHF  HPI: 60 y.o. 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 Cardiology is consulted for assistance.    At her most recent discharge on 12/31 she was diuresed on IV Lasix 40 mg and discharged on 20 mg bid. Apparently, patient reports this was changed at some point to 20 mg daily by oncology 2/2 hypokalemia, though it appears this was changed weeks ago. She has continued to take Lasix 20 mg daily since her discharge on 12/31. Unfortunately, she did not decrease the amount of PO fluid consumption. She has continued to drink large amounts of fluid as she feels like she needs to move the medications through her. On 1/5 she slowly began to develop increased SOB with cough. This has happened to her previously and self resolved, however this time her symptoms persisted prompting her to come to the ED for evaluation. She has had long standing LEE, left worse than right 2/2 her prior stroke.   Upon the patient's arrival to [facility] they were found to have a BNP of 186, troponin negative x 2, lactic acid 3.5-->3.6, pH 7.48, pCo2 of 38, . ECG as below, CXR showed progressive  vascular congestion, development of pulmonary edema, multiple pulmonary mets, BP was in the 409W systolic (she reports home BP in the 1-teens). She was started on IV Lasix.    Past Medical History  Diagnosis Date  . Stroke [facility])     a. 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  . Malignant hypertension     a. Admitted 06/2015 and 11/2015;  b. 06/2015 nl aldosterone/renin activity;  c. Polypharmacy.  [name] HLD (hyperlipidemia)   . Morbid obesity ([place])   . Anasarca     a. 11/2015 Echo: EF 55-65%, triv TR/MR.  [name] Hypokalemia   . Type II diabetes mellitus ([place])     a. 11/2015 A1c 9.5.  [name] CHF (congestive heart failure) ([place])   . Metastatic cancer ([place])     a. 11/2015 CT abd/pelvis: large R hepatic lobe mass extending inf to pararenal space on R. Complete occlusion of R portal vein. L hepatic lobe lesion. Bilat LL pulm mets. Skeletal mets 2/ Fx of R iliac wing. Enhancing uterine fundal mass.  [name] History of CVA (cerebrovascular accident)   . Left-sided weakness   . IDA (iron deficiency anemia)   . Insomnia   . Cancer [facility])       Most Recent Cardiac Studies: Echo 06/2015: Study Conclusions - Left ventricle: Wall thickness was increased in a pattern of moderate LVH. Systolic function was normal. The estimated ejection fraction was in the range of 60% to 65%. - Aortic valve: Valve area (Vmax): 1.98 cm^2. - Right ventricle: The cavity size was mildly dilated.  Echo 11/2015: Study Conclusions - Left  ventricle: Systolic function was normal. The estimated ejection fraction was in the range of 55% to 65%.   Surgical History:  Past Surgical History  Procedure Laterality Date  . Cesarean section    . Peripheral vascular catheterization N/A 12/10/2015    Procedure: Glori Luis Cath Insertion;  Surgeon: Katha Cabal, MD;  Location: [facility];  Service: Cardiovascular;  Laterality: N/A;     Home Meds: Prior to Admission medications   Medication Sig Start Date End Date Taking?  Authorizing Provider  aspirin 81 MG chewable tablet Chew 1 tablet (81 mg total) by mouth daily. 12/03/15  Yes Loletha Grayer, MD  atorvastatin (LIPITOR) 20 MG tablet Take 20 mg by mouth at bedtime. Reported on 12/07/2015   Yes Historical Provider, MD  cloNIDine (CATAPRES) 0.3 MG tablet Take 1 tablet (0.3 mg total) by mouth 2 (two) times daily. 12/03/15  Yes Loletha Grayer, MD  furosemide (LASIX) 40 MG tablet Take 1 tablet (40 mg total) by mouth daily. 12/15/15  Yes Dmitriy Berenzon, MD  hydrALAZINE (APRESOLINE) 50 MG tablet Take 1 tablet (50 mg total) by mouth 3 (three) times daily. 12/03/15  Yes Richard Leslye Peer, MD  insulin aspart (NOVOLOG) 100 UNIT/ML injection Inject 16 Units into the skin 3 (three) times daily with meals. 12/03/15  Yes Richard Leslye Peer, MD  insulin glargine (LANTUS) 100 UNIT/ML injection Inject 0.25 mLs (25 Units total) into the skin at bedtime. 12/18/15  Yes Nicholes Mango, MD  labetalol (NORMODYNE) 300 MG tablet Take 1 tablet (300 mg total) by mouth 2 (two) times daily. 12/03/15  Yes Loletha Grayer, MD  magnesium oxide (MAG-OX) 400 (241.3 MG) MG tablet Take 1 tablet (400 mg total) by mouth daily. 12/04/15  Yes Loletha Grayer, MD  ondansetron (ZOFRAN) 8 MG tablet Take 1 tablet (8 mg total) by mouth every 8 (eight) hours as needed for nausea or vomiting. 12/15/15  Yes Dmitriy Berenzon, MD  oxyCODONE-acetaminophen (ROXICET) 5-325 MG tablet Take 1 tablet by mouth every 4 (four) hours as needed for severe pain. 12/03/15  Yes Loletha Grayer, MD  potassium chloride SA (K-DUR,KLOR-CON) 20 MEQ tablet Take 2 tablets (40 mEq total) by mouth 2 (two) times daily. 12/03/15  Yes Loletha Grayer, MD    Inpatient Medications:  . aspirin  81 mg Oral Daily  . atorvastatin  20 mg Oral QHS  . cloNIDine  0.3 mg Oral BID  . hydrALAZINE  50 mg Oral TID  . insulin aspart  0-15 Units Subcutaneous TID WC  . insulin aspart  16 Units Subcutaneous TID WC  . insulin glargine  25 Units Subcutaneous QHS   . ipratropium-albuterol  3 mL Nebulization Q6H  . labetalol  300 mg Oral BID  . magnesium oxide  400 mg Oral Daily  . potassium chloride SA  40 mEq Oral BID      Allergies:  Allergies  Allergen Reactions  . Penicillins Shortness Of Breath and Other (See Comments)    Has patient had a PCN reaction causing immediate rash, facial/tongue/throat swelling, SOB or lightheadedness with hypotension: Yes Has patient had a PCN reaction causing severe rash involving mucus membranes or skin necrosis: No Has patient had a PCN reaction that required hospitalization No Has patient had a PCN reaction occurring within the last 10 years: No If all of the above answers are "NO", then may proceed with Cephalosporin use.    Social History   Social History  . Marital Status: Single    Spouse Name: N/A  . Number of  Children: N/A  . Years of Education: N/A   Occupational History  . Not on file.   Social History Main Topics  . Smoking status: Never Smoker   . Smokeless tobacco: Never Used  . Alcohol Use: No     Comment: occasional  . Drug Use: No  . Sexual Activity: No   Other Topics Concern  . Not on file   Social History Narrative   Lives in [place] with son and his fiancee.  Up until a few wks ago, was able to get around with cane, then moved to [place], and more recently w/c 2/2 progressive dyspnea and lower ext edema.     Family History  Problem Relation Age of Onset  . Heart disease Mother   . Hypertension Mother   . Diabetes Mother   . Diabetes Sister   . Hyperlipidemia Sister      Review of Systems: Review of Systems  Constitutional: Positive for malaise/fatigue. Negative for fever, chills, weight loss and diaphoresis.  HENT: Negative for congestion.   Eyes: Negative for discharge and redness.  Respiratory: Positive for cough, shortness of breath and wheezing. Negative for hemoptysis and sputum production.   Cardiovascular: Positive for orthopnea, leg swelling and PND.  Negative for chest pain, palpitations and claudication.  Gastrointestinal: Negative for nausea, vomiting and abdominal pain.  Musculoskeletal: Negative for falls.  Skin: Negative for rash.  Neurological: Positive for dizziness and weakness. Negative for tingling, tremors, sensory change, speech change, focal weakness, seizures and loss of consciousness.  Endo/Heme/Allergies: Bruises/bleeds easily.  Psychiatric/Behavioral: Negative for substance abuse. The patient is not nervous/anxious.   All other systems reviewed and are negative.    Labs:  Recent Labs  12/23/15 2202 12/24/15 0429  TROPONINI <0.03 0.03   Lab Results  Component Value Date   WBC 5.1 12/23/2015   HGB 7.2* 12/23/2015   HCT 23.5* 12/23/2015   MCV 75.0* 12/23/2015   PLT 66* 12/23/2015    Recent Labs Lab 12/23/15 2202  NA 139  K 4.5  CL 106  CO2 25  BUN 26*  CREATININE 0.81  CALCIUM 7.8*  PROT 5.8*  BILITOT 0.6  ALKPHOS 277*  ALT 63*  AST 52*  GLUCOSE 227*   Lab Results  Component Value Date   CHOL 194 06/07/2012   HDL 87* 06/07/2012   LDLCALC 94 06/07/2012   TRIG 67 06/07/2012   No results found for: DDIMER  Radiology/Studies:  Ct Chest W Contrast  12/14/2015  CLINICAL DATA:  Recently diagnosed with liver metastasis, poorly differentiated with neuroendocrine features. EXAM: CT CHEST WITH CONTRAST TECHNIQUE: Multidetector CT imaging of the chest was performed during intravenous contrast administration. CONTRAST:  65m OMNIPAQUE IOHEXOL 300 MG/ML  SOLN COMPARISON:  11/27/2015 abdominal CT.  Chest radiograph 11/26/2015. FINDINGS: Mediastinum/Nodes: A right-sided Port-A-Cath which terminates at the high right atrium. No supraclavicular adenopathy. Tortuous thoracic aorta. Moderate cardiomegaly, without pericardial effusion. No central pulmonary embolism, on this non-dedicated study. No mediastinal or hilar adenopathy. Lungs/Pleura: No pleural fluid. Minimal motion degradation. Innumerable bilateral  pulmonary nodules, consistent with metastatic disease. Index right upper lobe nodule measures 1.0 cm on image 17/series 3. Index right lower lobe nodule measures 11 mm on image 35/series 3. Index left lower lobe pulmonary nodule measures 7 mm on image 34/series 3. Index left upper lobe nodule measures 12 mm on image 26/series 3. Upper abdomen: Bilateral hepatic metastasis, as detailed previously. Index lateral segment left liver lobe lesion measures 3.4 cm on image 46/series 2. Increased from 2.3  cm on the prior. Normal imaged portions of the spleen, pancreas, left adrenal gland, and left kidney. Musculoskeletal: No acute osseous abnormality. IMPRESSION: 1. Extensive pulmonary metastasis. 2. Bilateral liver masses/metastasis. Index left liver lobe lesion has enlarged significantly since 11/27/2015. Electronically Signed   By: Abigail Miyamoto M.D.   On: 12/14/2015 12:53   Ct Pelvis W Contrast  11/27/2015  CLINICAL DATA:  Hepatic mass discovered on ultrasound. Concern for hepatic malignancy. EXAM: CT ABDOMEN WITHOUT AND WITH CONTRAST CT pelvis with contrast. TECHNIQUE: Multidetector CT imaging of the abdomen was performed following the standard protocol before and following the bolus administration of intravenous contrast. CT the pelvis performed following IV contrast. CONTRAST:  178m OMNIPAQUE IOHEXOL 350 MG/ML SOLN COMPARISON:  Ultrasound 11/27/2015, radiograph 11/26/2015 FINDINGS: Lower chest: Multiple round pulmonary nodules at the lung bases of varying size consistent with lung metastasis. Example lesion in the RIGHT lower lobe measures 10 mm image 10, series 3. Example lesion LEFT lower lobe measures 11 mm on image 4, series 3. Hepatobiliary: Large RIGHT hepatic lobe mass occupying the near entirety of the posterior RIGHT hepatic lobe (segments 5, 6 and 7). Mass measures 12 cm x 12 cm in axial dimension and 17 cm in craniocaudad dimension. RIGHT hepatic lobe mass is partially exophytic and extends inferiorly  and compresses the RIGHT kidney and extend into the space between the RIGHT kidney and the IVC. Several nodular extension this at this level (image 112, series 8). There is a smaller lesion in the LEFT hepatic lobe measuring 2.2 cm on image 48, series 8. The LEFT portal vein is patent. 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 Main portal vein is patent. Pancreas: Pancreas is normal. No ductal dilatation. No pancreatic inflammation. Spleen: Normal spleen Adrenals/urinary tract: The the RIGHT adrenal gland is not identified secondary to the hepatic mass. The RIGHT kidney is depressed inferiorly. The LEFT kidney adrenal gland appear normal. Stomach/Bowel: Stomach, small bowel, appendix, and cecum are normal. The colon and rectosigmoid colon are normal. Vascular/Lymphatic: No clear lymphadenopathy. Nodule lesion between the IVC in the RIGHT kidney on image 112 is felt to be extension of the hepatic tumor itself. No pelvic lymphadenopathy. Reproductive: The fundus of the uterus has enhancing mass measuring 7.8 x 9.2 by 6.7 cm. No adnexal abnormality is obvious. Other: No free fluid. Musculoskeletal: There is an aggressive lesion along the anterior margin of the RIGHT iliac bone. This lesion or erupts through the cortex and measures 4.0 by 4.6 cm on image 164, series 8. The sclerosis of the SI joints. IMPRESSION: 1. Large RIGHT hepatic lobe mass which extends inferior to the pararenal space on the RIGHT. 2. 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 LEFT hepatic lobe lesion additionally. 3. Bilateral lower lobe pulmonary metastasis. 4. 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 5. Enhancing mass in the uterine fundus is likely a benign leiomyoma. Cannot exclude leiomyosarcoma given the extensive metastatic disease in no clear primary. 6. Differential considerations would include primary hepatocellular carcinoma versus metastatic disease from unknown primary. Recommend biopsying liver mass and FDG PET scan.  Electronically Signed   By: SSuzy BouchardM.D.   On: 11/27/2015 14:21   Ct Abd Wo & W Cm  11/27/2015  CLINICAL DATA:  Hepatic mass discovered on ultrasound. Concern for hepatic malignancy. EXAM: CT ABDOMEN WITHOUT AND WITH CONTRAST CT pelvis with contrast. TECHNIQUE: Multidetector CT imaging of the abdomen was performed following the standard protocol before and following the bolus administration of intravenous contrast. CT  the pelvis performed following IV contrast. CONTRAST:  174m OMNIPAQUE IOHEXOL 350 MG/ML SOLN COMPARISON:  Ultrasound 11/27/2015, radiograph 11/26/2015 FINDINGS: Lower chest: Multiple round pulmonary nodules at the lung bases of varying size consistent with lung metastasis. Example lesion in the RIGHT lower lobe measures 10 mm image 10, series 3. Example lesion LEFT lower lobe measures 11 mm on image 4, series 3. Hepatobiliary: Large RIGHT hepatic lobe mass occupying the near entirety of the posterior RIGHT hepatic lobe (segments 5, 6 and 7). Mass measures 12 cm x 12 cm in axial dimension and 17 cm in craniocaudad dimension. RIGHT hepatic lobe mass is partially exophytic and extends inferiorly and compresses the RIGHT kidney and extend into the space between the RIGHT kidney and the IVC. Several nodular extension this at this level (image 112, series 8). There is a smaller lesion in the LEFT hepatic lobe measuring 2.2 cm on image 48, series 8. The LEFT portal vein is patent. 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 Main portal vein is patent. Pancreas: Pancreas is normal. No ductal dilatation. No pancreatic inflammation. Spleen: Normal spleen Adrenals/urinary tract: The the RIGHT adrenal gland is not identified secondary to the hepatic mass. The RIGHT kidney is depressed inferiorly. The LEFT kidney adrenal gland appear normal. Stomach/Bowel: Stomach, small bowel, appendix, and cecum are normal. The colon and rectosigmoid colon are normal. Vascular/Lymphatic: No clear lymphadenopathy. Nodule  lesion between the IVC in the RIGHT kidney on image 112 is felt to be extension of the hepatic tumor itself. No pelvic lymphadenopathy. Reproductive: The fundus of the uterus has enhancing mass measuring 7.8 x 9.2 by 6.7 cm. No adnexal abnormality is obvious. Other: No free fluid. Musculoskeletal: There is an aggressive lesion along the anterior margin of the RIGHT iliac bone. This lesion or erupts through the cortex and measures 4.0 by 4.6 cm on image 164, series 8. The sclerosis of the SI joints. IMPRESSION: 1. Large RIGHT hepatic lobe mass which extends inferior to the pararenal space on the RIGHT. 2. 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 LEFT hepatic lobe lesion additionally. 3. Bilateral lower lobe pulmonary metastasis. 4. 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 5. Enhancing mass in the uterine fundus is likely a benign leiomyoma. Cannot exclude leiomyosarcoma given the extensive metastatic disease in no clear primary. 6. Differential considerations would include primary hepatocellular carcinoma versus metastatic disease from unknown primary. Recommend biopsying liver mass and FDG PET scan. Electronically Signed   By: SSuzy BouchardM.D.   On: 11/27/2015 14:21   Ct Biopsy  11/29/2015  CLINICAL DATA:  Hepatic mass EXAM: CT GUIDED CORE BIOPSY OF HEPATIC MASS ANESTHESIA/SEDATION: NONE PROCEDURE: The procedure risks, benefits, and alternatives were explained to the patient. Questions regarding the procedure were encouraged and answered. The patient understands and consents to the procedure. The right anterior abdominal wall was prepped with chlorhexidinein a sterile fashion, and a sterile drape was applied covering the operative field. A sterile gown and sterile gloves were used for the procedure. Local anesthesia was provided with 1% Lidocaine. Utilizing CT fluoroscopic guidance a 17 gauge guiding needle was placed percutaneously into the anterior aspect of the right lobe of the  liver. It was placed at the margin of the hypodense mass lesion posteriorly. Multiple 18 gauge core biopsies were then obtained. The guiding needle was then removed in Gel-Foam placed to aid in hemostasis. No post procedure hematoma is noted. The patient tolerated the procedure well and was returned to her room in satisfactory condition.  COMPLICATIONS: None immediate IMPRESSION: Successful CT-guided liver biopsy as described. Electronically Signed   By: Inez Catalina M.D.   On: 11/29/2015 13:53   Dg Chest Port 1 View  12/23/2015  CLINICAL DATA:  Shortness of breath. EXAM: PORTABLE CHEST 1 VIEW COMPARISON:  Radiographs 12/15/2015, chest CT 12/14/2015 FINDINGS: Tip of the right chest port in the SVC. The heart is enlarged, stable. Progressive vascular congestion from prior. Development of pulmonary edema. Multifocal pulmonary metastasis, better seen on prior CT. No large pleural effusion or pneumothorax. IMPRESSION: Progressive vascular congestion. Development of pulmonary edema. Grossly stable cardiomegaly. Multiple pulmonary metastasis, better characterized on prior CT. Electronically Signed   By: Jeb Levering M.D.   On: 12/23/2015 22:36   Dg Chest Port 1 View  12/15/2015  CLINICAL DATA:  Metastatic neuroendocrine tumor. EXAM: PORTABLE CHEST 1 VIEW COMPARISON:  Chest CT dated 12/14/2015 and chest x-ray dated 11/26/2015 FINDINGS: Power port in place. Multiple pulmonary metastases demonstrate significant progression since the prior chest x-ray. There is new pulmonary vascular congestion with slight atelectasis at the lung bases, possibly due to a shallow inspiration. IMPRESSION: New pulmonary vascular congestion since the prior CT scan and chest x-ray. Extensive pulmonary metastases have appreciably progressed since 11/26/2015. Electronically Signed   By: Lorriane Shire M.D.   On: 12/15/2015 19:34   Dg Chest Portable 1 View  11/26/2015  CLINICAL DATA:  60 year old with progressively worsening shortness of  breath and bilateral upper extremity and lower extremity edema over the past few days. EXAM: PORTABLE CHEST 1 VIEW COMPARISON:  06/03/2014. FINDINGS: Marked elevation of the right hemidiaphragm with scar/atelectasis at the right lung base. Cardiac silhouette mildly to moderately enlarged. Pulmonary venous hypertension without overt edema. No visible pleural effusions. IMPRESSION: 1. Cardiomegaly. Pulmonary venous hypertension without overt edema currently. 2. Elevation of the right hemidiaphragm with scar/atelectasis at the right lung base. Electronically Signed   By: Evangeline Dakin M.D.   On: 11/26/2015 17:56   Dg Hand Complete Left  11/26/2015  CLINICAL DATA:  Fall onto left hand. Left hand swelling and pain. Initial encounter. EXAM: LEFT HAND - COMPLETE 3+ VIEW COMPARISON:  None. FINDINGS: There is no evidence of fracture or dislocation. Mild degenerative spurring is seen involving the metacarpophalangeal joint of the thumb. No other bone abnormality identified. Marked soft tissue swelling is seen involving the dorsum of the hand and the wrist. IMPRESSION: Marked hand and wrist soft tissue swelling. No evidence of fracture or dislocation. Electronically Signed   By: Earle Gell M.D.   On: 11/26/2015 20:04   US Abdomen Limited Ruq  11/27/2015  CLINICAL DATA:  Elevated liver enzymes EXAM: US ABDOMEN LIMITED - RIGHT UPPER QUADRANT COMPARISON:  None. FINDINGS: Gallbladder: The gallbladder is predominantly contracted. No gallbladder wall thickening or pericholecystic fluid. Negative sonographic Murphy's sign per Common bile duct: Diameter: Normal at 4 mm. Liver: There is a large round heterogeneous solid-appearing mass within the RIGHT hepatic lobe measuring 18 by 11 by 12. This mass bulges the liver capsule. Additional smaller lesions noted in the LEFT hepatic lobe measuring up to 3.5 cm. No biliary duct dilatation. Mild heterogeneity of the liver parenchyma. IMPRESSION: 1. Large mass within the RIGHT  hepatic lobe and smaller lesions in the LEFT hepatic lobe are concerning for PRIMARY HEPATIC CARCINOMA. Recommend CT of the abdomen and pelvis with contrast for further evaluation. 2. Contracted gallbladder without evidence of cholecystitis. These results will be called to the ordering clinician or representative by the Radiologist Assistant, and communication documented  in the PACS or zVision Dashboard. Electronically Signed   By: Suzy Bouchard M.D.   On: 11/27/2015 09:35    EKG: NSR, 93 bpm, LVH, TWI III  Weights: Filed Weights   12/24/15 0243  Weight: 269 lb 3.2 oz (122.108 kg)     Physical Exam: Blood pressure 192/91, pulse 98, temperature 98.2 F (36.8 C), temperature source Oral, resp. rate 19, weight 269 lb 3.2 oz (122.108 kg), SpO2 93 %. Body mass index is 46.19 kg/(m^2). General: Well developed, well nourished, in no acute distress. Head: Normocephalic, atraumatic, sclera non-icteric, no xanthomas, nares are without discharge.  Neck: Negative for carotid bruits. JVD not elevated. Lungs: Decreased breath sounds with coarse breath sounds. Breathing is unlabored. Heart: RRR with S1 S2. No murmurs, rubs, or gallops appreciated. Abdomen: Obese, soft, non-tender, non-distended with normoactive bowel sounds. No hepatomegaly. No rebound/guarding. No obvious abdominal masses. Msk:  Strength and tone appear normal for age. Extremities: No clubbing or cyanosis. No edema.  Distal pedal pulses are 2+ and equal bilaterally. Neuro: Alert and oriented X 3. No facial asymmetry. No focal deficit. Moves all extremities spontaneously. Psych:  Responds to questions appropriately with a normal affect.    Assessment and Plan:   1. Recurrent pulmonary edema likely secondary to pulmonary metastatic carcinoma: -Patient slowly began to develop symptoms -Likely in the setting of her pulmonary cancer as above and her malignant HTN -Fundamentally she has normal LV systolic function and wall motion on  echo -Recommend IM/Oncology evaluation -Could use diuretics for SOB for aid in diuresis, though this is not definitive therapy  -Albumin of 2.9 on 1/5. Suspect this is playing a role as well -Her chronic anemia that is worsening is likely contributing, see below -Question compliance vs enough diuretics as her Lasix was decreased 2/2 hypokalemia rather than adding potassium repletion leading to volume overload given she drinks large amounts of fluids daily -Consider adding spironolactone in the future if indicated, her potassium appears to be stable in her most recent readings. Perhaps she just got behind the curve leading to the decreasing of her Lasix several weeks ago -Decrease fluid consumption    2. Acute on chronic respiratory failure: -On oxygen therapy -Wean back to baseline as able -IV Lasix as above  3. Acute on chronic diastolic CHF: -BP 856D during this admission -Needs optimal BP control, she reports readings in the 1-teens at home -Neither of her echo readings have made mention of diastolic function parameters or right heart pressures, though review of studies it did appear she had grossly elevated right heart pressures -Continue aggressive diuresis with IV Lasix  -KCl repletion -On labetalol currently  3. Malignant HTN: -Improved with most recent reading in the 140s per patient -Labetalol, hydralazine, clonidine, IV Lasix  -Her lower extremity edema precludes addition of CCB at this time  4. 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 IDDM: -Per IM  6. Chronic anemia: -Likely contributing to the above -Would need to correct this in an effort to aid in resolving her edema -Her anemia continues to slowly trend down -Recommend hgb >8 to 8.5, transfuse if needed  7. Obesity and possible OHS: -Weight loss advised -Needs outpatient sleep study   Melvern Banker, PA-C Pager: [phone] 12/24/2015, 7:54 AM

## 2015-12-24 NOTE — Care Management (Signed)
Follow up with [place] from [facility].  Patient was discharge from [facility] on 12/31.  Agency did not receive the referral until 1/1 as was informed "faxes were not going through at [facility] on 12/31.  Agency made visit on 1/2.  Tele health has not been set up yet because this is done on the second nursing visit.  Discussed that agency knew patient was inpatient and to please call weekend CM to follow up on discharges. Also discussed that agency has access to epic and can follow orders and patient progress during hospitalization.  Discussed that need to follow patient closely and make every effort to prevent readmisison

## 2015-12-25 LAB — CBC
HEMATOCRIT: 23.2 % — AB (ref 35.0–47.0)
Hemoglobin: 6.9 g/dL — ABNORMAL LOW (ref 12.0–16.0)
MCH: 22.2 pg — AB (ref 26.0–34.0)
MCHC: 30 g/dL — ABNORMAL LOW (ref 32.0–36.0)
MCV: 74.1 fL — AB (ref 80.0–100.0)
PLATELETS: 54 10*3/uL — AB (ref 150–440)
RBC: 3.13 MIL/uL — ABNORMAL LOW (ref 3.80–5.20)
RDW: 18.1 % — AB (ref 11.5–14.5)
WBC: 3.6 10*3/uL (ref 3.6–11.0)

## 2015-12-25 LAB — URINE CULTURE: Culture: NO GROWTH

## 2015-12-25 LAB — BASIC METABOLIC PANEL
ANION GAP: 11 (ref 5–15)
BUN: 22 mg/dL — AB (ref 6–20)
CALCIUM: 7.6 mg/dL — AB (ref 8.9–10.3)
CO2: 30 mmol/L (ref 22–32)
Chloride: 102 mmol/L (ref 101–111)
Creatinine, Ser: 0.69 mg/dL (ref 0.44–1.00)
GFR calc Af Amer: >60 mL/min (ref >60)
GLUCOSE: 150 mg/dL — AB (ref 65–99)
POTASSIUM: 3.1 mmol/L — AB (ref 3.5–5.1)
Sodium: 143 mmol/L (ref 135–145)

## 2015-12-25 LAB — GLUCOSE, CAPILLARY
GLUCOSE-CAPILLARY: 125 mg/dL — AB (ref 65–99)
GLUCOSE-CAPILLARY: 226 mg/dL — AB (ref 65–99)
Glucose-Capillary: 290 mg/dL — ABNORMAL HIGH (ref 65–99)
Glucose-Capillary: 254 mg/dL — ABNORMAL HIGH (ref 65–99)

## 2015-12-25 LAB — PREPARE RBC (CROSSMATCH)

## 2015-12-25 LAB — ABO/RH: ABO/RH(D): O POS

## 2015-12-25 MED ORDER — ISOSORBIDE MONONITRATE ER 30 MG PO TB24
30.0000 mg | ORAL_TABLET | Freq: Every day | ORAL | Status: DC
  Administered 2015-12-25 – 2015-12-28 (×4): 30 mg via ORAL
  Filled 2015-12-25 (×4): qty 1

## 2015-12-25 MED ORDER — SODIUM CHLORIDE 0.9 % IV SOLN: Freq: Once | INTRAVENOUS | Status: DC

## 2015-12-25 MED ORDER — POTASSIUM CHLORIDE CRYS ER 20 MEQ PO TBCR
40.0000 meq | EXTENDED_RELEASE_TABLET | Freq: Three times a day (TID) | ORAL | Status: DC
  Administered 2015-12-25 – 2015-12-28 (×10): 40 meq via ORAL
  Filled 2015-12-25 (×11): qty 2

## 2015-12-25 MED ORDER — TORSEMIDE 20 MG PO TABS
40.0000 mg | ORAL_TABLET | Freq: Two times a day (BID) | ORAL | Status: DC
  Administered 2015-12-25 – 2015-12-28 (×6): 40 mg via ORAL
  Filled 2015-12-25 (×7): qty 2

## 2015-12-25 MED ORDER — HYDRALAZINE HCL 50 MG PO TABS
100.0000 mg | ORAL_TABLET | Freq: Four times a day (QID) | ORAL | Status: DC
  Administered 2015-12-25 – 2015-12-28 (×12): 100 mg via ORAL
  Filled 2015-12-25 (×12): qty 2

## 2015-12-25 NOTE — Progress Notes (Addendum)
Pt. B/P 191/82 right arm lying, '10mg'$  hydralazine given IV per EMAR.  Pt. Alert and oriented with no acute distress or SOB noted. Pt. Is asymptomatic at this time. Will continue to monitor pt.

## 2015-12-25 NOTE — Progress Notes (Signed)
Pt's BP was 184/94, '10mg'$  of hydralazine push given per staging orders. Pt. Asymptomatic. No signs or c/o pain noted. No SOB or acute distress noted. Will re-evaulate in an hour. Will continue to monitor pt.

## 2015-12-25 NOTE — Progress Notes (Addendum)
While assessing blood transfusion (at 2130)  for completion it was observed huber needle had de-accessed from port a cath to right upper chest. Infusion stopped. New access site searched for unable to access new site. Another nurse asked to attempt from unit, she also was unable to access a site. Prime called,  ER called for assistance. Prime was in a code, [place] from ER came after code and were able to start a 22g in right A/C, IV flushes with blood return. Dr. Jannifer Franklin returned call, he was informed of the situation and that the blood was not completed within the 4 hour time frame  R/T the de-access of the port-a-cath and multiple attempts to re-acces a site.  He was also informed we were able to find a new IV site. He ordered to complete the infusion and collect a lab draw 30 minutes  after infusion complete. Infusion was completed, pt. Tolerated infusion well. Will continue to monitor pt.   Port a cath site cleaned, no bleeding noted, dry dressing applied.  Hematoma noted from 10-2 o'clock on port a cath. Will continue to monitor pt.

## 2015-12-25 NOTE — Evaluation (Signed)
Physical Therapy Evaluation Patient Details Name: Renee Wells MRN: [id number] DOB: 1956/02/09 Today's Date: 12/25/2015   History of Present Illness  60 yo female with pulmonary edema with CHF, chronic O2 use at 2L at home, has old L side weakness from stroke 3 years ago.  PMHx:  liver CA with new pulmonary mets, on chemotherapy, HTN, CVA, CHF  Clinical Impression  Pt shows good effort and motivation t/o session and despite being fatigued was eager to try to get to standing.  She fatigued significantly with just getting to sitting and needed considerable assist with all mobility and especially with standing.  Pt would rather go home, but realizes that she is too weak and will likely benefit much more from a stint in rehab.     Follow Up Recommendations SNF    Equipment Recommendations       Recommendations for Other Services       Precautions / Restrictions Precautions Precautions: Fall Restrictions Weight Bearing Restrictions: No      Mobility  Bed Mobility Overal bed mobility: Needs Assistance Bed Mobility: Supine to Sit;Sit to Supine     Supine to sit: Min assist;HOB elevated;Mod assist Sit to supine: Mod assist   General bed mobility comments: Pt reports that her son does help her with bed mobility at baseline, appears to need more than her normal level of assist at this time  Transfers Overall transfer level: Needs assistance Equipment used: Rolling Stage (2 wheeled);1 person hand held assist Transfers: Sit to/from Stand Sit to Stand: Max assist;Mod assist         General transfer comment: Pt very labored with getting to standing using Creed, does not use L side effectively and needs heavy assist to attain upright.   Ambulation/Gait             General Gait Details: Pt unable to ambulate, makes good effort trying to move R LE, unable to move L, O2 drops to 84% with minimal activity  Stairs            Wheelchair Mobility    Modified Rankin  (Stroke Patients Only)       Balance                                             Pertinent Vitals/Pain Pain Assessment: No/denies pain    Home Living Family/patient expects to be discharged to:: Skilled nursing facility (pt would like to go home but realizes she is too weak)                      Prior Function Level of Independence: Needs assistance   Gait / Transfers Assistance Needed: used [place], was limited to short distances     Comments: Pt reports she has been able to do less and less needing more assist from son (sometimes he would have to help her advance her L LE, etc)     Hand Dominance   Dominant Hand: Right    Extremity/Trunk Assessment   Upper Extremity Assessment: LUE deficits/detail (L)       LUE Deficits / Details: decreased LUE overhead reach due to weakness from stroke. Able to initiate some movement, but minimal.  Little drip strength for coordination   Lower Extremity Assessment: LLE deficits/detail RLE Deficits / Details: intact light touch sensation; gross strength is 4/5 throughout LE; ROM  is WFL LLE Deficits / Details: intact light touch sensation; decreased AROM especially in hip and knee; able to initiate slight ankle ROM; gross strength is 2/5 throughout LE;      Communication   Communication: No difficulties  Cognition Arousal/Alertness: Awake/alert Behavior During Therapy: WFL for tasks assessed/performed Overall Cognitive Status: Within Functional Limits for tasks assessed                      General Comments      Exercises        Assessment/Plan    PT Assessment Patient needs continued PT services  PT Diagnosis Difficulty walking;Abnormality of gait;Generalized weakness   PT Problem List Decreased strength;Decreased range of motion;Decreased safety awareness;Decreased activity tolerance;Decreased balance;Decreased mobility;Cardiopulmonary status limiting activity;Obesity  PT Treatment  Interventions DME instruction;Gait training;Stair training;Functional mobility training;Therapeutic activities;Therapeutic exercise;Balance training;Neuromuscular re-education;Patient/family education   PT Goals (Current goals can be found in the Care Plan section) Acute Rehab PT Goals Patient Stated Goal: pt would rather go home PT Goal Formulation: With patient Time For Goal Achievement: 01/08/16 Potential to Achieve Goals: Fair    Frequency Min 2X/week   Barriers to discharge  (flight of steps to enter )      Co-evaluation               End of Session Equipment Utilized During Treatment: Oxygen Activity Tolerance: Patient limited by fatigue Patient left: with bed alarm set;with call bell/phone within reach           Time: 1341-1400 PT Time Calculation (min) (ACUTE ONLY): 19 min   Charges:   PT Evaluation $PT Eval Moderate Complexity: 1 Procedure     PT G Codes:       Wayne Both, PT, DPT [phone]  Kreg Shropshire 12/25/2015, 5:44 PM

## 2015-12-25 NOTE — Progress Notes (Signed)
Patient has declined bipap for the night

## 2015-12-25 NOTE — Progress Notes (Signed)
Pt refusing Bipap.

## 2015-12-25 NOTE — Progress Notes (Signed)
Blood transfusion continues through port a cath to right upper chest. No signs of acute distress noted. No SOB. No signs or c/o pain noted. Will continue to monitor patient.

## 2015-12-25 NOTE — Progress Notes (Signed)
Pt is alert and oriented. Feeling somewhat tired. Appetite has been excellent. Foley removed and pt is voiding w/o difficulty. hgb 6.9. Pt is to be transfused with one unit rbc's. Consent for tx done.  Family at bedside. Tele st. Afebrile. bp improved 148/68

## 2015-12-25 NOTE — Progress Notes (Signed)
[place] [facility] [place] NAME: Renee Wells    MR#:  [id number]  DATE OF BIRTH:  1956/03/24  SUBJECTIVE:  She is a lot better. Urine output since admission 8.2 L. Sats 95% on 4 L nasal cannula. We'll try to wean down oxygen to 3 L Generalized weakness  REVIEW OF SYSTEMS:   Review of Systems  Constitutional: Negative for fever, chills and weight loss.  HENT: Negative for ear discharge, ear pain and nosebleeds.   Eyes: Negative for blurred vision, pain and discharge.  Respiratory: Positive for shortness of breath. Negative for sputum production, wheezing and stridor.   Cardiovascular: Positive for leg swelling. Negative for chest pain, palpitations, orthopnea and PND.  Gastrointestinal: Negative for nausea, vomiting, abdominal pain and diarrhea.  Genitourinary: Negative for urgency and frequency.  Musculoskeletal: Negative for back pain and joint pain.  Neurological: Positive for weakness. Negative for sensory change, speech change and focal weakness.  Psychiatric/Behavioral: Negative for depression and hallucinations. The patient is not nervous/anxious.   All other systems reviewed and are negative.  Tolerating Diet:yes Tolerating PT: eval pending  DRUG ALLERGIES:   Allergies  Allergen Reactions  . Penicillins Shortness Of Breath and Other (See Comments)    Has patient had a PCN reaction causing immediate rash, facial/tongue/throat swelling, SOB or lightheadedness with hypotension: Yes Has patient had a PCN reaction causing severe rash involving mucus membranes or skin necrosis: No Has patient had a PCN reaction that required hospitalization No Has patient had a PCN reaction occurring within the last 10 years: No If all of the above answers are "NO", then may proceed with Cephalosporin use.    VITALS:  Blood pressure 174/92, pulse 96, temperature 98.3 F (36.8 C), temperature source Oral, resp. rate 24, weight 122.108 kg (269 lb  3.2 oz), SpO2 94 %.  PHYSICAL EXAMINATION:   Physical Exam  GENERAL:  60 y.o.-year-old patient lying in the bed with no acute distress. Morbidly obese EYES: Pupils equal, round, reactive to light and accommodation. No scleral icterus. Extraocular muscles intact.  HEENT: Head atraumatic, normocephalic. Oropharynx and nasopharynx clear.  NECK:  Supple, no jugular venous distention. No thyroid enlargement, no tenderness.  LUNGS: Decreased breath sounds bilaterally, no wheezing, rales, rhonchi. No use of accessory muscles of respiration.  CARDIOVASCULAR: S1, S2 normal. No murmurs, rubs, or gallops.  ABDOMEN: Soft, nontender, nondistended. Bowel sounds present. No organomegaly or mass.  EXTREMITIES: No cyanosis, no clubbing  3+ edema b/l.    NEUROLOGIC: Cranial nerves II through XII are intact. No focal Motor or sensory deficits b/l.   PSYCHIATRIC: The patient is alert and oriented x 3.  SKIN: No obvious rash, lesion, or ulcer.    LABORATORY PANEL:   CBC  Recent Labs Lab 12/23/15 2202  WBC 5.1  HGB 7.2*  HCT 23.5*  PLT 66*    Chemistries   Recent Labs Lab 12/23/15 2202 12/25/15 0612  NA 139 143  K 4.5 3.1*  CL 106 102  CO2 25 30  GLUCOSE 227* 150*  BUN 26* 22*  CREATININE 0.81 0.69  CALCIUM 7.8* 7.6*  AST 52*  --   ALT 63*  --   ALKPHOS 277*  --   BILITOT 0.6  --     Cardiac Enzymes  Recent Labs Lab 12/24/15 1433  TROPONINI <0.03    RADIOLOGY:  Dg Chest Port 1 View  12/23/2015  CLINICAL DATA:  Shortness of breath. EXAM: PORTABLE CHEST 1 VIEW COMPARISON:  Radiographs  12/15/2015, chest CT 12/14/2015 FINDINGS: Tip of the right chest port in the SVC. The heart is enlarged, stable. Progressive vascular congestion from prior. Development of pulmonary edema. Multifocal pulmonary metastasis, better seen on prior CT. No large pleural effusion or pneumothorax. IMPRESSION: Progressive vascular congestion. Development of pulmonary edema. Grossly stable cardiomegaly.  Multiple pulmonary metastasis, better characterized on prior CT. Electronically Signed   By: Jeb Levering M.D.   On: 12/23/2015 22:36   ASSESSMENT AND PLAN:   Renee Wells is a 60 y.o. 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 Patient was brought in by EMS on BiPAP.   1. Acute on chronic hypoxic respiratory failure secondary to multiple etiologies acute flash pulmonary edema with underlying malignant hypertension, anemia, obstructive sleep apnea and underlying lung metastases -Continue telemetry monitoring  -IV Lasix 20 mg 3 times a day---> changed to by mouth torsemide 40 mg twice a day. -Urine output 8.2 liters since admission - appreciate cardiology Dr. Donivan Scull recommendations  Acute MI ruled out with negative cardiac enzymes We will provide blood transfusion as needed , hemoglobin pending -CPAP daily at bedtime patient intolerant refuses to wear  2. Acute on chronic congestive heart failure diastolic in the setting of malignant hypertension -Echo done in December 2016 showed EF of 55-60% -Continue diuresis and heart meds  3. Malignant hypertension -When necessary IV hydralazine -Continue clonidine, hydralazine dose is increased to 100 mg by mouth every 6 hours , labetalol; titrate as needed  -Added Imdur  4. Metastatic pulmonary nodules bilaterally secondary to poorly differentiated neuroendocrine tumor With metastases to the liver as well -Patient follows at the cancer center as an outpatient after discharge   5. Type 2 diabetes, insulin-requiring Continue home  dose insulin Lantus and aspart along with sliding scale  Discussed with patient and family  Patient with poor prognosis , palliative care consult -seen by Dr Megan Salon 7. Discharge planning -Physical therapy to see patient. -Patient was evaluated by home health prior to her coming to the hospital. This was set up during last hospitalization 12/14/2015. -Care management for discharge planning.   Case discussed with Care Management/Social Worker. Management plans discussed with the patient, family and they are in agreement.  CODE STATUS: full  DVT Prophylaxis: SCD teds only due to thrombocytopenia  TOTAL TIME TAKING CARE OF THIS PATIENT: 35 minutes.  >50% time spent on counselling and coordination of care  POSSIBLE D/C IN *1 DAYS, DEPENDING ON CLINICAL CONDITION.   Rodolfo Notaro M.D on 12/25/2015 at 7:36 AM  Between 7am to 6pm - Pager - [phone]  After 6pm go to [email or web address] - password [email or web address] [facility]  Office  [phone]  CC: Primary care physician; Sharyne Peach, MD

## 2015-12-26 LAB — TYPE AND SCREEN
ABO/RH(D): O POS
Antibody Screen: NEGATIVE
UNIT DIVISION: 0

## 2015-12-26 LAB — GLUCOSE, CAPILLARY
GLUCOSE-CAPILLARY: 213 mg/dL — AB (ref 65–99)
Glucose-Capillary: 295 mg/dL — ABNORMAL HIGH (ref 65–99)
Glucose-Capillary: 182 mg/dL — ABNORMAL HIGH (ref 65–99)
Glucose-Capillary: 250 mg/dL — ABNORMAL HIGH (ref 65–99)

## 2015-12-26 LAB — HEMOGLOBIN
HEMOGLOBIN: 7.5 g/dL — AB (ref 12.0–16.0)
Hemoglobin: 7.7 g/dL — ABNORMAL LOW (ref 12.0–16.0)

## 2015-12-26 LAB — POTASSIUM: POTASSIUM: 2.4 mmol/L — AB (ref 3.5–5.1)

## 2015-12-26 MED ORDER — IPRATROPIUM-ALBUTEROL 0.5-2.5 (3) MG/3ML IN SOLN
3.0000 mL | Freq: Four times a day (QID) | RESPIRATORY_TRACT | Status: DC
  Administered 2015-12-26 – 2015-12-27 (×6): 3 mL via RESPIRATORY_TRACT
  Filled 2015-12-26 (×6): qty 3

## 2015-12-26 MED ORDER — POTASSIUM CHLORIDE 10 MEQ/100ML IV SOLN
10.0000 meq | INTRAVENOUS | Status: AC
  Administered 2015-12-26 (×4): 10 meq via INTRAVENOUS
  Filled 2015-12-26 (×4): qty 100

## 2015-12-26 NOTE — Clinical Social Work Placement (Signed)
   CLINICAL SOCIAL WORK PLACEMENT  NOTE  Date:  12/26/2015  Patient Details  Name: Renee Wells MRN: [id number] Date of Birth: 04/15/1956  Clinical Social Work is seeking post-discharge placement for this patient at the [place] level of care (*CSW will initial, date and re-position this form in  chart as items are completed):  Yes   Patient/family provided with [place] Work Department's list of facilities offering this level of care within the geographic area requested by the patient (or if unable, by the patient's family).  Yes   Patient/family informed of their freedom to choose among providers that offer the needed level of care, that participate in Medicare, Medicaid or managed care program needed by the patient, have an available bed and are willing to accept the patient.  Yes   Patient/family informed of 's ownership interest in [facility] and [facility], as well as of the fact that they are under no obligation to receive care at these facilities.  PASRR submitted to EDS on 12/26/15     PASRR number received on 12/26/15     Existing PASRR number confirmed on       FL2 transmitted to all facilities in geographic area requested by pt/family on 12/26/15     FL2 transmitted to all facilities within larger geographic area on       Patient informed that his/her managed care company has contracts with or will negotiate with certain facilities, including the following:            Patient/family informed of bed offers received.  Patient chooses bed at       Physician recommends and patient chooses bed at      Patient to be transferred to   on  .  Patient to be transferred to facility by       Patient family notified on   of transfer.  Name of family member notified:        PHYSICIAN       Additional Comment:    _______________________________________________ Loralyn Freshwater, LCSW 12/26/2015, 4:09 PM

## 2015-12-26 NOTE — Progress Notes (Signed)
Pt refuses to wear Bipap at night. Pt doesn't feel like she needs to wear it at this time

## 2015-12-26 NOTE — Clinical Social Work Note (Signed)
Clinical Social Work Assessment  Patient Details  Name: Renee Wells MRN: [id number] Date of Birth: 11-11-1956  Date of referral:  12/26/15               Reason for consult:  Facility Placement                Permission sought to share information with:  Chartered certified accountant granted to share information::  Yes, Verbal Permission Granted  Name::      [place]::     Relationship::     Contact Information:     Housing/Transportation Living arrangements for the past 2 months:  [place] of Information:  Patient Patient Interpreter Needed:  None Criminal Activity/Legal Involvement Pertinent to Current Situation/Hospitalization:  No - Comment as needed Significant Relationships:  Adult Children Lives with:  Adult Children Do you feel safe going back to the place where you live?  Yes Need for family participation in patient care:  Yes (Comment)  Care giving concerns:  Patient lives with her son [name] and his fiance in [place].    Social Worker assessment / plan: Holiday representative (CSW) received verbal consult from MD that patient needs SNF. CSW met with patient to discuss D/C plan. Patient's payor source is Medicaid only and PT is recommending SNF. Patient was alone at bedside and sitting up in bed. Patient reported that she lives with her son and his fiance in [place]. Per patient she had her last chemo session in December, which affected her blood pressure and she was admitted to the hospital. Patient reported that chemo is postponed right now. CSW explained that PT is recommending SNF and that under Medicaid she will have to stay for at least 30 days. CSW explained that if patient does not stay for 30 days the facility does not get reimbursed by Medicaid and the patient will get the bill. CSW also explained that Medicaid beds are sometimes difficult to find and the SNF search will have to extend outside of [facility]. Patient verbalized her understanding and is agreeable to SNF search. Per patient she receives around $400 per month in disability. Patient reported that she would discuss SNF option with her son tonight when he comes up to [facility].   FL2 complete and faxed out.    Employment status:  Disabled (Comment on whether or not currently receiving Disability) Insurance information:  Medicaid In [place] PT Recommendations:  [place] / Referral to community resources:  [place] [facility]  Patient/Family's Response to care:  Patient is agreeable to [employer].   Patient/Family's Understanding of and Emotional Response to Diagnosis, Current Treatment, and Prognosis:  Patient was pleasant throughout assessment.   Emotional Assessment Appearance:  Appears stated age Attitude/Demeanor/Rapport:    Affect (typically observed):  Accepting, Adaptable, Pleasant Orientation:  Oriented to Self, Oriented to Place, Oriented to  Time, Oriented to Situation Alcohol / Substance use:  Not Applicable Psych involvement (Current and /or in the community):  No (Comment)  Discharge Needs  Concerns to be addressed:  Discharge Planning Concerns Readmission within the last 30 days:  No Current discharge risk:  Dependent with Mobility Barriers to Discharge:  Continued Medical Work up   Loralyn Freshwater, LCSW 12/26/2015, 4:10 PM

## 2015-12-26 NOTE — Progress Notes (Signed)
[facility] NAME: Renee Wells    MR#:  [id number]  DATE OF BIRTH:  Dec 16, 1956  SUBJECTIVE:  She is a lot better. Urine output since admission 8.2 L. Sats 95% on 4 L nasal cannula. We'll try to wean down oxygen to 3 L Generalized weakness.status post 1 unit of blood transfusion  REVIEW OF SYSTEMS:   Review of Systems  Constitutional: Negative for fever, chills and weight loss.  HENT: Negative for ear discharge, ear pain and nosebleeds.   Eyes: Negative for blurred vision, pain and discharge.  Respiratory: Positive for shortness of breath. Negative for sputum production, wheezing and stridor.   Cardiovascular: Positive for leg swelling. Negative for chest pain, palpitations, orthopnea and PND.  Gastrointestinal: Negative for nausea, vomiting, abdominal pain and diarrhea.  Genitourinary: Negative for urgency and frequency.  Musculoskeletal: Negative for back pain and joint pain.  Neurological: Positive for weakness. Negative for sensory change, speech change and focal weakness.  Psychiatric/Behavioral: Negative for depression and hallucinations. The patient is not nervous/anxious.   All other systems reviewed and are negative.  Tolerating Diet:yes Tolerating PT: recommends rehabilitation  DRUG ALLERGIES:   Allergies  Allergen Reactions  . Penicillins Shortness Of Breath and Other (See Comments)    Has patient had a PCN reaction causing immediate rash, facial/tongue/throat swelling, SOB or lightheadedness with hypotension: Yes Has patient had a PCN reaction causing severe rash involving mucus membranes or skin necrosis: No Has patient had a PCN reaction that required hospitalization No Has patient had a PCN reaction occurring within the last 10 years: No If all of the above answers are "NO", then may proceed with Cephalosporin use.    VITALS:  Blood pressure 154/86, pulse 97, temperature 98.4 F (36.9  C), temperature source Oral, resp. rate 18, weight 122.108 kg (269 lb 3.2 oz), SpO2 92 %.  PHYSICAL EXAMINATION:   Physical Exam  GENERAL:  60 y.o.-year-old patient lying in the bed with no acute distress. Morbidly obese EYES: Pupils equal, round, reactive to light and accommodation. No scleral icterus. Extraocular muscles intact.  HEENT: Head atraumatic, normocephalic. Oropharynx and nasopharynx clear.  NECK:  Supple, no jugular venous distention. No thyroid enlargement, no tenderness.  LUNGS: Decreased breath sounds bilaterally, no wheezing, rales, rhonchi. No use of accessory muscles of respiration.  CARDIOVASCULAR: S1, S2 normal. No murmurs, rubs, or gallops.  ABDOMEN: Soft, nontender, nondistended. Bowel sounds present. No organomegaly or mass.  EXTREMITIES: No cyanosis, no clubbing  3+ edema b/l.    NEUROLOGIC: Cranial nerves II through XII are intact. No focal Motor or sensory deficits b/l.   PSYCHIATRIC: The patient is alert and oriented x 3.  SKIN: No obvious rash, lesion, or ulcer.    LABORATORY PANEL:   CBC  Recent Labs Lab 12/25/15 0612  12/26/15 0452  WBC 3.6  --   --   HGB 6.9*  < > 7.7*  HCT 23.2*  --   --   PLT 54*  --   --   < > = values in this interval not displayed.  Chemistries   Recent Labs Lab 12/23/15 2202 12/25/15 0612 12/26/15 0452  NA 139 143  --   K 4.5 3.1* 2.4*  CL 106 102  --   CO2 25 30  --   GLUCOSE 227* 150*  --   BUN 26* 22*  --   CREATININE 0.81 0.69  --   CALCIUM 7.8* 7.6*  --  AST 52*  --   --   ALT 63*  --   --   ALKPHOS 277*  --   --   BILITOT 0.6  --   --     Cardiac Enzymes  Recent Labs Lab 12/24/15 1433  TROPONINI <0.03    RADIOLOGY:  No results found. ASSESSMENT AND PLAN:   Renee Wells is a 60 y.o. 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 Patient was brought in by EMS on BiPAP.   1. Acute on chronic hypoxic respiratory failure secondary to multiple etiologies acute flash pulmonary edema with underlying malignant hypertension, anemia, obstructive sleep apnea and underlying lung metastases -Continue telemetry monitoring  -IV Lasix 20 mg 3 times a day---> changed to by mouth torsemide 40 mg twice a day. -Urine output 8.2 liters since admission - appreciate cardiology Dr. Donivan Scull recommendations  Acute MI ruled out with negative cardiac enzymes We will provide blood transfusion as needed , hemoglobin pending -CPAP daily at bedtime patient intolerant refuses to wear  2. Acute on chronic congestive heart failure diastolic in the setting of malignant hypertension -Echo done in December 2016 showed EF of 55-60% -Continue diuresis and heart meds  3. Malignant hypertension -When necessary IV hydralazine -Continue clonidine, hydralazine dose is increased to 100 mg by mouth every 6 hours , labetalol; titrate as needed  -Added Imdur  4. Metastatic pulmonary nodules bilaterally secondary to poorly differentiated neuroendocrine tumor With metastases to the liver as well -Patient follows at the cancer center as an outpatient after discharge   5. Type 2 diabetes, insulin-requiring Continue home dose insulin Lantus and aspart along with sliding scale  Discussed with patient and family  Patient with poor prognosis , palliative care consult -seen by Dr Megan Salon  7. Discharge planning -Physical therapy to see patient. -Patient was evaluated by home health prior to her coming to the hospital. This was set up during last hospitalization 12/14/2015. -Care management for discharge  planning.   Case discussed with Care Management/Social Worker. Management plans discussed with the patient, family and they are in agreement.  CODE STATUS: full  DVT Prophylaxis: SCD teds only due to thrombocytopenia  TOTAL TIME TAKING CARE OF THIS PATIENT: 35 minutes.  >50% time spent on counselling and coordination of care  POSSIBLE D/C IN *1 DAYS, DEPENDING ON CLINICAL CONDITION.   Chace Klippel M.D on 12/26/2015 at 12:38 PM  Between 7am to 6pm - Pager - [phone]  After 6pm go to [email or web address] - password [email or web address] [facility]  Office  [phone]  CC: Primary care physician; Sharyne Peach, MD

## 2015-12-26 NOTE — Progress Notes (Signed)
Lab called with critical potassium of 2.4, prime called awaiting call back.

## 2015-12-26 NOTE — Progress Notes (Signed)
Dr. Estanislado Pandy ordered 4 runs of IV potassium for critical lab of 2.4

## 2015-12-26 NOTE — NC FL2 (Signed)
[facility] LEVEL OF CARE SCREENING TOOL     IDENTIFICATION  Patient Name: Renee Wells Birthdate: 1955-12-22 Sex: female Admission Date (Current Location): 12/23/2015  [facility] [place] Number:  Renee Wells  ([id number]) Facility and Address:  [facility], [address] [place], [place] [address]      Provider Number: [id number]  Attending Physician Name and Address:  Fritzi Mandes, MD  Relative Name and Phone Number:       Current Level of Care: Hospital Recommended Level of Care: [place] Prior Approval Number:    Date Approved/Denied:   PASRR Number:  [id number])  Discharge Plan: SNF    Current Diagnoses: Patient Active Problem List   Diagnosis Date Noted  . Acute on chronic diastolic CHF (congestive heart failure) ([place])   . Respiratory distress   . Metastatic lung cancer (metastasis from lung to other site) [facility])   . HTN (hypertension), malignant   . Absolute anemia   . Morbid obesity due to excess calories ([place])   . Pulmonary edema with congestive heart failure ([place]) 12/23/2015  . Acute on chronic diastolic CHF (congestive heart failure), NYHA class 1 ([place]) 12/15/2015  . Acute on chronic systolic congestive heart failure ([place])   . Acute diastolic CHF (congestive heart failure) ([place]) 11/30/2015  . Metastatic cancer ([place])   . Type II diabetes mellitus ([place])   . Anasarca   . Morbid obesity ([place])   . Malignant hypertension   . Dyspnea on exertion   . Liver mass   . Transaminitis   . Hyperglycemia 06/27/2015  . Obesity 06/27/2015  . Accelerated hypertension 06/26/2015  . Hypokalemia 06/26/2015  . HLD (hyperlipidemia) 06/26/2015  . Suspected CHF (congestive heart failure) 06/26/2015  . Bilateral lower extremity edema 06/26/2015    Orientation RESPIRATION BLADDER Height & Weight    Self, Time, Situation, Place  O2 Continent '5\' 4"'$  (162.6 cm) 269 lbs.  BEHAVIORAL SYMPTOMS/MOOD NEUROLOGICAL BOWEL  NUTRITION STATUS      Continent Diet (2 gram sodium)  AMBULATORY STATUS COMMUNICATION OF NEEDS Skin   Extensive Assist Verbally Normal                       Personal Care Assistance Level of Assistance  Bathing, Feeding, Dressing Bathing Assistance: Maximum assistance Feeding assistance: Independent Dressing Assistance: Maximum assistance     Functional Limitations Info             SPECIAL CARE FACTORS FREQUENCY  PT (By licensed PT)                    Contractures      Additional Factors Info  Allergies   Allergies Info:  (Penicillins)           Current Medications (12/26/2015):  This is the current hospital active medication list Current Facility-Administered Medications  Medication Dose Route Frequency Provider Last Rate Last Dose  . 0.9 %  sodium chloride infusion   Intravenous Once Fritzi Mandes, MD      . acetaminophen (TYLENOL) tablet 650 mg  650 mg Oral Q6H PRN Fritzi Mandes, MD       Or  . acetaminophen (TYLENOL) suppository 650 mg  650 mg Rectal Q6H PRN Fritzi Mandes, MD      . albuterol (PROVENTIL) (2.5 MG/3ML) 0.083% nebulizer solution 2.5 mg  2.5 mg Nebulization Q6H PRN Fritzi Mandes, MD   2.5 mg at 12/24/15 0402  . aspirin chewable tablet 81 mg  81 mg Oral Daily Fritzi Mandes, MD   81 mg at 12/26/15 1052  . atorvastatin (LIPITOR) tablet 20 mg  20 mg Oral QHS Fritzi Mandes, MD   20 mg at 12/25/15 2219  . cloNIDine (CATAPRES) tablet 0.3 mg  0.3 mg Oral BID Fritzi Mandes, MD   0.3 mg at 12/26/15 1052  . hydrALAZINE (APRESOLINE) injection 10 mg  10 mg Intravenous Q6H PRN Fritzi Mandes, MD   10 mg at 12/25/15 2331  . hydrALAZINE (APRESOLINE) tablet 100 mg  100 mg Oral QID Fritzi Mandes, MD   100 mg at 12/26/15 1342  . insulin aspart (novoLOG) injection 0-15 Units  0-15 Units Subcutaneous TID WC Fritzi Mandes, MD   8 Units at 12/26/15 1300  . insulin aspart (novoLOG) injection 16 Units  16 Units Subcutaneous TID WC Fritzi Mandes, MD   16 Units at 12/26/15 1300  . insulin glargine  (LANTUS) injection 25 Units  25 Units Subcutaneous QHS Fritzi Mandes, MD   25 Units at 12/25/15 2218  . ipratropium-albuterol (DUONEB) 0.5-2.5 (3) MG/3ML nebulizer solution 3 mL  3 mL Nebulization Q6H WA Pavan Pyreddy, MD   3 mL at 12/26/15 1443  . isosorbide mononitrate (IMDUR) 24 hr tablet 30 mg  30 mg Oral Daily Fritzi Mandes, MD   30 mg at 12/26/15 1053  . labetalol (NORMODYNE) tablet 300 mg  300 mg Oral BID Fritzi Mandes, MD   300 mg at 12/26/15 1051  . magnesium oxide (MAG-OX) tablet 400 mg  400 mg Oral Daily Fritzi Mandes, MD   400 mg at 12/26/15 1052  . morphine 2 MG/ML injection 1 mg  1 mg Intravenous Q4H PRN Fritzi Mandes, MD      . ondansetron (ZOFRAN) tablet 4 mg  4 mg Oral Q6H PRN Fritzi Mandes, MD       Or  . ondansetron (ZOFRAN) injection 4 mg  4 mg Intravenous Q6H PRN Fritzi Mandes, MD      . ondansetron (ZOFRAN) tablet 8 mg  8 mg Oral Q8H PRN Fritzi Mandes, MD      . oxyCODONE-acetaminophen (PERCOCET/ROXICET) 5-325 MG per tablet 1 tablet  1 tablet Oral Q4H PRN Nicholes Mango, MD   1 tablet at 12/26/15 0825  . potassium chloride SA (K-DUR,KLOR-CON) CR tablet 40 mEq  40 mEq Oral TID Fritzi Mandes, MD   40 mEq at 12/26/15 1053  . senna-docusate (Senokot-S) tablet 1 tablet  1 tablet Oral QHS PRN Fritzi Mandes, MD      . torsemide (DEMADEX) tablet 40 mg  40 mg Oral BID Fritzi Mandes, MD   40 mg at 12/26/15 0825     Discharge Medications: Please see discharge summary for a list of discharge medications.  Relevant Imaging Results:  Relevant Lab Results:   Additional Information  ([id number])  Renee Cane, LCSW

## 2015-12-27 ENCOUNTER — Inpatient Hospital Stay: Payer: Medicaid Other

## 2015-12-27 LAB — BLOOD GAS, VENOUS
Acid-Base Excess: 4.6 mmol/L — ABNORMAL HIGH (ref 0.0–3.0)
Bicarbonate: 28.3 mEq/L — ABNORMAL HIGH (ref 21.0–28.0)
PATIENT TEMPERATURE: 37
pCO2, Ven: 38 mmHg — ABNORMAL LOW (ref 44.0–60.0)
pH, Ven: 7.48 — ABNORMAL HIGH (ref 7.320–7.430)

## 2015-12-27 LAB — POTASSIUM: Potassium: 2.7 mmol/L — CL (ref 3.5–5.1)

## 2015-12-27 LAB — MAGNESIUM: MAGNESIUM: 1.5 mg/dL — AB (ref 1.7–2.4)

## 2015-12-27 LAB — GLUCOSE, CAPILLARY
GLUCOSE-CAPILLARY: 267 mg/dL — AB (ref 65–99)
GLUCOSE-CAPILLARY: 266 mg/dL — AB (ref 65–99)
Glucose-Capillary: 161 mg/dL — ABNORMAL HIGH (ref 65–99)
Glucose-Capillary: 196 mg/dL — ABNORMAL HIGH (ref 65–99)

## 2015-12-27 MED ORDER — ISOSORBIDE MONONITRATE ER 60 MG PO TB24: 60.0000 mg | ORAL_TABLET | Freq: Every day | ORAL | Status: AC

## 2015-12-27 MED ORDER — MAGNESIUM SULFATE 2 GM/50ML IV SOLN
2.0000 g | Freq: Once | INTRAVENOUS | Status: DC
  Filled 2015-12-27: qty 50

## 2015-12-27 MED ORDER — HYDRALAZINE HCL 100 MG PO TABS: 100.0000 mg | ORAL_TABLET | Freq: Four times a day (QID) | ORAL | Status: AC

## 2015-12-27 MED ORDER — POTASSIUM CHLORIDE 10 MEQ/100ML IV SOLN
10.0000 meq | INTRAVENOUS | Status: DC
  Administered 2015-12-27: 10 meq via INTRAVENOUS
  Filled 2015-12-27 (×4): qty 100

## 2015-12-27 MED ORDER — TORSEMIDE 20 MG PO TABS: 40.0000 mg | ORAL_TABLET | Freq: Two times a day (BID) | ORAL | Status: DC

## 2015-12-27 MED ORDER — POTASSIUM CHLORIDE CRYS ER 10 MEQ PO TBCR
30.0000 meq | EXTENDED_RELEASE_TABLET | Freq: Once | ORAL | Status: AC
  Administered 2015-12-27: 30 meq via ORAL
  Filled 2015-12-27: qty 1

## 2015-12-27 MED ORDER — FERROUS SULFATE 325 (65 FE) MG PO TABS
325.0000 mg | ORAL_TABLET | Freq: Three times a day (TID) | ORAL | Status: DC
  Administered 2015-12-27 – 2015-12-28 (×3): 325 mg via ORAL
  Filled 2015-12-27 (×3): qty 1

## 2015-12-27 MED ORDER — FERROUS SULFATE 325 (65 FE) MG PO TABS: 325.0000 mg | ORAL_TABLET | Freq: Three times a day (TID) | ORAL | Status: AC

## 2015-12-27 MED ORDER — POTASSIUM CHLORIDE CRYS ER 20 MEQ PO TBCR
40.0000 meq | EXTENDED_RELEASE_TABLET | Freq: Once | ORAL | Status: AC
  Administered 2015-12-27: 40 meq via ORAL

## 2015-12-27 NOTE — Progress Notes (Signed)
Pt refuses Bipap. O2 Sat 93 on 4 liter nasal cannula.

## 2015-12-27 NOTE — Discharge Summary (Deleted)
[facility] [place] NAME: Renee Wells    MR#:  [id number]  DATE OF BIRTH:  May 06, 1956  DATE OF ADMISSION:  12/23/2015 ADMITTING PHYSICIAN: Fritzi Mandes, MD  DATE OF DISCHARGE: 12/27/2015  PRIMARY CARE PHYSICIAN: Sharyne Peach, MD    ADMISSION DIAGNOSIS:  Respiratory distress [R06.00] SOB (shortness of breath) [R06.02] Acute on chronic congestive heart failure, unspecified congestive heart failure type ([place]) [I50.9]  DISCHARGE DIAGNOSIS:  Acute on chronic respiratory failure secondary to acute on chronic congestive heart failure diastolic improved Acute on chronic anemia status post 1 unit of blood transfusion Morbid obesity Metastatic pulmonary nodules secondary to neuroendocrine tumor with ongoing chemotherapy at the cancer center Malignant hypertension Hypokalemia SECONDARY DIAGNOSIS:   Past Medical History  Diagnosis Date  . Stroke [facility])     a. 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  . Malignant hypertension     a. Admitted 06/2015 and 11/2015;  b. 06/2015 nl aldosterone/renin activity;  c. Polypharmacy.  [name] HLD (hyperlipidemia)   . Morbid obesity ([place])   . Anasarca     a. 11/2015 Echo: EF 55-65%, triv TR/MR.  [name] Hypokalemia   . Type II diabetes mellitus ([place])     a. 11/2015 A1c 9.5.  . Metastatic cancer ([place])     a. 11/2015 CT abd/pelvis: large R hepatic lobe mass extending inf to pararenal space on R. Complete occlusion of R portal vein. L hepatic lobe lesion. Bilat LL pulm mets. Skeletal mets 2/ Fx of R iliac wing. Enhancing uterine fundal mass.  [name] History of CVA (cerebrovascular accident)   . Left-sided weakness   . IDA (iron deficiency anemia)   . Insomnia   . Cancer ([place])   . History of echocardiogram     a. 06/2015: EF: 60-65%; b. 11/2015: EF: 55-65%     HOSPITAL COURSE:  Renee Wells is a 60 y.o. female with a known history of malignant hypertension, diastolic congestive heart failure, chronic  respiratory failure on home oxygen secondary to bilateral multiple pulmonary metastatic disease, metastatic poorly differentiated neuroendocrine tumor undergoing chemotherapy who was recently admitted 1228 01/29/1930 with acute pulmonary bowel comes it back to the emergency room with similar symptoms of progressive increasing shortness of breath over the day. Patient was brought in by EMS on BiPAP.   1. Acute on chronic hypoxic respiratory failure secondary to multiple etiologies acute flash pulmonary edema with underlying malignant hypertension, anemia, obstructive sleep apnea and underlying lung metastases -Continue telemetry monitoring  -IV Lasix 20 mg 3 times a day---> changed to by mouth torsemide 40 mg twice a day. -Urine output 15.4 liters since admission - appreciate cardiology Dr. Donivan Scull recommendations  Acute MI ruled out with negative cardiac enzymes -CPAP daily at bedtime patient intolerant refuses to wear  2. Acute on chronic congestive heart failure diastolic in the setting of malignant hypertension -Echo done in December 2016 showed EF of 55-60% -Continue diuresis and heart meds  3. Malignant hypertension -When necessary IV hydralazine -Continue clonidine, hydralazine dose is increased to 100 mg by mouth every 6 hours , labetalol; titrate as needed  -Added Imdur  4. Metastatic pulmonary nodules bilaterally secondary to poorly differentiated neuroendocrine tumor With metastases to the liver as well -Patient follows at the cancer center as an outpatient after discharge  -Patient has an IV port which was placed a few weeks ago. Discussed with vascular Dr. Delana Meyer regarding its accessibility. Advised to continue access as possible. Dr. Delana Meyer see patient  in next few weeks. Per hipatient had some bleeding during port placement and needs to heal before further manipulation is done.  5. Type patient had 2 diabetes, insulin-requiring Continue home dose insulin Lantus and aspart  along with sliding   6. acute on chronic anemia multifactorial with ongoing chemotherapy for neuroendocrine tumor  -Since patient was symptomatic with CHF she was transfused with 1 unit of blood transfusion  -Hemoglobin at present is 7.7.  -Consider IV iron at the cancer center.  -Started by mouth ferrous sulfate.    7. Hypokalemia secondary to excessive diuresis   we will replete IV and by mouth  8. Discharge planning -Physical therapy to see patient. -Patient was evaluated by home health prior to her coming to the hospital. This was set up during last hospitalization 12/14/2015. -Care management for discharge planning. To rehab today  CODE STATUS: full  DVT Prophylaxis: SCD teds only due to thrombocytopenia CONSULTS OBTAINED:  Treatment Team:  Minna Merritts, MD  DRUG ALLERGIES:   Allergies  Allergen Reactions  . Penicillins Shortness Of Breath and Other (See Comments)    Has patient had a PCN reaction causing immediate rash, facial/tongue/throat swelling, SOB or lightheadedness with hypotension: Yes Has patient had a PCN reaction causing severe rash involving mucus membranes or skin necrosis: No Has patient had a PCN reaction that required hospitalization No Has patient had a PCN reaction occurring within the last 10 years: No If all of the above answers are "NO", then may proceed with Cephalosporin use.    DISCHARGE MEDICATIONS:   Current Discharge Medication List    START taking these medications   Details  ferrous sulfate 325 (65 FE) MG tablet Take 1 tablet (325 mg total) by mouth 3 (three) times daily with meals. Qty: 90 tablet, Refills: 3    isosorbide mononitrate (IMDUR) 60 MG 24 hr tablet Take 1 tablet (60 mg total) by mouth daily. Qty: 30 tablet, Refills: 3    torsemide (DEMADEX) 20 MG tablet Take 2 tablets (40 mg total) by mouth 2 (two) times daily. Qty: 60 tablet, Refills: 3      CONTINUE these medications which have CHANGED   Details  hydrALAZINE  (APRESOLINE) 100 MG tablet Take 1 tablet (100 mg total) by mouth 4 (four) times daily. Qty: 120 tablet, Refills: 2      CONTINUE these medications which have NOT CHANGED   Details  aspirin 81 MG chewable tablet Chew 1 tablet (81 mg total) by mouth daily.    atorvastatin (LIPITOR) 20 MG tablet Take 20 mg by mouth at bedtime. Reported on 12/07/2015    cloNIDine (CATAPRES) 0.3 MG tablet Take 1 tablet (0.3 mg total) by mouth 2 (two) times daily. Qty: 60 tablet, Refills: 0    insulin aspart (NOVOLOG) 100 UNIT/ML injection Inject 16 Units into the skin 3 (three) times daily with meals. Qty: 10 mL, Refills: 1    insulin glargine (LANTUS) 100 UNIT/ML injection Inject 0.25 mLs (25 Units total) into the skin at bedtime. Qty: 10 mL, Refills: 1    labetalol (NORMODYNE) 300 MG tablet Take 1 tablet (300 mg total) by mouth 2 (two) times daily. Qty: 60 tablet, Refills: 0    magnesium oxide (MAG-OX) 400 (241.3 MG) MG tablet Take 1 tablet (400 mg total) by mouth daily. Qty: 30 tablet, Refills: 0    ondansetron (ZOFRAN) 8 MG tablet Take 1 tablet (8 mg total) by mouth every 8 (eight) hours as needed for nausea or vomiting. Qty: 90 tablet,  Refills: 0    oxyCODONE-acetaminophen (ROXICET) 5-325 MG tablet Take 1 tablet by mouth every 4 (four) hours as needed for severe pain. Qty: 30 tablet, Refills: 0    potassium chloride SA (K-DUR,KLOR-CON) 20 MEQ tablet Take 2 tablets (40 mEq total) by mouth 2 (two) times daily. Qty: 60 tablet, Refills: 0      STOP taking these medications     furosemide (LASIX) 40 MG tablet         If you experience worsening of your admission symptoms, develop shortness of breath, life threatening emergency, suicidal or homicidal thoughts you must seek medical attention immediately by calling 911 or calling your MD immediately  if symptoms less severe.  You Must read complete instructions/literature along with all the possible adverse reactions/side effects for all the  Medicines you take and that have been prescribed to you. Take any new Medicines after you have completely understood and accept all the possible adverse reactions/side effects.   Please note  You were cared for by a hospitalist during your hospital stay. If you have any questions about your discharge medications or the care you received while you were in the hospital after you are discharged, you can call the unit and asked to speak with the hospitalist on call if the hospitalist that took care of you is not available. Once you are discharged, your primary care physician will handle any further medical issues. Please note that NO REFILLS for any discharge medications will be authorized once you are discharged, as it is imperative that you return to your primary care physician (or establish a relationship with a primary care physician if you do not have one) for your aftercare needs so that they can reassess your need for medications and monitor your lab values. Today   SUBJECTIVE   improving slowly. No complaints.   VITAL SIGNS:  Blood pressure 179/88, pulse 91, temperature 98.1 F (36.7 C), temperature source Oral, resp. rate 22, weight 122.108 kg (269 lb 3.2 oz), SpO2 99 %.  I/O:   Intake/Output Summary (Last 24 hours) at 12/27/15 0933 Last data filed at 12/27/15 0223  Gross per 24 hour  Intake    840 ml  Output   2400 ml  Net  -1560 ml    PHYSICAL EXAMINATION:  GENERAL:  60 y.o.-year-old patient lying in the bed with no acute distress.  morbidly obese a  EYES: Pupils equal, round, reactive to light and accommodation. No scleral icterus. Extraocular muscles intact.  HEENT: Head atraumatic, normocephalic. Oropharynx and nasopharynx clear.  NECK:  Supple, no jugular venous distention. No thyroid enlargement, no tenderness.  LUNGS: distant  breath sounds bilaterally, no wheezing, rales,rhonchi or crepitation. No use of accessory muscles of respiration.  CARDIOVASCULAR: S1, S2 normal.  No murmurs, rubs, or gallops.  ABDOMEN: Soft, non-tender, non-distended. Bowel sounds present. No organomegaly or mass.  EXTREMITIES: 1+ pedal edema, cyanosis, or clubbing.  NEUROLOGIC: Cranial nerves II through XII are intact. Muscle strength 5/5 in all extremities. Sensation intact. Gait not checked.  PSYCHIATRIC: The patient is alert and oriented x 3.  SKIN: No obvious rash, lesion, or ulcer.   DATA REVIEW:   CBC   Recent Labs Lab 12/25/15 0612  12/26/15 0452  WBC 3.6  --   --   HGB 6.9*  < > 7.7*  HCT 23.2*  --   --   PLT 54*  --   --   < > = values in this interval not displayed.  Chemistries  Recent Labs Lab 12/23/15 2202 12/25/15 0612 12/26/15 0452  NA 139 143  --   K 4.5 3.1* 2.4*  CL 106 102  --   CO2 25 30  --   GLUCOSE 227* 150*  --   BUN 26* 22*  --   CREATININE 0.81 0.69  --   CALCIUM 7.8* 7.6*  --   AST 52*  --   --   ALT 63*  --   --   ALKPHOS 277*  --   --   BILITOT 0.6  --   --     Microbiology Results   Recent Results (from the past 240 hour(s))  Culture, blood (routine x 2)     Status: None (Preliminary result)   Collection Time: 12/23/15 10:03 PM  Result Value Ref Range Status   Specimen Description BLOOD  Final   Special Requests NONE  Final   Culture NO GROWTH 3 DAYS  Final   Report Status PENDING  Incomplete  Urine culture     Status: None   Collection Time: 12/23/15 11:09 PM  Result Value Ref Range Status   Specimen Description URINE, RANDOM  Final   Special Requests NONE  Final   Culture NO GROWTH 2 DAYS  Final   Report Status 12/25/2015 FINAL  Final  Culture, blood (routine x 2)     Status: None (Preliminary result)   Collection Time: 12/23/15 11:12 PM  Result Value Ref Range Status   Specimen Description BLOOD BLOOD RIGHT FOREARM  Final   Special Requests BOTTLES DRAWN AEROBIC AND ANAEROBIC 5ML  Final   Culture NO GROWTH 3 DAYS  Final   Report Status PENDING  Incomplete    RADIOLOGY:  No results found.   Management  plans discussed with the patient, family and they are in agreement.  CODE STATUS:     Code Status Orders        Start     Ordered   12/24/15 0240  Full code   Continuous     12/24/15 0239      TOTAL TIME TAKING CARE OF THIS PATIENT: 40 minutes.    Azile Minardi M.D on 12/27/2015 at 9:33 AM  Between 7am to 6pm - Pager - [phone] After 6pm go to [email or web address] - password [email or web address] [facility]  Office  [phone]  CC: Primary care physician; Sharyne Peach, MD

## 2015-12-27 NOTE — Care Management (Signed)
Notified [facility] that patient is to discharge to skilled nursing facility

## 2015-12-27 NOTE — Discharge Instructions (Signed)
PT at rehab

## 2015-12-27 NOTE — Progress Notes (Signed)
Clinical Education officer, museum (CSW) met with patient and her son and daughter in law were at bedside. CSW presented bed offers. Patient has 2 bed offers only [facility] and [facility] in [place]. Per son he would like to go tour before he makes a decision. Son reported that he went to [facility] and did not like it and inquired about [facility] or acute inpatient rehab at [facility]. CSW explained that [facility] declined patient and has no Medicaid beds. CSW explained that patient does not qualify for acute inpatient rehab at this time. CSW explained that patient's bed options are limited because she has Medicaid only.  CSW is waiting to hear from son after he tours [facility]. CSW will continue to follow and assist as needed.   Blima Rich, [place] [phone]

## 2015-12-27 NOTE — Progress Notes (Signed)
MEDICATION RELATED CONSULT NOTE - INITIAL   Pharmacy Consult for electrolyte management Indication: hypokalemia  Vital Signs: Temp: 98.1 F (36.7 C) (01/09 1211) Temp Source: Oral (01/09 1211) BP: 175/93 mmHg (01/09 1213) Pulse Rate: 101 (01/09 1213) Intake/Output from previous day: 01/08 0701 - 01/09 0700 In: 840 [P.O.:840] Out: 2900 [Urine:2900] Intake/Output from this shift:    Labs:  Recent Labs  12/25/15 0612 12/26/15 0101 12/26/15 0452 12/27/15 1350  WBC 3.6  --   --   --   HGB 6.9* 7.5* 7.7*  --   HCT 23.2*  --   --   --   PLT 54*  --   --   --   CREATININE 0.69  --   --   --   MG  --   --   --  1.5*   Estimated Creatinine Clearance: 95.2 mL/min (by C-G formula based on Cr of 0.69).  Assessment: Pharmacy consulted to manage electrolytes (specifically potassium) in this 60 year old female. Patient has had hypokalemia for several days now despite aggressive repletion.   Repletion history: Date K level  Replacement 1/7 3.1  120 mEq PO K-Dur 1/8 2.4  40 mEq IV KCl & 120 mEq PO K-Dur 1/9  2.7 ('@1400'$ ) 10 mEq IV KCl & 110 mEq PO K-Dur  Patient's potassium remains low at 2.7 mmol/L this afternoon despite aggressive repletion. Thus far today, patient has received 10 mEq IV KCl and 110 mEq PO K-Dur, for a total of 120 mEq potassium replacement. There are multiple factors that could be contributing to hypokalemia: patient is currently prescribed torsemide 40 mg PO BID with a urine output of 2900 mL, patient has elevated blood glucose and has received 27 units of correctional insulin in the past 24 hours (in addition to home regimen of lantus 25 units qHS and aspart 16 units TID with meals), and patient is on sodium restricted diet but not carb restricted diet.  Magnesium level was ordered and is low at 1.5 mg/dL despite taking mag ox 400 mg PO daily.   Patient is currently receiving K-Dur 40 mEq TID scheduled, with 2 doses remaining to be given today. These doses will  bring patient to a total of 200 mEq K replacement, which is maximum dose. Per MD, patient can only tolerate PO replacement & IV replacement is not an option.  Goal of Therapy:  Electrolytes within normal limits Mg 1.7 - 2.4 mg/dL K 3.5 - 5.1 mmol/dL  Plan:  Replace magnesium with a 2 g IV one time dose to be given now  Per discussion with MD, torsemide 40 mg dose will be held tonight and patient's diet will be changed to carb restricted diet. This should help alleviate K loss with urine output and also reduce insulin requirements since insulin can lower potassium. Replacing magnesium should also help increase K absorption.   Patient is scheduled to receive two more doses of K-Dur 40 mEq today, bringing total replacement to 200 mEq.   Will recheck K and Mg with AM labs tomorrow. Pharmacy will continue to monitor, thank you for the consult.  Darylene Price Wilson Sample 12/27/2015,3:28 PM

## 2015-12-27 NOTE — NC FL2 (Signed)
[place] LEVEL OF CARE SCREENING TOOL     IDENTIFICATION  Patient Name: Renee Wells Birthdate: 02-Sep-1956 Sex: female Admission Date (Current Location): 12/23/2015  [facility] and [place] Number:  [name]  ([id number]) Facility and Address:  [facility], [address], [place], [place] [address]      Provider Number: [id number]  Attending Physician Name and Address:  Fritzi Mandes, MD  Relative Name and Phone Number:       Current Level of Care: Hospital Recommended Level of Care: [place] Prior Approval Number:    Date Approved/Denied:   PASRR Number:  [id number])  Discharge Plan: SNF    Current Diagnoses: Patient Active Problem List   Diagnosis Date Noted  . Acute on chronic diastolic CHF (congestive heart failure) ([place])   . Respiratory distress   . Metastatic lung cancer (metastasis from lung to other site) [facility])   . HTN (hypertension), malignant   . Absolute anemia   . Morbid obesity due to excess calories ([place])   . Pulmonary edema with congestive heart failure ([place]) 12/23/2015  . Acute on chronic diastolic CHF (congestive heart failure), NYHA class 1 ([place]) 12/15/2015  . Acute on chronic systolic congestive heart failure ([place])   . Acute diastolic CHF (congestive heart failure) ([place]) 11/30/2015  . Metastatic cancer ([place])   . Type II diabetes mellitus ([place])   . Anasarca   . Morbid obesity ([place])   . Malignant hypertension   . Dyspnea on exertion   . Liver mass   . Transaminitis   . Hyperglycemia 06/27/2015  . Obesity 06/27/2015  . Accelerated hypertension 06/26/2015  . Hypokalemia 06/26/2015  . HLD (hyperlipidemia) 06/26/2015  . Suspected CHF (congestive heart failure) 06/26/2015  . Bilateral lower extremity edema 06/26/2015    Orientation RESPIRATION BLADDER Height & Weight    Self, Time, Situation, Place  O2 Continent '5\' 4"'$  (162.6 cm) 269 lbs.  BEHAVIORAL SYMPTOMS/MOOD NEUROLOGICAL BOWEL  NUTRITION STATUS      Continent Diet (2 gram sodium)  AMBULATORY STATUS COMMUNICATION OF NEEDS Skin   Extensive Assist Verbally Normal                       Personal Care Assistance Level of Assistance  Bathing, Feeding, Dressing Bathing Assistance: Maximum assistance Feeding assistance: Independent Dressing Assistance: Maximum assistance     Functional Limitations Info             SPECIAL CARE FACTORS FREQUENCY  PT (By licensed PT)                    Contractures      Additional Factors Info  Allergies   Allergies Info:  (Penicillins)           Current Medications (12/27/2015):  This is the current hospital active medication list Current Facility-Administered Medications  Medication Dose Route Frequency Provider Last Rate Last Dose  . 0.9 %  sodium chloride infusion   Intravenous Once Fritzi Mandes, MD   Stopped at 12/27/15 0830  . acetaminophen (TYLENOL) tablet 650 mg  650 mg Oral Q6H PRN Fritzi Mandes, MD       Or  . acetaminophen (TYLENOL) suppository 650 mg  650 mg Rectal Q6H PRN Fritzi Mandes, MD      . albuterol (PROVENTIL) (2.5 MG/3ML) 0.083% nebulizer solution 2.5 mg  2.5 mg Nebulization Q6H PRN Fritzi Mandes, MD   2.5 mg at 12/24/15 0402  . aspirin chewable tablet 81  mg  81 mg Oral Daily Fritzi Mandes, MD   81 mg at 12/26/15 1052  . atorvastatin (LIPITOR) tablet 20 mg  20 mg Oral QHS Fritzi Mandes, MD   20 mg at 12/26/15 2120  . cloNIDine (CATAPRES) tablet 0.3 mg  0.3 mg Oral BID Fritzi Mandes, MD   0.3 mg at 12/26/15 2120  . hydrALAZINE (APRESOLINE) injection 10 mg  10 mg Intravenous Q6H PRN Fritzi Mandes, MD   10 mg at 12/25/15 2331  . hydrALAZINE (APRESOLINE) tablet 100 mg  100 mg Oral QID Fritzi Mandes, MD   100 mg at 12/26/15 2120  . insulin aspart (novoLOG) injection 0-15 Units  0-15 Units Subcutaneous TID WC Fritzi Mandes, MD   3 Units at 12/27/15 0857  . insulin aspart (novoLOG) injection 16 Units  16 Units Subcutaneous TID WC Fritzi Mandes, MD   16 Units at 12/27/15 0856   . insulin glargine (LANTUS) injection 25 Units  25 Units Subcutaneous QHS Fritzi Mandes, MD   25 Units at 12/26/15 2121  . ipratropium-albuterol (DUONEB) 0.5-2.5 (3) MG/3ML nebulizer solution 3 mL  3 mL Nebulization Q6H WA Saundra Shelling, MD   3 mL at 12/27/15 0759  . isosorbide mononitrate (IMDUR) 24 hr tablet 30 mg  30 mg Oral Daily Fritzi Mandes, MD   30 mg at 12/26/15 1053  . labetalol (NORMODYNE) tablet 300 mg  300 mg Oral BID Fritzi Mandes, MD   300 mg at 12/26/15 2121  . magnesium oxide (MAG-OX) tablet 400 mg  400 mg Oral Daily Fritzi Mandes, MD   400 mg at 12/26/15 1052  . morphine 2 MG/ML injection 1 mg  1 mg Intravenous Q4H PRN Fritzi Mandes, MD      . ondansetron (ZOFRAN) tablet 4 mg  4 mg Oral Q6H PRN Fritzi Mandes, MD       Or  . ondansetron (ZOFRAN) injection 4 mg  4 mg Intravenous Q6H PRN Fritzi Mandes, MD      . ondansetron (ZOFRAN) tablet 8 mg  8 mg Oral Q8H PRN Fritzi Mandes, MD      . oxyCODONE-acetaminophen (PERCOCET/ROXICET) 5-325 MG per tablet 1 tablet  1 tablet Oral Q4H PRN Nicholes Mango, MD   1 tablet at 12/27/15 0333  . potassium chloride 10 mEq in 100 mL IVPB  10 mEq Intravenous Q1 Hr x 4 Fritzi Mandes, MD      . potassium chloride SA (K-DUR,KLOR-CON) CR tablet 40 mEq  40 mEq Oral TID Fritzi Mandes, MD   40 mEq at 12/26/15 2121  . potassium chloride SA (K-DUR,KLOR-CON) CR tablet 40 mEq  40 mEq Oral Once Fritzi Mandes, MD      . senna-docusate (Senokot-S) tablet 1 tablet  1 tablet Oral QHS PRN Fritzi Mandes, MD      . torsemide (DEMADEX) tablet 40 mg  40 mg Oral BID Fritzi Mandes, MD   40 mg at 12/27/15 0900     Discharge Medications: Please see discharge summary for a list of discharge medications.  Relevant Imaging Results:  Relevant Lab Results:   Additional Information  ([id number])  Loralyn Freshwater, LCSW

## 2015-12-27 NOTE — Progress Notes (Signed)
Physical Therapy Treatment Patient Details Name: Renee Wells MRN: [id number] DOB: 1956/03/13 Today's Date: 12/27/2015    History of Present Illness 60 yo female with pulmonary edema with CHF, chronic O2 use at 2L at home, has old L side weakness from stroke 3 years ago.  PMHx:  liver CA with new pulmonary mets, on chemotherapy, HTN, CVA, CHF    PT Comments    Pt demonstrates severe weakness with bed mobility and attempted transfers. She is unable to come to full upright standing with maxA+2 and rolling [place]. Unable to attempt ambulation at this time due to weakness and inability to stand. Pt on 4 L/min O2 and SaO2 remains >90% on supplemental O2 throughout. Pt does report DOE with increased RR with bed mobility and attempted transfers. Pt will need SNF placement at discharge to facilitate safe return home. Pt will benefit from skilled PT services to address deficits in strength, balance, and mobility in order to return to full function at home.    Follow Up Recommendations  SNF     Equipment Recommendations  Other (comment) (TBD at facility)    Recommendations for Other Services       Precautions / Restrictions Precautions Precautions: Fall Restrictions Weight Bearing Restrictions: No    Mobility  Bed Mobility Overal bed mobility: Needs Assistance Bed Mobility: Supine to Sit;Sit to Supine     Supine to sit: Max assist;HOB elevated Sit to supine: Max assist;HOB elevated   General bed mobility comments: Heavy cues for sequencing. Pt with L sided weakness which makes rolling difficult. Pt with severe weakness requiring maxA+1 assist for both phases of transfer. HOB and rails utilized. Bed in trendelenberg and +2 to scoot up toward [facility]  Transfers Overall transfer level: Needs assistance Equipment used: Rolling Reels (2 wheeled) Transfers: Sit to/from Stand Sit to Stand: Max assist;+2 physical assistance         General transfer comment: Pt unable to come to full  upright standing and complains of low back pain with attempted transfers. Transfers attempted x 2 and pt requires hand held assist to move LUE onto [place]. Unable to straighten arms and LEs buckling throughout attempt. Pt unable to come to standing at this time despite encouragement and heavy cues for technique. Unable to attempt ambulation at this time  Ambulation/Gait             General Gait Details: Unable to perform at this time   Stairs            Wheelchair Mobility    Modified Rankin (Stroke Patients Only)       Balance Overall balance assessment: Needs assistance Sitting-balance support: Feet supported Sitting balance-Leahy Scale: Fair Sitting balance - Comments: Able to sit at EOB with no UE assist and maintain balance after some initial assist. Initially pt complains of low back pain which improves with extended time in sitting     Standing balance-Leahy Scale: Zero Standing balance comment: Unable to come to full upright standing due to LE weakness                    Cognition Arousal/Alertness: Awake/alert Behavior During Therapy: WFL for tasks assessed/performed Overall Cognitive Status: Within Functional Limits for tasks assessed                      Exercises General Exercises - Lower Extremity Ankle Circles/Pumps: Strengthening;Both;15 reps;Supine Quad Sets: Strengthening;Both;15 reps;Supine Gluteal Sets: Strengthening;Both;15 reps;Supine Short Arc Quad: Strengthening;Both;15 reps;Supine Long  Arc Quad: Strengthening;Both;10 reps;Seated Heel Slides: Strengthening;Both;15 reps;Supine Hip ABduction/ADduction: Strengthening;Both;15 reps;Supine Straight Leg Raises: Strengthening;Both;15 reps;Supine    General Comments        Pertinent Vitals/Pain Pain Assessment: 0-10 Pain Score: 6  Pain Location: R hip pain, prior to admission from injury Pain Intervention(s): Monitored during session    Home Living                       Prior Function            PT Goals (current goals can now be found in the care plan section) Acute Rehab PT Goals Patient Stated Goal: pt would rather go home PT Goal Formulation: With patient Time For Goal Achievement: 01/08/16 Potential to Achieve Goals: Fair Progress towards PT goals: Progressing toward goals    Frequency  Min 2X/week    PT Plan Current plan remains appropriate    Co-evaluation             End of Session Equipment Utilized During Treatment: Oxygen Activity Tolerance: Patient limited by fatigue Patient left: with bed alarm set;with call bell/phone within reach;in bed     Time: 6237-6283 PT Time Calculation (min) (ACUTE ONLY): 35 min  Charges:  $Therapeutic Exercise: 8-22 mins $Therapeutic Activity: 8-22 mins                    G Codes:     Lyndel Safe Huprich PT, DPT   Huprich,Jason 12/27/2015, 4:41 PM

## 2015-12-27 NOTE — Progress Notes (Signed)
Patient may remain without IV access until discharge tomorrow. Per Dr. Posey Pronto, verbal order

## 2015-12-27 NOTE — Progress Notes (Signed)
Patient is not able to tolerate 100 ml/hr rate of IV potassium.  Have slowed infusion down to 40 ml/hr and contacted Dr. Fritzi Mandes.  Will discontinue IV potassium upon completion of first bag and will order an additional PO dose.

## 2015-12-27 NOTE — Progress Notes (Signed)
Per RN discharge was cancelled today due to patient's potassium. Clinical Education officer, museum ([place]) updated Renee Wells with [employer] [facility] and [facility]. Per Renee Wells patient's son completed admissions paper work at [facility]. CSW left patient's son a Advertising account executive. CSW will continue to follow and assist as needed.   Blima Rich, [place] [phone]

## 2015-12-28 ENCOUNTER — Ambulatory Visit: Payer: Medicaid Other

## 2015-12-28 ENCOUNTER — Non-Acute Institutional Stay (SKILLED_NURSING_FACILITY): Payer: Medicaid Other | Admitting: Adult Health

## 2015-12-28 ENCOUNTER — Inpatient Hospital Stay: Payer: Medicaid Other

## 2015-12-28 ENCOUNTER — Encounter: Payer: Self-pay | Admitting: Adult Health

## 2015-12-28 ENCOUNTER — Telehealth: Payer: Self-pay | Admitting: *Deleted

## 2015-12-28 ENCOUNTER — Inpatient Hospital Stay: Payer: Medicaid Other | Attending: Internal Medicine

## 2015-12-28 DIAGNOSIS — I5033 Acute on chronic diastolic (congestive) heart failure: Secondary | ICD-10-CM

## 2015-12-28 DIAGNOSIS — C3490 Malignant neoplasm of unspecified part of unspecified bronchus or lung: Secondary | ICD-10-CM

## 2015-12-28 DIAGNOSIS — E669 Obesity, unspecified: Secondary | ICD-10-CM

## 2015-12-28 LAB — CULTURE, BLOOD (ROUTINE X 2)
Culture: NO GROWTH
Culture: NO GROWTH

## 2015-12-28 LAB — BASIC METABOLIC PANEL
Anion gap: 8 (ref 5–15)
BUN: 25 mg/dL — ABNORMAL HIGH (ref 6–20)
CALCIUM: 7.1 mg/dL — AB (ref 8.9–10.3)
CHLORIDE: 94 mmol/L — AB (ref 101–111)
CO2: 37 mmol/L — ABNORMAL HIGH (ref 22–32)
CREATININE: 0.78 mg/dL (ref 0.44–1.00)
GFR calc non Af Amer: >60 mL/min (ref >60)
Glucose, Bld: 193 mg/dL — ABNORMAL HIGH (ref 65–99)
Potassium: 3.9 mmol/L (ref 3.5–5.1)
SODIUM: 139 mmol/L (ref 135–145)

## 2015-12-28 LAB — GLUCOSE, CAPILLARY: GLUCOSE-CAPILLARY: 196 mg/dL — AB (ref 65–99)

## 2015-12-28 LAB — MAGNESIUM: MAGNESIUM: 1.8 mg/dL (ref 1.7–2.4)

## 2015-12-28 MED ORDER — OXYCODONE-ACETAMINOPHEN 5-325 MG PO TABS: 1.0000 | ORAL_TABLET | ORAL | Status: AC | PRN

## 2015-12-28 MED ORDER — IPRATROPIUM-ALBUTEROL 0.5-2.5 (3) MG/3ML IN SOLN
3.0000 mL | Freq: Three times a day (TID) | RESPIRATORY_TRACT | Status: DC
  Administered 2015-12-28: 3 mL via RESPIRATORY_TRACT
  Filled 2015-12-28: qty 3

## 2015-12-28 MED ORDER — TORSEMIDE 20 MG PO TABS: 20.0000 mg | ORAL_TABLET | Freq: Two times a day (BID) | ORAL | Status: AC

## 2015-12-28 NOTE — Progress Notes (Signed)
Clinical Social Worker (CSW) met with patient to get bed choice. Patient requested for CSW to call her son. CSW contacted patient's son [name]. Per son he prefers [employer] over [employer]. Son inquired about [employer]. CSW made son aware that [facility] admissions coordinator at [facility] asked the administrator to review referral however declined patient's referral. Son is in agreement with patient discharging to [place] today.   Patient is medically stable for D/C to [place] today. Per [employer] Sales liaison patient can come today. RN will call report and arrange EMS for transport. CSW sent updated D/C Summary, D/C packet and FL2 to Tammy via [facility]. Zack approved a 30 day LOG in case patient does not stay the entire 30 days. Please reconsult if future social work needs arise. CSW signing off.   Blima Rich, [place] [phone]

## 2015-12-28 NOTE — Progress Notes (Signed)
[facility] NAME: Flornce Record    MR#:  [id number]  DATE OF BIRTH:  05-10-1956  SUBJECTIVE:   Sob improving uop 15 liters since admsision REVIEW OF SYSTEMS:   Review of Systems  Constitutional: Negative for fever, chills and weight loss.  HENT: Negative for ear discharge, ear pain and nosebleeds.   Eyes: Negative for blurred vision, pain and discharge.  Respiratory: Positive for cough and shortness of breath. Negative for sputum production, wheezing and stridor.   Cardiovascular: Negative for chest pain, palpitations, orthopnea and PND.  Gastrointestinal: Negative for nausea, vomiting, abdominal pain and diarrhea.  Genitourinary: Negative for urgency and frequency.  Musculoskeletal: Negative for back pain and joint pain.  Neurological: Positive for weakness. Negative for sensory change, speech change and focal weakness.  Psychiatric/Behavioral: Negative for depression and hallucinations. The patient is not nervous/anxious.   All other systems reviewed and are negative.   Tolerating PT:yes Tolerating diet: yes  DRUG ALLERGIES:   Allergies  Allergen Reactions  . Penicillins Shortness Of Breath and Other (See Comments)    Has patient had a PCN reaction causing immediate rash, facial/tongue/throat swelling, SOB or lightheadedness with hypotension: Yes Has patient had a PCN reaction causing severe rash involving mucus membranes or skin necrosis: No Has patient had a PCN reaction that required hospitalization No Has patient had a PCN reaction occurring within the last 10 years: No If all of the above answers are "NO", then may proceed with Cephalosporin use.    VITALS:  Blood pressure 168/79, pulse 87, temperature 98.2 F (36.8 C), temperature source Oral, resp. rate 18, height '5\' 5"'$  (1.651 m), weight 122.108 kg (269 lb 3.2 oz), SpO2 95 %.  PHYSICAL EXAMINATION:  GENERAL:  60 y.o.-year-old patient lying in the bed with  no acute distress. obese EYES: Pupils equal, round, reactive to light and accommodation. No scleral icterus. Extraocular muscles intact.  HEENT: Head atraumatic, normocephalic. Oropharynx and nasopharynx clear.  NECK:  Supple, no jugular venous distention. No thyroid enlargement, no tenderness.  LUNGS: Normal breath sounds bilaterally, no wheezing, rales,rhonchi or crepitation. No use of accessory muscles of respiration. Bruise over the the port right upper chest CARDIOVASCULAR: S1, S2 normal. No murmurs, rubs, or gallops.  ABDOMEN: Soft, nontender, nondistended. Bowel sounds present. No organomegaly or mass.  EXTREMITIES: + pedal edema, cyanosis, or clubbing.  NEUROLOGIC: Cranial nerves II through XII are intact. Muscle strength 5/5 in all extremities. Sensation intact. Gait not checked.  PSYCHIATRIC: The patient is alert and oriented x 3.  SKIN: No obvious rash, lesion, or ulcer.    LABORATORY PANEL:   CBC  Recent Labs Lab 12/23/15 2202 12/25/15 0612 12/26/15 0101 12/26/15 0452  WBC 5.1 3.6  --   --   HGB 7.2* 6.9* 7.5* 7.7*  HCT 23.5* 23.2*  --   --   PLT 66* 54*  --   --    ------------------------------------------------------------------------------------------------------------------  Chemistries   Recent Labs Lab 12/23/15 2202 12/25/15 0612  12/27/15 1350 12/28/15 0457  NA 139 143  --   --  139  K 4.5 3.1*  < > 2.7* 3.9  CL 106 102  --   --  94*  CO2 25 30  --   --  37*  GLUCOSE 227* 150*  --   --  193*  BUN 26* 22*  --   --  25*  CREATININE 0.81 0.69  --   --  0.78  CALCIUM 7.8*  7.6*  --   --  7.1*  MG  --   --   --  1.5* 1.8  AST 52*  --   --   --   --   ALT 63*  --   --   --   --   ALKPHOS 277*  --   --   --   --   BILITOT 0.6  --   --   --   --   < > = values in this interval not displayed. ------------------------------------------------------------------------------------------------------------------  Cardiac Enzymes  Recent Labs Lab  12/24/15 0836 12/24/15 1433  TROPONINI <0.03 <0.03   ------------------------------------------------------------------------------------------------------------------  RADIOLOGY:  No results found.   ASSESSMENT AND PLAN:   Edina Winningham is a 60 y.o. 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 Patient was brought in by EMS on BiPAP.   1. Acute on chronic hypoxic respiratory failure secondary to multiple etiologies acute flash pulmonary edema with underlying malignant hypertension, anemia, obstructive sleep apnea and underlying lung metastases -Continue telemetry monitoring  -IV Lasix 20 mg 3 times a day---> changed to by mouth torsemide 40 mg twice a day. -Urine output 15.4 liters since admission - appreciate cardiology Dr. Donivan Scull recommendations  Acute MI ruled out with negative cardiac enzymes -CPAP daily at bedtime patient intolerant refuses to wear  2. Acute on chronic congestive heart failure diastolic in the setting of malignant hypertension -Echo done in December 2016 showed EF of 55-60% -Continue diuresis and heart meds  3. Malignant hypertension -When necessary IV hydralazine -Continue clonidine, hydralazine dose is increased to 100 mg by mouth every 6 hours , labetalol; titrate as needed  -Added Imdur  4. 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 Discussed with vascular Dr. Delana Meyer regarding its accessibility. Advised to continue access as possible. Dr. Delana Meyer see patient in next few weeks. Per hipatient had some bleeding during port placement and needs to heal before further manipulation is done.  5. Type patient had 2 diabetes, insulin-requiring Continue home dose insulin Lantus and aspart along with sliding   6. acute on chronic anemia multifactorial with ongoing chemotherapy for neuroendocrine tumor  -Since patient was symptomatic with CHF she was transfused with 1 unit of blood transfusion  -Hemoglobin at present is 7.7.  -Consider IV iron at the cancer center.  -Started by mouth ferrous sulfate.   7. Hypokalemia secondary to excessive diuresis  we will replete IV and by mouth  8. Discharge planning -Physical therapy to see patient. -Patient was evaluated by home health prior to her coming to the hospital. This was set up during last hospitalization 12/14/2015. -Care management for discharge planning.    All the records are reviewed and case discussed with Care Management/Social Workerr. Management plans discussed with the patient, family and they are in agreement.  CODE STATUS: full  TOTAL TIME TAKING CARE OF THIS PATIENT30 minutes.   POSSIBLE D/C IN 1 DAYS, DEPENDING ON CLINICAL CONDITION.   Tallulah Hosman M.D on 12/28/2015 at 8:00  AM  Between 7am to 6pm - Pager - [phone]  After 6pm go to [email or web address] - password [email or web address] [facility]  Office  [phone]  CC: Primary care physician; Sharyne Peach, MD

## 2015-12-28 NOTE — Discharge Summary (Addendum)
[facility] NAME: Renee Wells    MR#:  [id number]  DATE OF BIRTH:  [birth year]  DATE OF ADMISSION:  12/23/2015 ADMITTING PHYSICIAN: Fritzi Mandes, MD  DATE OF DISCHARGE: 12/27/2015  PRIMARY CARE PHYSICIAN: Sharyne Peach, MD    ADMISSION DIAGNOSIS:  Respiratory distress [R06.00] SOB (shortness of breath) [R06.02] Acute on chronic congestive heart failure, unspecified congestive heart failure type ([place]) [I50.9]  DISCHARGE DIAGNOSIS:  Acute on chronic respiratory failure secondary to acute on chronic congestive heart failure diastolic improved Acute on chronic anemia status post 1 unit of blood transfusion Morbid obesity Metastatic pulmonary nodules secondary to neuroendocrine tumor with ongoing chemotherapy at the cancer center Malignant hypertension Hypokalemia-repleted SECONDARY DIAGNOSIS:   Past Medical History  Diagnosis Date  . Stroke [facility])     a. 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  . Malignant hypertension     a. Admitted 06/2015 and 11/2015;  b. 06/2015 nl aldosterone/renin activity;  c. Polypharmacy.  [name] HLD (hyperlipidemia)   . Morbid obesity ([place])   . Anasarca     a. 11/2015 Echo: EF 55-65%, triv TR/MR.  [name] Hypokalemia   . Type II diabetes mellitus ([place])     a. 11/2015 A1c 9.5.  . Metastatic cancer ([place])     a. 11/2015 CT abd/pelvis: large R hepatic lobe mass extending inf to pararenal space on R. Complete occlusion of R portal vein. L hepatic lobe lesion. Bilat LL pulm mets. Skeletal mets 2/ Fx of R iliac wing. Enhancing uterine fundal mass.  [name] History of CVA (cerebrovascular accident)   . Left-sided weakness   . IDA (iron deficiency anemia)   . Insomnia   . Cancer ([place])   . History of echocardiogram     a. 06/2015: EF: 60-65%; b. 11/2015: EF: 55-65%     HOSPITAL COURSE:  Renee Wells is a 60 y.o. female with a known history of malignant hypertension, diastolic congestive heart failure, chronic  respiratory failure on home oxygen secondary to bilateral multiple pulmonary metastatic disease, metastatic poorly differentiated neuroendocrine tumor undergoing chemotherapy who was recently admitted 1228 01/29/1930 with acute pulmonary bowel comes it back to the emergency room with similar symptoms of progressive increasing shortness of breath over the day. Patient was brought in by EMS on BiPAP.   1. Acute on chronic hypoxic respiratory failure secondary to multiple etiologies acute flash pulmonary edema with underlying malignant hypertension, anemia, obstructive sleep apnea and underlying lung metastases -Continue telemetry monitoring  -IV Lasix 20 mg 3 times a day---> changed to by mouth torsemide 20 mg twice a day. -Urine output 17.4 liters since admission - appreciate cardiology Dr. Donivan Scull recommendations  Acute MI ruled out with negative cardiac enzymes -CPAP daily at bedtime patient intolerant refuses to wear  2. Acute on chronic congestive heart failure diastolic in the setting of malignant hypertension -Echo done in December 2016 showed EF of 55-60% -Continue diuresis and heart meds  3. Malignant hypertension -When necessary IV hydralazine -Continue clonidine, hydralazine dose is increased to 100 mg by mouth every 6 hours , labetalol; titrate as needed  -Added Imdur  4. Metastatic pulmonary nodules bilaterally secondary to poorly differentiated neuroendocrine tumor With metastases to the liver as well -Patient follows at the cancer center as an outpatient after discharge  -Patient has an IV port which was placed a few weeks ago. Discussed with vascular Dr. Delana Meyer regarding its accessibility. Advised to continue access as possible. Dr. Delana Meyer see patient  in next few weeks. Per hipatient had some bleeding during port placement and needs to heal before further manipulation is done.  5. Type patient had 2 diabetes, insulin-requiring Continue home dose insulin Lantus and aspart  along with sliding   6. acute on chronic anemia multifactorial with ongoing chemotherapy for neuroendocrine tumor  -Since patient was symptomatic with CHF she was transfused with 1 unit of blood transfusion  -Hemoglobin at present is 7.7.  -Consider IV iron at the cancer center.  -Started by mouth ferrous sulfate.    7. Hypokalemia secondary to excessive diuresis   we will replete IV and by mouth  8. Discharge planning -Physical therapy to see patient. -Patient was evaluated by home health prior to her coming to the hospital. This was set up during last hospitalization 12/14/2015. -Care management for discharge planning. To rehab today  CODE STATUS: full  DVT Prophylaxis: SCD teds only due to thrombocytopenia CONSULTS OBTAINED:  Treatment Team:  Minna Merritts, MD Katha Cabal, MD  DRUG ALLERGIES:   Allergies  Allergen Reactions  . Penicillins Shortness Of Breath and Other (See Comments)    Has patient had a PCN reaction causing immediate rash, facial/tongue/throat swelling, SOB or lightheadedness with hypotension: Yes Has patient had a PCN reaction causing severe rash involving mucus membranes or skin necrosis: No Has patient had a PCN reaction that required hospitalization No Has patient had a PCN reaction occurring within the last 10 years: No If all of the above answers are "NO", then may proceed with Cephalosporin use.    DISCHARGE MEDICATIONS:   Current Discharge Medication List    START taking these medications   Details  ferrous sulfate 325 (65 FE) MG tablet Take 1 tablet (325 mg total) by mouth 3 (three) times daily with meals. Qty: 90 tablet, Refills: 3    isosorbide mononitrate (IMDUR) 60 MG 24 hr tablet Take 1 tablet (60 mg total) by mouth daily. Qty: 30 tablet, Refills: 3    torsemide (DEMADEX) 20 MG tablet Take 1 tablet (20 mg total) by mouth 2 (two) times daily. Qty: 60 tablet, Refills: 3      CONTINUE these medications which have CHANGED    Details  hydrALAZINE (APRESOLINE) 100 MG tablet Take 1 tablet (100 mg total) by mouth 4 (four) times daily. Qty: 120 tablet, Refills: 2      CONTINUE these medications which have NOT CHANGED   Details  aspirin 81 MG chewable tablet Chew 1 tablet (81 mg total) by mouth daily.    atorvastatin (LIPITOR) 20 MG tablet Take 20 mg by mouth at bedtime. Reported on 12/07/2015    cloNIDine (CATAPRES) 0.3 MG tablet Take 1 tablet (0.3 mg total) by mouth 2 (two) times daily. Qty: 60 tablet, Refills: 0    insulin aspart (NOVOLOG) 100 UNIT/ML injection Inject 16 Units into the skin 3 (three) times daily with meals. Qty: 10 mL, Refills: 1    insulin glargine (LANTUS) 100 UNIT/ML injection Inject 0.25 mLs (25 Units total) into the skin at bedtime. Qty: 10 mL, Refills: 1    labetalol (NORMODYNE) 300 MG tablet Take 1 tablet (300 mg total) by mouth 2 (two) times daily. Qty: 60 tablet, Refills: 0    magnesium oxide (MAG-OX) 400 (241.3 MG) MG tablet Take 1 tablet (400 mg total) by mouth daily. Qty: 30 tablet, Refills: 0    ondansetron (ZOFRAN) 8 MG tablet Take 1 tablet (8 mg total) by mouth every 8 (eight) hours as needed for nausea or  vomiting. Qty: 90 tablet, Refills: 0    oxyCODONE-acetaminophen (ROXICET) 5-325 MG tablet Take 1 tablet by mouth every 4 (four) hours as needed for severe pain. Qty: 30 tablet, Refills: 0    potassium chloride SA (K-DUR,KLOR-CON) 20 MEQ tablet Take 2 tablets (40 mEq total) by mouth 2 (two) times daily. Qty: 60 tablet, Refills: 0      STOP taking these medications     furosemide (LASIX) 40 MG tablet         If you experience worsening of your admission symptoms, develop shortness of breath, life threatening emergency, suicidal or homicidal thoughts you must seek medical attention immediately by calling 911 or calling your MD immediately  if symptoms less severe.  You Must read complete instructions/literature along with all the possible adverse reactions/side  effects for all the Medicines you take and that have been prescribed to you. Take any new Medicines after you have completely understood and accept all the possible adverse reactions/side effects.   Please note  You were cared for by a hospitalist during your hospital stay. If you have any questions about your discharge medications or the care you received while you were in the hospital after you are discharged, you can call the unit and asked to speak with the hospitalist on call if the hospitalist that took care of you is not available. Once you are discharged, your primary care physician will handle any further medical issues. Please note that NO REFILLS for any discharge medications will be authorized once you are discharged, as it is imperative that you return to your primary care physician (or establish a relationship with a primary care physician if you do not have one) for your aftercare needs so that they can reassess your need for medications and monitor your lab values. Today   SUBJECTIVE   improving slowly. No complaints.   VITAL SIGNS:  Blood pressure 168/79, pulse 87, temperature 98.2 F (36.8 C), temperature source Oral, resp. rate 18, height '5\' 5"'$  (1.651 m), weight 122.108 kg (269 lb 3.2 oz), SpO2 95 %.  I/O:    Intake/Output Summary (Last 24 hours) at 12/28/15 0803 Last data filed at 12/28/15 0727  Gross per 24 hour  Intake      0 ml  Output   1850 ml  Net  -1850 ml    PHYSICAL EXAMINATION:  GENERAL:  60 y.o.-year-old patient lying in the bed with no acute distress.  morbidly obese a  EYES: Pupils equal, round, reactive to light and accommodation. No scleral icterus. Extraocular muscles intact.  HEENT: Head atraumatic, normocephalic. Oropharynx and nasopharynx clear.  NECK:  Supple, no jugular venous distention. No thyroid enlargement, no tenderness.  LUNGS: distant  breath sounds bilaterally, no wheezing, rales,rhonchi or crepitation. No use of accessory muscles of  respiration.  CARDIOVASCULAR: S1, S2 normal. No murmurs, rubs, or gallops.  ABDOMEN: Soft, non-tender, non-distended. Bowel sounds present. No organomegaly or mass.  EXTREMITIES: 1+ pedal edema, cyanosis, or clubbing.  NEUROLOGIC: Cranial nerves II through XII are intact. Muscle strength 5/5 in all extremities. Sensation intact. Gait not checked.  PSYCHIATRIC: The patient is alert and oriented x 3.  SKIN: No obvious rash, lesion, or ulcer.   DATA REVIEW:   CBC   Recent Labs Lab 12/25/15 0612  12/26/15 0452  WBC 3.6  --   --   HGB 6.9*  < > 7.7*  HCT 23.2*  --   --   PLT 54*  --   --   < > =  values in this interval not displayed.  Chemistries   Recent Labs Lab 12/23/15 2202  12/28/15 0457  NA 139  < > 139  K 4.5  < > 3.9  CL 106  < > 94*  CO2 25  < > 37*  GLUCOSE 227*  < > 193*  BUN 26*  < > 25*  CREATININE 0.81  < > 0.78  CALCIUM 7.8*  < > 7.1*  MG  --   < > 1.8  AST 52*  --   --   ALT 63*  --   --   ALKPHOS 277*  --   --   BILITOT 0.6  --   --   < > = values in this interval not displayed.  Microbiology Results   Recent Results (from the past 240 hour(s))  Culture, blood (routine x 2)     Status: None (Preliminary result)   Collection Time: 12/23/15 10:03 PM  Result Value Ref Range Status   Specimen Description BLOOD  Final   Special Requests NONE  Final   Culture NO GROWTH 4 DAYS  Final   Report Status PENDING  Incomplete  Urine culture     Status: None   Collection Time: 12/23/15 11:09 PM  Result Value Ref Range Status   Specimen Description URINE, RANDOM  Final   Special Requests NONE  Final   Culture NO GROWTH 2 DAYS  Final   Report Status 12/25/2015 FINAL  Final  Culture, blood (routine x 2)     Status: None (Preliminary result)   Collection Time: 12/23/15 11:12 PM  Result Value Ref Range Status   Specimen Description BLOOD BLOOD RIGHT FOREARM  Final   Special Requests BOTTLES DRAWN AEROBIC AND ANAEROBIC 5ML  Final   Culture NO GROWTH 4 DAYS   Final   Report Status PENDING  Incomplete    RADIOLOGY:  No results found.   Management plans discussed with the patient, family and they are in agreement.  CODE STATUS:     Code Status Orders        Start     Ordered   12/24/15 0240  Full code   Continuous     12/24/15 0239      TOTAL TIME TAKING CARE OF THIS PATIENT: 40 minutes.    Abdel Effinger M.D on 12/28/2015 at 8:03 AM  Between 7am to 6pm - Pager - [phone] After 6pm go to [email or web address] - password [email or web address] [facility]  Office  [phone]  CC: Primary care physician; Sharyne Peach, MD

## 2015-12-28 NOTE — Progress Notes (Signed)
    Patient to be discharged today on torsemide 20 mg bid and KCl repletion. Will have her follow up with our office within the next week. She has an appointment with the [facility] on 1/11. She will need to follow up with her PCP and oncology regarding her multiple medical issues. Recommend diet compliance.

## 2015-12-28 NOTE — Progress Notes (Signed)
Patient ID: Renee Wells, female   DOB: 08/27/1956, 60 y.o.   MRN: [id number]    Facility:  [facility]      Allergies  Allergen Reactions  . Penicillins Shortness Of Breath and Other (See Comments)    Has patient had a PCN reaction causing immediate rash, facial/tongue/throat swelling, SOB or lightheadedness with hypotension: Yes Has patient had a PCN reaction causing severe rash involving mucus membranes or skin necrosis: No Has patient had a PCN reaction that required hospitalization No Has patient had a PCN reaction occurring within the last 10 years: No If all of the above answers are "NO", then may proceed with Cephalosporin use.    Chief Complaint  Patient presents with  . Discharge Note    HPI:  [facility] to this facility. She is wanting to go home. She and her family tells me that she did not want to go to rehab; but was sent to this facility. She states that she will go home today no matter what. She will need her prescriptions to be written for her. Her family tells me that she will not need any dme and that she was on 02 at home. She will need to setup her own follow up appointments.  She had been hospitalized for acute on chronic respiratory failure; metastatic pulmonary nodule secondary to neuroendocrine tumor.    Past Medical History  Diagnosis Date  . Stroke [facility])     a. 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  . Malignant hypertension     a. Admitted 06/2015 and 11/2015;  b. 06/2015 nl aldosterone/renin activity;  c. Polypharmacy.  [name] HLD (hyperlipidemia)   . Morbid obesity ([facility]e [facility])   . Anasarca     a. 11/2015 Echo: EF 55-65%, triv TR/MR.  [name] Hypokalemia   . Type II diabetes mellitus (Renee [facility])     a. 11/2015 A1c 9.5.  . Metastatic cancer ([facility]e [facility])     a. 11/2015 CT abd/pelvis: large R hepatic lobe mass extending inf to pararenal space on R. Complete occlusion of R portal vein. L hepatic lobe lesion. Bilat LL pulm mets. Skeletal mets 2/  Fx of R iliac wing. Enhancing uterine fundal mass.  [name] History of CVA (cerebrovascular accident)   . Left-sided weakness   . IDA (iron deficiency anemia)   . Insomnia   . Cancer (Renee [facility])   . History of echocardiogram     a. 06/2015: EF: 60-65%; b. 11/2015: EF: 55-65%     Past Surgical History  Procedure Laterality Date  . Cesarean section    . Peripheral vascular catheterization N/A 12/10/2015    Procedure: Glori Luis Cath Insertion;  Surgeon: Katha Cabal, MD;  Location: [facility];  Service: Cardiovascular;  Laterality: N/A;    VITAL SIGNS BP 160/80 mmHg  Pulse 90  Ht '5\' 5"'$  (1.651 m)  Wt 269 lb (122.018 kg)  BMI 44.76 kg/m2  Patient's Medications  New Prescriptions   No medications on file  Previous Medications   ASPIRIN 81 MG CHEWABLE TABLET    Chew 1 tablet (81 mg total) by mouth daily.   ATORVASTATIN (LIPITOR) 20 MG TABLET    Take 20 mg by mouth at bedtime. Reported on 12/07/2015   CLONIDINE (CATAPRES) 0.3 MG TABLET    Take 1 tablet (0.3 mg total) by mouth 2 (two) times daily.   FERROUS SULFATE 325 (65 FE) MG TABLET    Take 1 tablet (325 mg total) by mouth 3 (three)  times daily with meals.   HYDRALAZINE (APRESOLINE) 100 MG TABLET    Take 1 tablet (100 mg total) by mouth 4 (four) times daily.   INSULIN ASPART (NOVOLOG) 100 UNIT/ML INJECTION    Inject 16 Units into the skin 3 (three) times daily with meals.   INSULIN GLARGINE (LANTUS) 100 UNIT/ML INJECTION    Inject 0.25 mLs (25 Units total) into the skin at bedtime.   ISOSORBIDE MONONITRATE (IMDUR) 60 MG 24 HR TABLET    Take 1 tablet (60 mg total) by mouth daily.   LABETALOL (NORMODYNE) 300 MG TABLET    Take 1 tablet (300 mg total) by mouth 2 (two) times daily.   MAGNESIUM OXIDE (MAG-OX) 400 (241.3 MG) MG TABLET    Take 1 tablet (400 mg total) by mouth daily.   ONDANSETRON (ZOFRAN) 8 MG TABLET    Take 1 tablet (8 mg total) by mouth every 8 (eight) hours as needed for nausea or vomiting.   OXYCODONE-ACETAMINOPHEN  (ROXICET) 5-325 MG TABLET    Take 1 tablet by mouth every 4 (four) hours as needed for severe pain.   POTASSIUM CHLORIDE SA (K-DUR,KLOR-CON) 20 MEQ TABLET    Take 2 tablets (40 mEq total) by mouth 2 (two) times daily.   TORSEMIDE (DEMADEX) 20 MG TABLET    Take 1 tablet (20 mg total) by mouth 2 (two) times daily.  Modified Medications   No medications on file  Discontinued Medications   No medications on file     SIGNIFICANT DIAGNOSTIC EXAMS   LABS REVIEWED:   12-28-15: glucose 193; bun 25; creat 0.78; k+ 3.9; na++139    Review of Systems  Unable to perform ROS: other  she declined ROS; states staff is upsetting her   Physical Exam  Constitutional: She is oriented to person, place, and time. No distress.  Obese   Eyes: Conjunctivae are normal.  Neck: Neck supple. No JVD present. No thyromegaly present.  Cardiovascular: Normal rate, regular rhythm and intact distal pulses.   Respiratory: Effort normal. No respiratory distress. She has no wheezes.  Breath sounds diminished 02 dependent   GI: Soft. Bowel sounds are normal. She exhibits no distension. There is no tenderness.  Musculoskeletal:  Able to move all extremities  Is very weak  Has bilateral lower extremity edema   Lymphadenopathy:    She has no cervical adenopathy.  Neurological: She is alert and oriented to person, place, and time.  Skin: Skin is warm and dry. She is not diaphoretic.  Psychiatric: She has a normal mood and affect.     ASSESSMENT/ PLAN:  Will discharge her to home for pt/ot/rn to evaluate and treat as indicated for gait; balance; strength; adl training and medication management. She will not need dme. She is presently on long term home 82. Her prescriptions have been written for a 30 day supply with #30 percocet 5/325 mg tabs. She is to follow up with her pcp and will need to make those arrangements    Time spent with patient  45   minutes >50% time spent counseling; reviewing medical record;  tests; labs; and developing future plan of care   Renee Edwards NP [facility]  Contact [phone] Monday through Friday 8am- 5pm  After hours call [phone]

## 2015-12-28 NOTE — Care Management (Signed)
Informed that when patient arrived at skilled nursing facility- she talked about leaving and going home.  Notified [place] with [facility] of potential of patient to return home.

## 2015-12-28 NOTE — Progress Notes (Signed)
Report given to [employer] from [facility]

## 2015-12-28 NOTE — Telephone Encounter (Signed)
lmov on home/cell number for pt to call back and make TCM apt within a week to 10 days from 12/28/15

## 2015-12-28 NOTE — Clinical Social Work Placement (Signed)
   CLINICAL SOCIAL WORK PLACEMENT  NOTE  Date:  12/28/2015  Patient Details  Name: Renee Wells MRN: [id number] Date of Birth: [birth year]  Clinical Social Work is seeking post-discharge placement for this patient at the [place] level of care (*CSW will initial, date and re-position this form in  chart as items are completed):  Yes   Patient/family provided with [place] Work Department's list of facilities offering this level of care within the geographic area requested by the patient (or if unable, by the patient's family).  Yes   Patient/family informed of their freedom to choose among providers that offer the needed level of care, that participate in Medicare, Medicaid or managed care program needed by the patient, have an available bed and are willing to accept the patient.  Yes   Patient/family informed of [facility]'s ownership interest in [facility] and [facility], as well as of the fact that they are under no obligation to receive care at these facilities.  PASRR submitted to EDS on 12/26/15     PASRR number received on 12/26/15     Existing PASRR number confirmed on       FL2 transmitted to all facilities in geographic area requested by pt/family on 12/26/15     FL2 transmitted to all facilities within larger geographic area on       Patient informed that his/her managed care company has contracts with or will negotiate with certain facilities, including the following:        Yes   Patient/family informed of bed offers received.  Patient chooses bed at  [facility] )     Physician recommends and patient chooses bed at      Patient to be transferred to  [facility]) on 12/28/15.  Patient to be transferred to facility by  [facility] )     Patient family notified on 12/28/15 of transfer.  Name of family member notified:   (Patient's son [name] is aware of D/C today. )     PHYSICIAN       Additional Comment:     _______________________________________________ Loralyn Freshwater, LCSW 12/28/2015, 9:16 AM

## 2015-12-28 NOTE — Progress Notes (Signed)
Assessment completed. Pt aaox3 . Notified pt she will be d/c today. HOB up and repositioned in bed. Noted hematoma to right hand. Pt states this is from a previous fall before being admitted to hospital. Bruise noted  To  left foot . Pt states she got this bruise from home when she and her son were trying to move around. No complaints at this moment. Will continue to monitor

## 2015-12-28 NOTE — Progress Notes (Signed)
Clinical Social Worker (CSW) received call from patient's daughter Enid Skeens [phone]. Per daughter she lives in [place] and asked CSW why [facility] and [facility] were patient's only bed offers. CSW explained to daughter that Medicaid beds are limited and those are the only 2 facilities that offered. CSW explained that patient can chose to go home if she does not want to go to those facilities. Daughter verbalized her understanding. Daughter also inquired long term care and chemo. CSW explained long term care options and that patient will have to talk with her doctor about chemo.   CSW received call back from [facility] stating that patient refused to stay at [facility] and left the facility.    Blima Rich, [place] [phone]

## 2015-12-28 NOTE — Progress Notes (Signed)
MEDICATION RELATED CONSULT NOTE - INITIAL   Pharmacy Consult for electrolyte management Indication: hypokalemia  Vital Signs: Temp: 98.2 F (36.8 C) (01/10 0408) Temp Source: Oral (01/10 0408) BP: 168/79 mmHg (01/10 0408) Pulse Rate: 87 (01/10 0408) Intake/Output from previous day: 01/09 0701 - 01/10 0700 In: 0  Out: 1100 [Urine:1100] Intake/Output from this shift: Total I/O In: -  Out: 1100 [Urine:1100]  Labs:  Recent Labs  12/25/15 0612 12/26/15 0101 12/26/15 0452 12/27/15 1350 12/28/15 0457  WBC 3.6  --   --   --   --   HGB 6.9* 7.5* 7.7*  --   --   HCT 23.2*  --   --   --   --   PLT 54*  --   --   --   --   CREATININE 0.69  --   --   --  0.78  MG  --   --   --  1.5* 1.8   Estimated Creatinine Clearance: 96.8 mL/min (by C-G formula based on Cr of 0.78).  Assessment: Pharmacy consulted to manage electrolytes (specifically potassium) in this 60 year old female. Patient has had hypokalemia for several days now despite aggressive repletion.   Repletion history: Date K level  Replacement 1/7 3.1  120 mEq PO K-Dur 1/8 2.4  40 mEq IV KCl & 120 mEq PO K-Dur 1/9  2.7 ('@1400'$ ) 10 mEq IV KCl & 110 mEq PO K-Dur  Patient's potassium remains low at 2.7 mmol/L this afternoon despite aggressive repletion. Thus far today, patient has received 10 mEq IV KCl and 110 mEq PO K-Dur, for a total of 120 mEq potassium replacement. There are multiple factors that could be contributing to hypokalemia: patient is currently prescribed torsemide 40 mg PO BID with a urine output of 2900 mL, patient has elevated blood glucose and has received 27 units of correctional insulin in the past 24 hours (in addition to home regimen of lantus 25 units qHS and aspart 16 units TID with meals), and patient is on sodium restricted diet but not carb restricted diet.  Magnesium level was ordered and is low at 1.5 mg/dL despite taking mag ox 400 mg PO daily.   Patient is currently receiving K-Dur 40 mEq TID  scheduled, with 2 doses remaining to be given today. These doses will bring patient to a total of 200 mEq K replacement, which is maximum dose. Per MD, patient can only tolerate PO replacement & IV replacement is not an option.  Goal of Therapy:  Electrolytes within normal limits Mg 1.7 - 2.4 mg/dL K 3.5 - 5.1 mmol/dL  Plan:  Replace magnesium with a 2 g IV one time dose to be given now  Per discussion with MD, torsemide 40 mg dose will be held tonight and patient's diet will be changed to carb restricted diet. This should help alleviate K loss with urine output and also reduce insulin requirements since insulin can lower potassium. Replacing magnesium should also help increase K absorption.   Patient is scheduled to receive two more doses of K-Dur 40 mEq today, bringing total replacement to 200 mEq.   Will recheck K and Mg with AM labs tomorrow. Pharmacy will continue to monitor, thank you for the consult.  01/10 K and Mg WNL. Recheck BMP with Mg tomorrow AM.  Renee Wells S 12/28/2015,6:05 AM

## 2015-12-29 ENCOUNTER — Telehealth: Payer: Self-pay | Admitting: Family

## 2015-12-29 ENCOUNTER — Ambulatory Visit: Payer: Medicaid Other | Admitting: Family

## 2015-12-29 NOTE — Telephone Encounter (Signed)
Patient did not show for her initial appointment at the [facility] on 12/29/15. Will attempt to reschedule.

## 2015-12-31 ENCOUNTER — Telehealth: Payer: Self-pay | Admitting: *Deleted

## 2015-12-31 NOTE — Telephone Encounter (Signed)
I spoke to pt's son that I had been informed that pt had been in hosp, twice and was d/c to [facility] in [place].  i was calling to see if they wanted to start back with treatment regimen and wanted to know if I should put her on sch. Next week.  He states he believes that his mom will want to get treatment but not next week and will speak to his mom at SNF and then will call me and let me know if she still wants to pursue it and when she wants to start back.  I gave him my direct number to call.

## 2016-01-01 ENCOUNTER — Emergency Department: Payer: Medicaid Other

## 2016-01-01 ENCOUNTER — Inpatient Hospital Stay
Admission: EM | Admit: 2016-01-01 | Discharge: 2016-01-19 | DRG: 309 | Disposition: E | Payer: Medicaid Other | Attending: Internal Medicine | Admitting: Internal Medicine

## 2016-01-01 DIAGNOSIS — C3490 Malignant neoplasm of unspecified part of unspecified bronchus or lung: Secondary | ICD-10-CM | POA: Diagnosis present

## 2016-01-01 DIAGNOSIS — Z794 Long term (current) use of insulin: Secondary | ICD-10-CM | POA: Diagnosis not present

## 2016-01-01 DIAGNOSIS — S42201A Unspecified fracture of upper end of right humerus, initial encounter for closed fracture: Secondary | ICD-10-CM | POA: Diagnosis present

## 2016-01-01 DIAGNOSIS — Z7982 Long term (current) use of aspirin: Secondary | ICD-10-CM

## 2016-01-01 DIAGNOSIS — I499 Cardiac arrhythmia, unspecified: Secondary | ICD-10-CM | POA: Diagnosis present

## 2016-01-01 DIAGNOSIS — D709 Neutropenia, unspecified: Secondary | ICD-10-CM | POA: Diagnosis present

## 2016-01-01 DIAGNOSIS — C7951 Secondary malignant neoplasm of bone: Secondary | ICD-10-CM | POA: Diagnosis present

## 2016-01-01 DIAGNOSIS — G893 Neoplasm related pain (acute) (chronic): Secondary | ICD-10-CM | POA: Diagnosis present

## 2016-01-01 DIAGNOSIS — E875 Hyperkalemia: Secondary | ICD-10-CM

## 2016-01-01 DIAGNOSIS — Z6841 Body Mass Index (BMI) 40.0 and over, adult: Secondary | ICD-10-CM | POA: Diagnosis not present

## 2016-01-01 DIAGNOSIS — E11649 Type 2 diabetes mellitus with hypoglycemia without coma: Secondary | ICD-10-CM | POA: Diagnosis present

## 2016-01-01 DIAGNOSIS — C7800 Secondary malignant neoplasm of unspecified lung: Secondary | ICD-10-CM | POA: Diagnosis present

## 2016-01-01 DIAGNOSIS — Z8249 Family history of ischemic heart disease and other diseases of the circulatory system: Secondary | ICD-10-CM

## 2016-01-01 DIAGNOSIS — R52 Pain, unspecified: Secondary | ICD-10-CM

## 2016-01-01 DIAGNOSIS — N179 Acute kidney failure, unspecified: Secondary | ICD-10-CM | POA: Diagnosis present

## 2016-01-01 DIAGNOSIS — I469 Cardiac arrest, cause unspecified: Secondary | ICD-10-CM | POA: Diagnosis not present

## 2016-01-01 DIAGNOSIS — E119 Type 2 diabetes mellitus without complications: Secondary | ICD-10-CM | POA: Diagnosis present

## 2016-01-01 DIAGNOSIS — I472 Ventricular tachycardia: Secondary | ICD-10-CM | POA: Diagnosis present

## 2016-01-01 DIAGNOSIS — Z8673 Personal history of transient ischemic attack (TIA), and cerebral infarction without residual deficits: Secondary | ICD-10-CM

## 2016-01-01 DIAGNOSIS — I10 Essential (primary) hypertension: Secondary | ICD-10-CM | POA: Diagnosis present

## 2016-01-01 DIAGNOSIS — I4729 Other ventricular tachycardia: Secondary | ICD-10-CM

## 2016-01-01 DIAGNOSIS — E785 Hyperlipidemia, unspecified: Secondary | ICD-10-CM | POA: Diagnosis present

## 2016-01-01 DIAGNOSIS — Z9221 Personal history of antineoplastic chemotherapy: Secondary | ICD-10-CM | POA: Diagnosis not present

## 2016-01-01 DIAGNOSIS — E878 Other disorders of electrolyte and fluid balance, not elsewhere classified: Secondary | ICD-10-CM | POA: Diagnosis present

## 2016-01-01 DIAGNOSIS — M25551 Pain in right hip: Secondary | ICD-10-CM

## 2016-01-01 DIAGNOSIS — Z833 Family history of diabetes mellitus: Secondary | ICD-10-CM | POA: Diagnosis not present

## 2016-01-01 DIAGNOSIS — X58XXXA Exposure to other specified factors, initial encounter: Secondary | ICD-10-CM | POA: Diagnosis not present

## 2016-01-01 LAB — DIFFERENTIAL
BASOS ABS: 0 10*3/uL (ref 0–0.1)
BASOS PCT: 1 %
BLASTS: 0 %
Band Neutrophils: 0 %
Eosinophils Absolute: 0 10*3/uL (ref 0–0.7)
Eosinophils Relative: 2 %
LYMPHS ABS: 0.2 10*3/uL — AB (ref 1.0–3.6)
LYMPHS PCT: 11 %
MONOS PCT: 4 %
Metamyelocytes Relative: 0 %
Monocytes Absolute: 0.1 10*3/uL — ABNORMAL LOW (ref 0.2–0.9)
Myelocytes: 0 %
NEUTROS PCT: 82 %
Neutro Abs: 1.1 10*3/uL — ABNORMAL LOW (ref 1.4–6.5)
Other: 0 %
PROMYELOCYTES ABS: 0 %
nRBC: 48 /100 WBC — ABNORMAL HIGH

## 2016-01-01 LAB — COMPREHENSIVE METABOLIC PANEL
ALT: 38 U/L (ref 14–54)
ANION GAP: 11 (ref 5–15)
AST: 61 U/L — ABNORMAL HIGH (ref 15–41)
Albumin: 2.5 g/dL — ABNORMAL LOW (ref 3.5–5.0)
Alkaline Phosphatase: 225 U/L — ABNORMAL HIGH (ref 38–126)
BUN: 44 mg/dL — ABNORMAL HIGH (ref 6–20)
CHLORIDE: 97 mmol/L — AB (ref 101–111)
CO2: 29 mmol/L (ref 22–32)
Calcium: 7.5 mg/dL — ABNORMAL LOW (ref 8.9–10.3)
Creatinine, Ser: 1.59 mg/dL — ABNORMAL HIGH (ref 0.44–1.00)
GFR, EST AFRICAN AMERICAN: 39 mL/min — AB (ref >60)
GFR, EST NON AFRICAN AMERICAN: 34 mL/min — AB (ref >60)
Glucose, Bld: 77 mg/dL (ref 65–99)
POTASSIUM: 5.5 mmol/L — AB (ref 3.5–5.1)
SODIUM: 137 mmol/L (ref 135–145)
Total Bilirubin: 1 mg/dL (ref 0.3–1.2)
Total Protein: 5.4 g/dL — ABNORMAL LOW (ref 6.5–8.1)

## 2016-01-01 LAB — CBC
HCT: 26.6 % — ABNORMAL LOW (ref 35.0–47.0)
Hemoglobin: 8.2 g/dL — ABNORMAL LOW (ref 12.0–16.0)
MCH: 23.5 pg — AB (ref 26.0–34.0)
MCHC: 30.6 g/dL — ABNORMAL LOW (ref 32.0–36.0)
MCV: 76.6 fL — AB (ref 80.0–100.0)
PLATELETS: 104 10*3/uL — AB (ref 150–440)
RBC: 3.48 MIL/uL — ABNORMAL LOW (ref 3.80–5.20)
RDW: 20.2 % — AB (ref 11.5–14.5)
WBC: 1.4 10*3/uL — CL (ref 3.6–11.0)

## 2016-01-01 LAB — GLUCOSE, CAPILLARY
Glucose-Capillary: 57 mg/dL — ABNORMAL LOW (ref 65–99)
Glucose-Capillary: 58 mg/dL — ABNORMAL LOW (ref 65–99)
Glucose-Capillary: 80 mg/dL (ref 65–99)

## 2016-01-01 LAB — BRAIN NATRIURETIC PEPTIDE: B NATRIURETIC PEPTIDE 5: 129 pg/mL — AB (ref 0.0–100.0)

## 2016-01-01 LAB — MAGNESIUM: MAGNESIUM: 1.9 mg/dL (ref 1.7–2.4)

## 2016-01-01 LAB — MRSA PCR SCREENING: MRSA by PCR: NEGATIVE

## 2016-01-01 LAB — HEMOGLOBIN A1C: Hgb A1c MFr Bld: 8.3 % — ABNORMAL HIGH (ref 4.0–6.0)

## 2016-01-01 LAB — TSH: TSH: 1.56 u[IU]/mL (ref 0.350–4.500)

## 2016-01-01 LAB — TROPONIN I: TROPONIN I: 0.04 ng/mL — AB (ref <0.031)

## 2016-01-01 LAB — PHOSPHORUS: Phosphorus: 3 mg/dL (ref 2.5–4.6)

## 2016-01-01 MED ORDER — ACETAMINOPHEN 325 MG PO TABS: 650.0000 mg | ORAL_TABLET | Freq: Four times a day (QID) | ORAL | Status: DC | PRN

## 2016-01-01 MED ORDER — FERROUS SULFATE 325 (65 FE) MG PO TABS: 325.0000 mg | ORAL_TABLET | Freq: Three times a day (TID) | ORAL | Status: DC

## 2016-01-01 MED ORDER — LABETALOL HCL 200 MG PO TABS: 300.0000 mg | ORAL_TABLET | Freq: Two times a day (BID) | ORAL | Status: DC

## 2016-01-01 MED ORDER — POTASSIUM CHLORIDE CRYS ER 20 MEQ PO TBCR: 40.0000 meq | EXTENDED_RELEASE_TABLET | Freq: Two times a day (BID) | ORAL | Status: DC

## 2016-01-01 MED ORDER — INSULIN REGULAR HUMAN 100 UNIT/ML IJ SOLN
10.0000 [IU] | Freq: Once | INTRAMUSCULAR | Status: AC
  Administered 2016-01-01: 10 [IU] via INTRAVENOUS
  Filled 2016-01-01: qty 0.1

## 2016-01-01 MED ORDER — CLONIDINE HCL 0.1 MG PO TABS: 0.3000 mg | ORAL_TABLET | Freq: Two times a day (BID) | ORAL | Status: DC

## 2016-01-01 MED ORDER — HYDRALAZINE HCL 50 MG PO TABS: 100.0000 mg | ORAL_TABLET | Freq: Four times a day (QID) | ORAL | Status: DC

## 2016-01-01 MED ORDER — SODIUM CHLORIDE 0.9 % IV BOLUS (SEPSIS)
1000.0000 mL | Freq: Once | INTRAVENOUS | Status: AC
Start: 2016-01-01 — End: 2016-01-01
  Administered 2016-01-01: 1000 mL via INTRAVENOUS

## 2016-01-01 MED ORDER — ISOSORBIDE MONONITRATE ER 30 MG PO TB24: 60.0000 mg | ORAL_TABLET | Freq: Every day | ORAL | Status: DC

## 2016-01-01 MED ORDER — MORPHINE SULFATE (PF) 4 MG/ML IV SOLN: 4.0000 mg | INTRAVENOUS | Status: DC | PRN

## 2016-01-01 MED ORDER — HYDROMORPHONE HCL 1 MG/ML IJ SOLN
1.0000 mg | Freq: Once | INTRAMUSCULAR | Status: AC
  Administered 2016-01-01: 1 mg via INTRAVENOUS
  Filled 2016-01-01: qty 1

## 2016-01-01 MED ORDER — NALOXONE HCL 2 MG/2ML IJ SOSY
PREFILLED_SYRINGE | INTRAMUSCULAR | Status: AC
  Administered 2016-01-01: 0.4 mg via INTRAVENOUS
  Filled 2016-01-01: qty 2

## 2016-01-01 MED ORDER — ATORVASTATIN CALCIUM 20 MG PO TABS: 20.0000 mg | ORAL_TABLET | Freq: Every day | ORAL | Status: DC

## 2016-01-01 MED ORDER — MORPHINE SULFATE (PF) 2 MG/ML IV SOLN
2.0000 mg | Freq: Once | INTRAVENOUS | Status: AC
  Administered 2016-01-01: 2 mg via INTRAVENOUS
  Filled 2016-01-01: qty 1

## 2016-01-01 MED ORDER — HEPARIN SODIUM (PORCINE) 5000 UNIT/ML IJ SOLN
5000.0000 [IU] | Freq: Three times a day (TID) | INTRAMUSCULAR | Status: DC
  Administered 2016-01-01: 5000 [IU] via SUBCUTANEOUS
  Filled 2016-01-01: qty 1

## 2016-01-01 MED ORDER — TORSEMIDE 20 MG PO TABS
20.0000 mg | ORAL_TABLET | Freq: Two times a day (BID) | ORAL | Status: DC
  Filled 2016-01-01 (×3): qty 1

## 2016-01-01 MED ORDER — NALOXONE HCL 2 MG/2ML IJ SOSY
0.4000 mg | PREFILLED_SYRINGE | Freq: Once | INTRAMUSCULAR | Status: AC
  Administered 2016-01-01: 0.4 mg via INTRAVENOUS

## 2016-01-01 MED ORDER — KETOROLAC TROMETHAMINE 30 MG/ML IJ SOLN
30.0000 mg | Freq: Once | INTRAMUSCULAR | Status: AC
  Administered 2016-01-01: 30 mg via INTRAVENOUS
  Filled 2016-01-01: qty 1

## 2016-01-01 MED ORDER — INSULIN ASPART 100 UNIT/ML ~~LOC~~ SOLN: 0.0000 [IU] | Freq: Three times a day (TID) | SUBCUTANEOUS | Status: DC

## 2016-01-01 MED ORDER — NALOXONE HCL 2 MG/2ML IJ SOSY
0.4000 mg | PREFILLED_SYRINGE | Freq: Once | INTRAMUSCULAR | Status: AC
  Administered 2016-01-01: 0.4 mg via INTRAVENOUS
  Administered 2016-01-01: 1 mg via INTRAVENOUS

## 2016-01-01 MED ORDER — SODIUM CHLORIDE 0.9 % IJ SOLN: 3.0000 mL | Freq: Two times a day (BID) | INTRAMUSCULAR | Status: DC

## 2016-01-01 MED ORDER — ALBUTEROL SULFATE (2.5 MG/3ML) 0.083% IN NEBU: 2.5000 mg | INHALATION_SOLUTION | Freq: Once | RESPIRATORY_TRACT | Status: DC

## 2016-01-01 MED ORDER — NALOXONE HCL 2 MG/2ML IJ SOSY
1.0000 mg | PREFILLED_SYRINGE | Freq: Once | INTRAMUSCULAR | Status: AC
  Administered 2016-01-01: 1 mg via INTRAVENOUS

## 2016-01-01 MED ORDER — DEXTROSE 50 % IV SOLN
25.0000 mL | Freq: Once | INTRAVENOUS | Status: AC
  Administered 2016-01-01 (×2): 25 mL via INTRAVENOUS
  Filled 2016-01-01: qty 50

## 2016-01-01 MED ORDER — ONDANSETRON HCL 4 MG PO TABS: 8.0000 mg | ORAL_TABLET | Freq: Three times a day (TID) | ORAL | Status: DC | PRN

## 2016-01-01 MED ORDER — SODIUM CHLORIDE 0.9 % IV SOLN
1.0000 g | Freq: Once | INTRAVENOUS | Status: AC
  Administered 2016-01-01: 1 g via INTRAVENOUS
  Filled 2016-01-01: qty 10

## 2016-01-01 MED ORDER — INSULIN GLARGINE 100 UNIT/ML ~~LOC~~ SOLN
28.0000 [IU] | Freq: Every day | SUBCUTANEOUS | Status: DC
  Filled 2016-01-01: qty 0.28

## 2016-01-01 MED ORDER — DOCUSATE SODIUM 100 MG PO CAPS: 100.0000 mg | ORAL_CAPSULE | Freq: Two times a day (BID) | ORAL | Status: DC

## 2016-01-01 MED ORDER — ONDANSETRON HCL 4 MG/2ML IJ SOLN: 4.0000 mg | Freq: Four times a day (QID) | INTRAMUSCULAR | Status: DC | PRN

## 2016-01-01 MED ORDER — ACETAMINOPHEN 650 MG RE SUPP: 650.0000 mg | Freq: Four times a day (QID) | RECTAL | Status: DC | PRN

## 2016-01-01 MED ORDER — SODIUM CHLORIDE 0.9 % IV SOLN
INTRAVENOUS | Status: DC
  Administered 2016-01-01: 05:00:00 via INTRAVENOUS

## 2016-01-01 MED ORDER — MAGNESIUM OXIDE 400 (241.3 MG) MG PO TABS: 400.0000 mg | ORAL_TABLET | Freq: Every day | ORAL | Status: DC

## 2016-01-01 MED ORDER — FUROSEMIDE 10 MG/ML IJ SOLN
80.0000 mg | Freq: Once | INTRAMUSCULAR | Status: DC
  Filled 2016-01-01: qty 8

## 2016-01-01 MED ORDER — NALOXONE HCL 2 MG/2ML IJ SOSY: 0.4000 mg | PREFILLED_SYRINGE | Freq: Once | INTRAMUSCULAR | Status: DC

## 2016-01-01 MED ORDER — ASPIRIN 81 MG PO CHEW: 81.0000 mg | CHEWABLE_TABLET | Freq: Every day | ORAL | Status: DC

## 2016-01-01 MED ORDER — DEXTROSE 50 % IV SOLN
INTRAVENOUS | Status: AC
  Administered 2016-01-01: 25 mL via INTRAVENOUS
  Filled 2016-01-01: qty 50

## 2016-01-01 MED ORDER — ONDANSETRON HCL 4 MG PO TABS: 4.0000 mg | ORAL_TABLET | Freq: Four times a day (QID) | ORAL | Status: DC | PRN

## 2016-01-01 MED ORDER — NALOXONE HCL 2 MG/2ML IJ SOSY
PREFILLED_SYRINGE | INTRAMUSCULAR | Status: AC
  Administered 2016-01-01: 1 mg via INTRAVENOUS
  Filled 2016-01-01: qty 2

## 2016-01-01 MED ORDER — MAGNESIUM SULFATE IN D5W 10-5 MG/ML-% IV SOLN
1.0000 g | INTRAVENOUS | Status: AC
  Administered 2016-01-01: 1 g via INTRAVENOUS
  Filled 2016-01-01: qty 100

## 2016-01-04 MED FILL — Medication: Qty: 1 | Status: AC

## 2016-01-19 NOTE — ED Notes (Signed)
MD Kinner at bedside  

## 2016-01-19 NOTE — ED Notes (Signed)
Pt has a home prescription for oxycodone. Pt denies taking oxycodone. Pt's son is unsure if pt took any oxycodone.

## 2016-01-19 NOTE — ED Notes (Signed)
Pt's lasix held due to drop in pt's BP.

## 2016-01-19 NOTE — ED Notes (Signed)
Pt respirations beginning to normal. Pt not making normal verbalizations.

## 2016-01-19 NOTE — Progress Notes (Signed)
FBS back up to 80. Will continue to monitor.

## 2016-01-19 NOTE — ED Notes (Signed)
Patient reporting 10 out of 10 pain, on the right hip, abdomen and shoulder Pt requesting pain medications. Dr Corky Downs notified.

## 2016-01-19 NOTE — ED Notes (Signed)
[facility] Department of Emergency Medicine   Code Blue CONSULT NOTE  Chief Complaint: Cardiac arrest/unresponsive   Level V Caveat: Unresponsive  History of present illness: I was contacted by the hospital for a CODE BLUE cardiac arrest upstairs and presented to the patient's bedside.  Please see my ED HPI note as I saw this patient in the emergency department  ROS: Unable to obtain, Level V caveat  Scheduled Meds: . albuterol  2.5 mg Nebulization Once  . aspirin  81 mg Oral Daily  . atorvastatin  20 mg Oral QHS  . cloNIDine  0.3 mg Oral BID  . docusate sodium  100 mg Oral BID  . ferrous sulfate  325 mg Oral TID WC  . furosemide  80 mg Intravenous Once  . heparin  5,000 Units Subcutaneous 3 times per day  . hydrALAZINE  100 mg Oral QID  . insulin aspart  0-15 Units Subcutaneous TID WC  . insulin glargine  28 Units Subcutaneous QHS  . isosorbide mononitrate  60 mg Oral Daily  . labetalol  300 mg Oral BID  . magnesium oxide  400 mg Oral Daily  . potassium chloride SA  40 mEq Oral BID  . sodium chloride  3 mL Intravenous Q12H  . torsemide  20 mg Oral BID   Continuous Infusions: . sodium chloride 150 mL/hr at 01/12/2016 0515   PRN Meds:.acetaminophen **OR** acetaminophen, morphine injection, [DISCONTINUED] ondansetron **OR** ondansetron (ZOFRAN) IV, ondansetron Past Medical History  Diagnosis Date  . Stroke [facility])     a. 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  . Malignant hypertension     a. Admitted 06/2015 and 11/2015;  b. 06/2015 nl aldosterone/renin activity;  c. Polypharmacy.  [name] HLD (hyperlipidemia)   . Morbid obesity ([place])   . Anasarca     a. 11/2015 Echo: EF 55-65%, triv TR/MR.  [name] Hypokalemia   . Type II diabetes mellitus ([place])     a. 11/2015 A1c 9.5.  . Metastatic cancer ([place])     a. 11/2015 CT abd/pelvis: large R hepatic lobe mass extending inf to pararenal space on R. Complete occlusion of R portal vein. L hepatic lobe lesion. Bilat LL  pulm mets. Skeletal mets 2/ Fx of R iliac wing. Enhancing uterine fundal mass.  [name] History of CVA (cerebrovascular accident)   . Left-sided weakness   . IDA (iron deficiency anemia)   . Insomnia   . Cancer ([place])   . History of echocardiogram     a. 06/2015: EF: 60-65%; b. 11/2015: EF: 55-65%    Past Surgical History  Procedure Laterality Date  . Cesarean section    . Peripheral vascular catheterization N/A 12/10/2015    Procedure: Glori Luis Cath Insertion;  Surgeon: Katha Cabal, MD;  Location: [facility];  Service: Cardiovascular;  Laterality: N/A;   Social History   Social History  . Marital Status: Single    Spouse Name: N/A  . Number of Children: N/A  . Years of Education: N/A   Occupational History  . Not on file.   Social History Main Topics  . Smoking status: Never Smoker   . Smokeless tobacco: Never Used  . Alcohol Use: No     Comment: occasional  . Drug Use: No  . Sexual Activity: No   Other Topics Concern  . Not on file   Social History Narrative   Lives in [place] with son and his fiancee.  Up until a few wks ago, was able  to get around with cane, then moved to [place], and more recently w/c 2/2 progressive dyspnea and lower ext edema.   Allergies  Allergen Reactions  . Penicillins Shortness Of Breath and Other (See Comments)    Has patient had a PCN reaction causing immediate rash, facial/tongue/throat swelling, SOB or lightheadedness with hypotension: Yes Has patient had a PCN reaction causing severe rash involving mucus membranes or skin necrosis: No Has patient had a PCN reaction that required hospitalization No Has patient had a PCN reaction occurring within the last 10 years: No If all of the above answers are "NO", then may proceed with Cephalosporin use.    Last set of Vital Signs (not current) Filed Vitals:   2016-01-07 0500 2016/01/07 0600  BP: 102/56 78/48  Pulse: 111 112  Temp: 98.3 F (36.8 C)   Resp: 34 22      Physical  Exam  Gen: unresponsive Cardiovascular: pulseless  Resp: apneic. Breath sounds equal bilaterally with bagging  Abd: nondistended  Neuro: GCS 3, unresponsive to pain  HEENT: No blood in posterior pharynx, gag reflex absent  Neck: No crepitus  Musculoskeletal: No deformity  Skin: warm  Procedures  INTUBATION Performed by: Lavonia Drafts Required items: required blood products, implants, devices, and special equipment available Patient identity confirmed: provided demographic data and hospital-assigned identification number Time out: Immediately prior to procedure a "time out" was called to verify the correct patient, procedure, equipment, support staff and site/side marked as required. Indications: unresponsive Intubation method: glidescope Preoxygenation: BVM Sedatives: none Paralytic: none Tube Size: 7.5 cuffed Post-procedure assessment: chest rise and ETCO2 monitor Breath sounds: equal and absent over the epigastrium Tube secured by Respiratory Therapy     Cardiopulmonary Resuscitation (CPR) Procedure Note  Directed/Performed by: Lavonia Drafts I personally directed ancillary staff and/or performed CPR in an effort to regain return of spontaneous circulation and to maintain cardiac, neuro and systemic perfusion. This was turned over to Dr. Marcille Blanco when he arrived      Lavonia Drafts, MD Jan 07, 2016 [phone]

## 2016-01-19 NOTE — ED Notes (Signed)
Pt reports she cannot move right side. Pt is able to move right side - she successfully squeezed nurses hand, and dorsiflex/plantar flex right foot

## 2016-01-19 NOTE — ED Provider Notes (Signed)
[facility] Emergency Department Provider Note  ____________________________________________  Time seen: On arrival, via EMS  I have reviewed the triage vital signs and the nursing notes.   HISTORY  Chief Complaint Joint Pain    HPI Renee Wells is a 60 y.o. female with metastatic lung cancer who presents with severe 10 out of 10 pain in her right shoulder and right hip. Her son reports that she does have a history of bony metastases but she has never had pain in this area before. She denies a history of fever. She reports she woke up from a nap and had this pain. Patient was recently admitted for acute respiratory distress earlier this month. Patient reports she did not take her pain medication instead she came straight to the emergency department.     Past Medical History  Diagnosis Date  . Stroke [facility])     a. 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  . Malignant hypertension     a. Admitted 06/2015 and 11/2015;  b. 06/2015 nl aldosterone/renin activity;  c. Polypharmacy.  [name] HLD (hyperlipidemia)   . Morbid obesity ([place])   . Anasarca     a. 11/2015 Echo: EF 55-65%, triv TR/MR.  [name] Hypokalemia   . Type II diabetes mellitus ([place])     a. 11/2015 A1c 9.5.  . Metastatic cancer ([place])     a. 11/2015 CT abd/pelvis: large R hepatic lobe mass extending inf to pararenal space on R. Complete occlusion of R portal vein. L hepatic lobe lesion. Bilat LL pulm mets. Skeletal mets 2/ Fx of R iliac wing. Enhancing uterine fundal mass.  [name] History of CVA (cerebrovascular accident)   . Left-sided weakness   . IDA (iron deficiency anemia)   . Insomnia   . Cancer ([place])   . History of echocardiogram     a. 06/2015: EF: 60-65%; b. 11/2015: EF: 55-65%     Patient Active Problem List   Diagnosis Date Noted  . Acute on chronic diastolic CHF (congestive heart failure) ([place])   . Respiratory distress   . Metastatic lung cancer (metastasis from lung to other site) [facility])    . HTN (hypertension), malignant   . Absolute anemia   . Morbid obesity due to excess calories ([place])   . Pulmonary edema with congestive heart failure ([place]) 12/23/2015  . Acute on chronic diastolic CHF (congestive heart failure), NYHA class 1 ([place]) 12/15/2015  . Acute on chronic systolic congestive heart failure ([place])   . Acute diastolic CHF (congestive heart failure) ([place]) 11/30/2015  . Metastatic cancer ([place])   . Type II diabetes mellitus ([place])   . Anasarca   . Morbid obesity ([place])   . Malignant hypertension   . Dyspnea on exertion   . Liver mass   . Transaminitis   . Hyperglycemia 06/27/2015  . Obesity 06/27/2015  . Accelerated hypertension 06/26/2015  . Hypokalemia 06/26/2015  . HLD (hyperlipidemia) 06/26/2015  . Suspected CHF (congestive heart failure) 06/26/2015  . Bilateral lower extremity edema 06/26/2015    Past Surgical History  Procedure Laterality Date  . Cesarean section    . Peripheral vascular catheterization N/A 12/10/2015    Procedure: Glori Luis Cath Insertion;  Surgeon: Katha Cabal, MD;  Location: [facility];  Service: Cardiovascular;  Laterality: N/A;    Current Outpatient Rx  Name  Route  Sig  Dispense  Refill  . aspirin 81 MG chewable tablet   Oral   Chew 1 tablet (81 mg total)  by mouth daily.         [name] atorvastatin (LIPITOR) 20 MG tablet   Oral   Take 20 mg by mouth at bedtime. Reported on 12/07/2015         . cloNIDine (CATAPRES) 0.3 MG tablet   Oral   Take 1 tablet (0.3 mg total) by mouth 2 (two) times daily.   60 tablet   0   . ferrous sulfate 325 (65 FE) MG tablet   Oral   Take 1 tablet (325 mg total) by mouth 3 (three) times daily with meals.   90 tablet   3   . hydrALAZINE (APRESOLINE) 100 MG tablet   Oral   Take 1 tablet (100 mg total) by mouth 4 (four) times daily.   120 tablet   2   . insulin aspart (NOVOLOG) 100 UNIT/ML injection   Subcutaneous   Inject 16 Units into the skin 3 (three) times daily with  meals.   10 mL   1   . insulin glargine (LANTUS) 100 UNIT/ML injection   Subcutaneous   Inject 0.25 mLs (25 Units total) into the skin at bedtime.   10 mL   1   . isosorbide mononitrate (IMDUR) 60 MG 24 hr tablet   Oral   Take 1 tablet (60 mg total) by mouth daily.   30 tablet   3   . labetalol (NORMODYNE) 300 MG tablet   Oral   Take 1 tablet (300 mg total) by mouth 2 (two) times daily.   60 tablet   0   . magnesium oxide (MAG-OX) 400 (241.3 MG) MG tablet   Oral   Take 1 tablet (400 mg total) by mouth daily.   30 tablet   0   . ondansetron (ZOFRAN) 8 MG tablet   Oral   Take 1 tablet (8 mg total) by mouth every 8 (eight) hours as needed for nausea or vomiting.   90 tablet   0   . oxyCODONE-acetaminophen (ROXICET) 5-325 MG tablet   Oral   Take 1 tablet by mouth every 4 (four) hours as needed for severe pain.   30 tablet   0     This prescription will be given to patient upon di ...   . potassium chloride SA (K-DUR,KLOR-CON) 20 MEQ tablet   Oral   Take 2 tablets (40 mEq total) by mouth 2 (two) times daily.   60 tablet   0   . torsemide (DEMADEX) 20 MG tablet   Oral   Take 1 tablet (20 mg total) by mouth 2 (two) times daily.   60 tablet   3     Allergies Penicillins  Family History  Problem Relation Age of Onset  . Heart disease Mother   . Hypertension Mother   . Diabetes Mother   . Diabetes Sister   . Hyperlipidemia Sister     Social History Social History  Substance Use Topics  . Smoking status: Never Smoker   . Smokeless tobacco: Never Used  . Alcohol Use: No     Comment: occasional    Review of Systems  Constitutional: Negative for fever. Eyes: Negative for visual changes. ENT: Negative for neck pain Cardiovascular: Negative for chest pain. Respiratory: Breathing is at baseline Gastrointestinal: Negative for abdominal pain Genitourinary: Negative for dysuria. Musculoskeletal: Negative for back pain. Right shoulder pain and right  hip pain Skin: Negative for rash. Neurological: Negative for headaches or focal weakness Psychiatric: Positive for anxiety   ____________________________________________  PHYSICAL EXAM:  VITAL SIGNS: ED Triage Vitals  Enc Vitals Group     BP 2016/01/07 0116 140/89 mmHg     Pulse Rate 01-07-16 0116 105     Resp Jan 07, 2016 0116 18     Temp 01-07-16 0116 98.3 F (36.8 C)     Temp src --      SpO2 01-07-16 0116 96 %     Weight --      Height --      Head Cir --      Peak Flow --      Pain Score 2016/01/07 0138 10     Pain Loc --      Pain Edu? --      Excl. in [place]? --     Constitutional: Alert and oriented. In severe pain Eyes: Conjunctivae are normal.  ENT   Head: Normocephalic and atraumatic.   Mouth/Throat: Mucous membranes are moist. Cardiovascular: Tachycardia, regular rhythm. Normal and symmetric distal pulses are present in all extremities.  Respiratory: Normal respiratory effort without tachypnea nor retractions.  Bibasilar rales noted Gastrointestinal: Soft and non-tender in all quadrants. No distention. There is no CVA tenderness. Genitourinary: deferred Musculoskeletal: Pain with any range of motion of the right shoulder and right hip. No bony abnormalities felt. Extremity is warm and well perfused. Positive edema bilaterally  Neurologic:  Normal speech and language. No gross focal neurologic deficits are appreciated. Skin:  Skin is warm, dry and intact. Mild erythema to the right and left hip areas which appears chronic and not consistent with cellulitis  Psychiatric: Mood and affect are normal. Patient exhibits appropriate insight and judgment.  ____________________________________________    LABS (pertinent positives/negatives)  Labs Reviewed  CBC - Abnormal; Notable for the following:    WBC 1.4 (*)    RBC 3.48 (*)    Hemoglobin 8.2 (*)    HCT 26.6 (*)    MCV 76.6 (*)    MCH 23.5 (*)    MCHC 30.6 (*)    RDW 20.2 (*)    Platelets 104 (*)    All  other components within normal limits  COMPREHENSIVE METABOLIC PANEL - Abnormal; Notable for the following:    Potassium 5.5 (*)    Chloride 97 (*)    BUN 44 (*)    Creatinine, Ser 1.59 (*)    Calcium 7.5 (*)    Total Protein 5.4 (*)    Albumin 2.5 (*)    AST 61 (*)    Alkaline Phosphatase 225 (*)    GFR calc non Af Amer 34 (*)    GFR calc Af Amer 39 (*)    All other components within normal limits  TROPONIN I - Abnormal; Notable for the following:    Troponin I 0.04 (*)    All other components within normal limits  MAGNESIUM  BRAIN NATRIURETIC PEPTIDE  DIFFERENTIAL    ____________________________________________   EKG  ED ECG REPORT I, Lavonia Drafts, the attending physician, personally viewed and interpreted this ECG.   Date: 2016/01/07  EKG Time: 1:21 AM  Rate: 104  Rhythm: sinus tachycardia  Axis Unspecific Intervals:none  ST&[employer] Changenonspecific ____________________________________________    RADIOLOGY I have personally reviewed any xrays that were ordered on this patient:  chest x-ray unchanged from prior  Proximal humerus fracture Pelvic fracture?   ____________________________________________   PROCEDURES  Procedure(s) performed: none  Critical Care performed: yes  CRITICAL CARE Performed by: Lavonia Drafts   Total critical care time:30 minutes  Critical care time  was exclusive of separately billable procedures and treating other patients.  Critical care was necessary to treat or prevent imminent or life-threatening deterioration.  Critical care was time spent personally by me on the following activities: development of treatment plan with patient and/or surrogate as well as nursing, discussions with consultants, evaluation of patient's response to treatment, examination of patient, obtaining history from patient or surrogate, ordering and performing treatments and interventions, ordering and review of laboratory studies, ordering and review of  radiographic studies, pulse oximetry and re-evaluation of patient's condition.   ____________________________________________   INITIAL IMPRESSION / ASSESSMENT AND PLAN / ED COURSE  Pertinent labs & imaging results that were available during my care of the patient were reviewed by me and considered in my medical decision making (see chart for details).  given the amount of pain that the patient was then 1 mg of Dilaudid was given. Afterwards I was notified by the nurse that the patient was extremely drowsy and that her breathing seemed abnormal. Patient was satting well and heart rate was normal but indeed the patient was difficult to arouse and her breathing seemed labored. I ordered Narcan given multiple small doses to try to maintain pain control. Patient had a good response but unfortunately is in severe pain again. Will try Toradol IV/  Notified of elevated potassium and elevated troponin. Elevated potassium is especially concerning giving the patient is having runs of V. tach. I'll give IV calcium and Kayexalate  I will admit to the hospitalist service ----------------------------------------- 3:38 AM on 2016-01-15 -----------------------------------------   X-rays of pelvis and shoulder adjusted to portable as given runs V. tach unable to go to x-ray Proximal humerus fracture noted and concerning pelvic film as well, pending radiologist read. No fall noted by patient or family.   We will give small doses of morphine in an attempt to give the patient some relief  ----------------------------------------- 4:20 AM on Jan 15, 2016 -----------------------------------------  Patient with known pathologic pelvic fracture which is possibly worsened. New proximal humerus fracture, ortho consulted, sling placed   ____________________________________________   FINAL CLINICAL IMPRESSION(S) / ED DIAGNOSES  Final diagnoses:  Acute hyperkalemia  Ventricular tachycardia, non-sustained  (HCC)  Intractable pain   proximal humerus fracture   Lavonia Drafts, MD 01/15/16 [phone]

## 2016-01-19 NOTE — ED Notes (Addendum)
MD Marcille Blanco at bedside - MD notified of drop in pt's BP. MD Marcille Blanco ordered Bolus NaCl

## 2016-01-19 NOTE — ED Notes (Addendum)
Pt began to mumble incoherent sentences. Pt was asked if she knew her birth date. Pt responded correctly. Pt was asked if she knew where she was. Pt responded with her birth date. Pt continued to respond with her birth date to all questions. Dr Marcille Blanco present at bedside.

## 2016-01-19 NOTE — ED Notes (Signed)
Pt reports history of bone, lung and liver cancer. Pt reports history of stroke affecting left side - pt has weakness on left side resulting from stroke. Pt has reddness/warmth on bilateral hips, with increased redness on right hip. Pt has a hematoma lateral to implanted port - pt's son reports it occurred when the port was accessed last week.

## 2016-01-19 NOTE — Progress Notes (Signed)
Was called by ICU nurse for a code Blue on pt. Code was then called overhead. Pt was being bagged by RN upon arrival to the room. i assembled and set up for ED physician to intubate pt. Pt was intubated with visualization of cords and positive color change with ETCO2. 7.5 ETT was secured at 25 at the lip. Shortly thereafter pt was pronounced by the physician.

## 2016-01-19 NOTE — ED Notes (Signed)
Pt beginning to respond appropriately to verbal stimulus

## 2016-01-19 NOTE — ED Notes (Signed)
Patient crying out in pain. MD Kinner notified.

## 2016-01-19 NOTE — Progress Notes (Addendum)
[facility] MD called and since pt sats good and respirations okay, pt does not need abg. Informed MD the charge nurse asked for order based on responsiveness.

## 2016-01-19 NOTE — Progress Notes (Signed)
Dr. Marcille Blanco was called to inform that although the patient's sats were in the mid 90s and respirations in the low 20s, she was still struggling to breathe. MD put in order to place pt on bipap.  Pt blood pressure remained low, sugar was up to 80, but pt brady down to 40s and stopped breathing. Started code blue at [phone] and MD called code at [phone].

## 2016-01-19 NOTE — Progress Notes (Signed)
Pt arrived on floor with FBS of 58 so pushed D50. Called [facility] to report and requested abg.

## 2016-01-19 NOTE — Progress Notes (Signed)
[facility] Progress Note Patient Name: Renee Wells DOB: 21-Sep-1956 MRN: [id number]   Date of Service  January 10, 2016  HPI/Events of Note  22 F with metastatic lung CA with h/o of bone mets presenting to ED with 10/10 right hip pain.  In the ED the patient was given 1 mg Dilaudid but became less responsive and was given Narcan.  She was also found to have AKI with creat up from last hospital visit.  Also with some hyperkalemia.  Nurses are concerned about the patient's LOC.  Earlier the patient was alert and appropriate although drowsy.  The patient is HD stable with sats of 95% on RA and RR of 23.  Blood sugar was low.  eICU Interventions  Plan of care per primary team.  Pain control will be an issue due to altered LOC with additional pain medications.  With AKI and sensitive response to narcotics may need to consider medications such as Tramadol.  May need to consider IVFs with the addition of Dextrose due to her AKI and hypoglycemia.  Has received D50.  If GCS deteriorates consider additional studies.     Intervention Category Evaluation Type: New Patient Evaluation  Orphia Mctigue 01/10/2016, 5:39 AM

## 2016-01-19 NOTE — ED Notes (Signed)
Pt was questioned if she had fallen. Pt responded that she fell last Wednesday. Pt's son reported that he has been home with her constantly and she has not fallen. Pt's son reports pt has intermittent periods of dementia

## 2016-01-19 NOTE — ED Notes (Signed)
MD Marcille Blanco at bedside.

## 2016-01-19 NOTE — ED Notes (Signed)
Unable to obtain BP with sling placed on pt's arm. Removed sling from pt's arm

## 2016-01-19 NOTE — H&P (Signed)
Renee Wells is an 60 y.o. female.   Chief Complaint: Pain HPI: The patient with past medical history significant for metastatic breast cancer presents emergency department complaining of pain in her right arm and hip. She has been dealing with bone pain throughout her chemotherapy. In the emergency department x-ray revealed a fracture of the right humerus as well as what appears to be a crushing injury of her right acetabulum. The patient's son reports that she has not fallen. She was given Dilaudid IV in the emergency department which depressed her respiratory rate. She was then given Narcan and lower doses of IV narcotic to manage her pain. Telemetry subsequently revealed multiple nonsustained runs of intradural tachycardia. Laboratory evaluation revealed hyperkalemia. He was given calcium gluconate in the emergency department in the emergency department staff called for admission.  Past Medical History  Diagnosis Date  . Stroke [facility])     a. 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  . Malignant hypertension     a. Admitted 06/2015 and 11/2015;  b. 06/2015 nl aldosterone/renin activity;  c. Polypharmacy.  [name] HLD (hyperlipidemia)   . Morbid obesity ([place])   . Anasarca     a. 11/2015 Echo: EF 55-65%, triv TR/MR.  [name] Hypokalemia   . Type II diabetes mellitus ([place])     a. 11/2015 A1c 9.5.  . Metastatic cancer ([place])     a. 11/2015 CT abd/pelvis: large R hepatic lobe mass extending inf to pararenal space on R. Complete occlusion of R portal vein. L hepatic lobe lesion. Bilat LL pulm mets. Skeletal mets 2/ Fx of R iliac wing. Enhancing uterine fundal mass.  [name] History of CVA (cerebrovascular accident)   . Left-sided weakness   . IDA (iron deficiency anemia)   . Insomnia   . Cancer ([place])   . History of echocardiogram     a. 06/2015: EF: 60-65%; b. 11/2015: EF: 55-65%     Past Surgical History  Procedure Laterality Date  . Cesarean section    . Peripheral vascular catheterization N/A  12/10/2015    Procedure: Glori Luis Cath Insertion;  Surgeon: Katha Cabal, MD;  Location: [facility];  Service: Cardiovascular;  Laterality: N/A;    Family History  Problem Relation Age of Onset  . Heart disease Mother   . Hypertension Mother   . Diabetes Mother   . Diabetes Sister   . Hyperlipidemia Sister    Social History:  reports that she has never smoked. She has never used smokeless tobacco. She reports that she does not drink alcohol or use illicit drugs.  Allergies:  Allergies  Allergen Reactions  . Penicillins Shortness Of Breath and Other (See Comments)    Has patient had a PCN reaction causing immediate rash, facial/tongue/throat swelling, SOB or lightheadedness with hypotension: Yes Has patient had a PCN reaction causing severe rash involving mucus membranes or skin necrosis: No Has patient had a PCN reaction that required hospitalization No Has patient had a PCN reaction occurring within the last 10 years: No If all of the above answers are "NO", then may proceed with Cephalosporin use.    Medications Prior to Admission  Medication Sig Dispense Refill  . aspirin 81 MG chewable tablet Chew 1 tablet (81 mg total) by mouth daily.    [name] atorvastatin (LIPITOR) 20 MG tablet Take 20 mg by mouth at bedtime. Reported on 12/07/2015    . cloNIDine (CATAPRES) 0.3 MG tablet Take 1 tablet (0.3 mg total) by mouth 2 (two) times  daily. 60 tablet 0  . ferrous sulfate 325 (65 FE) MG tablet Take 1 tablet (325 mg total) by mouth 3 (three) times daily with meals. 90 tablet 3  . hydrALAZINE (APRESOLINE) 100 MG tablet Take 1 tablet (100 mg total) by mouth 4 (four) times daily. 120 tablet 2  . insulin aspart (NOVOLOG) 100 UNIT/ML injection Inject 16 Units into the skin 3 (three) times daily with meals. (Patient taking differently: Inject 24 Units into the skin 3 (three) times daily with meals. ) 10 mL 1  . insulin glargine (LANTUS) 100 UNIT/ML injection Inject 0.25 mLs (25 Units  total) into the skin at bedtime. (Patient taking differently: Inject 40 Units into the skin at bedtime. ) 10 mL 1  . isosorbide mononitrate (IMDUR) 60 MG 24 hr tablet Take 1 tablet (60 mg total) by mouth daily. 30 tablet 3  . labetalol (NORMODYNE) 300 MG tablet Take 1 tablet (300 mg total) by mouth 2 (two) times daily. 60 tablet 0  . magnesium oxide (MAG-OX) 400 (241.3 MG) MG tablet Take 1 tablet (400 mg total) by mouth daily. 30 tablet 0  . ondansetron (ZOFRAN) 8 MG tablet Take 1 tablet (8 mg total) by mouth every 8 (eight) hours as needed for nausea or vomiting. 90 tablet 0  . oxyCODONE-acetaminophen (ROXICET) 5-325 MG tablet Take 1 tablet by mouth every 4 (four) hours as needed for severe pain. 30 tablet 0  . potassium chloride SA (K-DUR,KLOR-CON) 20 MEQ tablet Take 2 tablets (40 mEq total) by mouth 2 (two) times daily. 60 tablet 0  . torsemide (DEMADEX) 20 MG tablet Take 1 tablet (20 mg total) by mouth 2 (two) times daily. 60 tablet 3    Results for orders placed or performed during the hospital encounter of 01/16/16 (from the past 48 hour(s))  TSH     Status: None   Collection Time: 12/31/15  4:00 AM  Result Value Ref Range   TSH 1.560 0.350 - 4.500 uIU/mL  Phosphorus     Status: None   Collection Time: 12/31/15  4:00 AM  Result Value Ref Range   Phosphorus 3.0 2.5 - 4.6 mg/dL  Brain natriuretic peptide     Status: Abnormal   Collection Time: 01/16/2016  1:35 AM  Result Value Ref Range   B Natriuretic Peptide 129.0 (H) 0.0 - 100.0 pg/mL  CBC     Status: Abnormal   Collection Time: 01-16-2016  1:46 AM  Result Value Ref Range   WBC 1.4 (LL) 3.6 - 11.0 K/uL    Comment: RESULT REPEATED AND VERIFIED CRITICAL RESULT CALLED TO, READ BACK BY AND VERIFIED WITH: JENNA ELLINGTON AT 0237 ON Jan 16, 2016 RWW    RBC 3.48 (L) 3.80 - 5.20 MIL/uL   Hemoglobin 8.2 (L) 12.0 - 16.0 g/dL   HCT 26.6 (L) 35.0 - 47.0 %   MCV 76.6 (L) 80.0 - 100.0 fL   MCH 23.5 (L) 26.0 - 34.0 pg   MCHC 30.6 (L) 32.0 - 36.0  g/dL   RDW 20.2 (H) 11.5 - 14.5 %   Platelets 104 (L) 150 - 440 K/uL  Comprehensive metabolic panel     Status: Abnormal   Collection Time: 01-16-2016  1:46 AM  Result Value Ref Range   Sodium 137 135 - 145 mmol/L   Potassium 5.5 (H) 3.5 - 5.1 mmol/L   Chloride 97 (L) 101 - 111 mmol/L   CO2 29 22 - 32 mmol/L   Glucose, Bld 77 65 - 99 mg/dL   BUN  44 (H) 6 - 20 mg/dL   Creatinine, Ser 1.59 (H) 0.44 - 1.00 mg/dL   Calcium 7.5 (L) 8.9 - 10.3 mg/dL   Total Protein 5.4 (L) 6.5 - 8.1 g/dL   Albumin 2.5 (L) 3.5 - 5.0 g/dL   AST 61 (H) 15 - 41 U/L   ALT 38 14 - 54 U/L   Alkaline Phosphatase 225 (H) 38 - 126 U/L   Total Bilirubin 1.0 0.3 - 1.2 mg/dL   GFR calc non Af Amer 34 (L) >60 mL/min   GFR calc Af Amer 39 (L) >60 mL/min    Comment: (NOTE) The eGFR has been calculated using the CKD EPI equation. This calculation has not been validated in all clinical situations. eGFR's persistently <60 mL/min signify possible Chronic Kidney Disease.    Anion gap 11 5 - 15  Troponin I     Status: Abnormal   Collection Time: 01-13-16  1:46 AM  Result Value Ref Range   Troponin I 0.04 (H) <0.031 ng/mL    Comment: READ BACK AND VERIFIED WITH JENNA ELLINGTON AT [id number]/17.PMH        PERSISTENTLY INCREASED TROPONIN VALUES IN THE RANGE OF 0.04-0.49 ng/mL CAN BE SEEN IN:       -UNSTABLE ANGINA       -CONGESTIVE HEART FAILURE       -MYOCARDITIS       -CHEST TRAUMA       -ARRYHTHMIAS       -LATE PRESENTING MYOCARDIAL INFARCTION       -COPD   CLINICAL FOLLOW-UP RECOMMENDED.   Magnesium     Status: None   Collection Time: 01-13-16  1:46 AM  Result Value Ref Range   Magnesium 1.9 1.7 - 2.4 mg/dL  Glucose, capillary     Status: Abnormal   Collection Time: January 13, 2016  5:15 AM  Result Value Ref Range   Glucose-Capillary 57 (L) 65 - 99 mg/dL  Glucose, capillary     Status: Abnormal   Collection Time: 2016/01/13  5:17 AM  Result Value Ref Range   Glucose-Capillary 58 (L) 65 - 99 mg/dL   Dg Pelvis  Portable  01-13-16  CLINICAL DATA:  Acute onset of right hip pain after fall. Initial encounter. EXAM: PORTABLE PELVIS 1-2 VIEWS COMPARISON:  CT of the abdomen and pelvis performed 11/27/2015 FINDINGS: There is increased lateral displacement of the patient's right iliac wing fragments, about the patient's known bony metastasis at the right iliac wing. This may reflect worsening displacement of a pathologic fracture, or expansion of the mass due to some degree of hemorrhage. Both femoral heads are seated normally within their respective acetabula. No significant degenerative change is appreciated. The sacroiliac joints are unremarkable in appearance. The visualized bowel gas pattern is grossly unremarkable in appearance. IMPRESSION: Increased lateral displacement of the right iliac wing fragments, about the patient's known bony metastasis at the right iliac wing. This may reflect worsening displacement of a pathologic fracture, or expansion of the mass due to some degree of hemorrhage. Electronically Signed   By: Garald Balding M.D.   On: 01/13/2016 03:58   Dg Chest Portable 1 View  January 13, 2016  CLINICAL DATA:  Shortness of breath. History of metastatic cancer. History of morbid obesity, hypertension, stroke, diabetes. EXAM: PORTABLE CHEST 1 VIEW COMPARISON:  Chest radiograph December 22, 2012 FINDINGS: Cardiac silhouette appears mildly enlarged. Increasing pulmonary vascular congestion with mild interstitial prominence. Nodular densities throughout the lungs compatible with metastasis. Bibasilar strandy densities with persistently elevated RIGHT hemidiaphragm.  No pneumothorax. Single lumen RIGHT chest Port-A-Cath with distal tip projecting in distal superior vena cava. No pneumothorax. Soft tissue planes and included osseous structure nonsuspicious. IMPRESSION: Stable cardiomegaly and pulmonary vascular congestion/ edema. Multiple pulmonary metastasis. Electronically Signed   By: Elon Alas M.D.   On:  01/26/16 02:57   Dg Shoulder Right Port  January 26, 2016  CLINICAL DATA:  Status post fall 2 days ago. Altered mental status. Concern for right shoulder injury. Initial encounter. EXAM: PORTABLE RIGHT SHOULDER - 2+ VIEW COMPARISON:  Chest radiograph performed earlier today at 20:05 a.m. FINDINGS: There is a mildly comminuted fracture of the right humeral neck, with mild medial displacement. There is inferior subluxation of the right humeral head, likely reflecting a glenohumeral joint effusion. The right acromioclavicular joint is grossly unremarkable. There is no evidence of dislocation. Overlying soft tissue swelling is noted. Atelectasis is noted within the visualized portions of the lungs, likely transient in nature. A right-sided chest port is noted. IMPRESSION: Mildly comminuted fracture of the right humeral neck, with mild medial displacement. Likely underlying glenohumeral joint effusion noted. Electronically Signed   By: Garald Balding M.D.   On: 01-26-2016 03:52    Review of Systems  Unable to perform ROS: acuity of condition  Constitutional: Positive for chills. Negative for fever.  Musculoskeletal: Positive for joint pain.    Blood pressure 102/56, pulse 111, temperature 98.3 F (36.8 C), resp. rate 34, SpO2 93 %. Physical Exam  Nursing note and vitals reviewed. Constitutional: She is oriented to person, place, and time. She appears well-developed and well-nourished. She appears distressed.  HENT:  Head: Normocephalic and atraumatic.  Mouth/Throat: Mucous membranes are dry. No oropharyngeal exudate.  Eyes: Conjunctivae and EOM are normal. Pupils are equal, round, and reactive to light. No scleral icterus.  Neck: Normal range of motion. Neck supple. No JVD present. No tracheal deviation present. No thyromegaly present.  Cardiovascular: Normal rate, regular rhythm and normal heart sounds.  Exam reveals no gallop and no friction rub.   No murmur heard. Rhythm strips show multiple  episodes of ventricular tachycardia  Respiratory: Effort normal and breath sounds normal.  Port in place right chest sounded by significant ecchymosis  GI: Soft. Bowel sounds are normal. She exhibits no distension. There is no tenderness.  Genitourinary:  Deferred  Musculoskeletal: Normal range of motion. She exhibits edema and tenderness (Right upper arm as well as right hip).  Lymphadenopathy:    She has no cervical adenopathy.  Neurological: She is alert and oriented to person, place, and time. No cranial nerve deficit. She exhibits normal muscle tone.  Skin: Skin is warm and dry. She is not diaphoretic.  Psychiatric:  Patient is delirious     Assessment/Plan This is a 60 year old African American female with metastatic breast cancer who is admitted for ventricular arrhythmias secondary to electrolyte imbalances and bone pain. 1. Ventricular arrhythmias: The patient is tachycardic and occasionally has episodes of ventricular tachycardia. Replete electrolytes and shift potassium. Monitor telemetry. 2. Hyperkalemia: I shift to the patient's potassium with insulin, albuterol and IV Lasix. She is not taking oral medications well at this time so I will defer Kayexalate. She is already received calcium gluconate have ordered magnesium as well. Continue to monitor telemetry 3. Bone pain: Secondary to metastases. Onset of Dilaudid I have ordered morphine every 3 hours as needed for severe pain. 4. Morbid obesity: We will obtain the patient's BMI; encourage healthy diet. 5. Diabetes mellitus type II: Continue basal insulin albeit significantly reduced as  the patient is not eating well. Sliding scale insulin while the patient is hospitalized. 6. Hypertension: This is been initiated a past presumably secondary to chemotherapy. At this time the patient's blood pressure is low and we aggressively hydrated with intravenous fluid 7. Neutropenia: We'll obtain a differential on the CBC; neutropenic  precautions 8. Sepsis: The patient is criteria via heart rate and respiratory rate although she has tremendous metastatic pain which can also mirror symptoms of sepsis. If the patient develops hyper or hypokalemia initiated septic protocol and start broad-spectrum antibiotics. 9. DVT prophylaxis: Heparin 10. GI prophylaxis: None The patient is currently a full code. The family has been updated on the severity of her illness and will consider revising her CODE STATUS. Time spent on admission orders and critical patient care approximately 45 minutes  By the time of this dictation the patient suffered a respiratory arrest. CPR was initiated and multiple rounds of epinephrine as well as other drugs were used in an attempt to restore circulation the patient was shocked once before a rhythm consistent with ventricular fibrillation but did not regain a pulse. After 15 minutes without a pulse we stopped resuscitation efforts and pronounced death.  Harrie Foreman 01-28-2016, 5:59 AM

## 2016-01-19 NOTE — ED Notes (Signed)
Patient holding breath after each inspiration.  Pt unresponsive to communication. Pt moaning - no verbal response to verbal stimulus or touch. MD Kinner notified.

## 2016-01-19 NOTE — ED Notes (Signed)
Mali RN asssessed pt's port. Port flushes and pulls successfully.

## 2016-01-19 NOTE — ED Notes (Signed)
Pt reports right shoulder and right sided pain chronic but worsening day

## 2016-01-19 DEATH — deceased

## 2016-01-31 IMAGING — CT CT CHEST W/ CM
1 of 2 series · 15 of 32 positions shown, 19 images · IV contrast (omnipaque)
Comparison: 11/27/2015 abdominal CT.  Chest radiograph 11/26/2015.

CLINICAL DATA: Recently diagnosed with liver metastasis, poorly
differentiated with neuroendocrine features.

EXAM:
CT CHEST WITH CONTRAST
TECHNIQUE: Multidetector CT imaging of the chest was performed during
intravenous contrast administration.
CONTRAST:  75mL OMNIPAQUE IOHEXOL 300 MG/ML  SOLN

[Series 2: routine chest with · axial · 0.69mm/px · z∈[-280,-50]mm · 15 of 54 slices shown, 19 images]
[im 4/54  mediastinal]
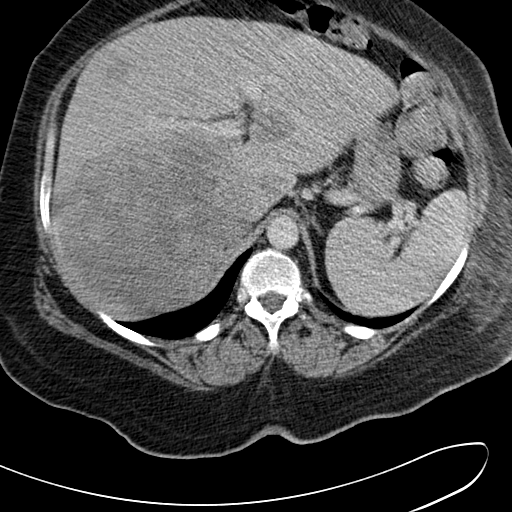
[im 4/54  lung]
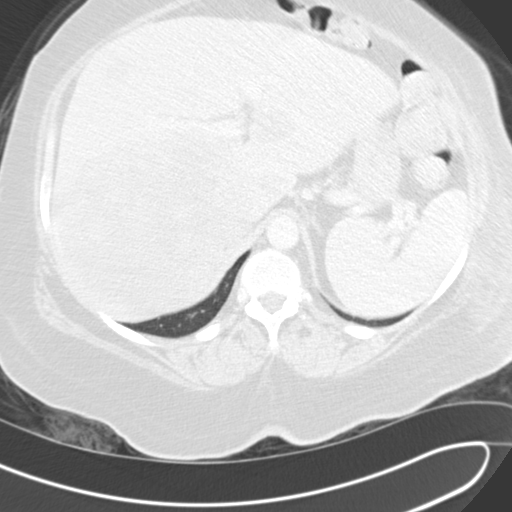
[im 7/54  lung]
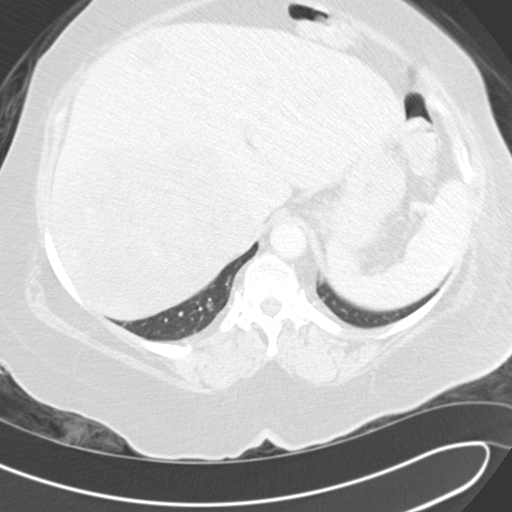
[im 14/54  lung]
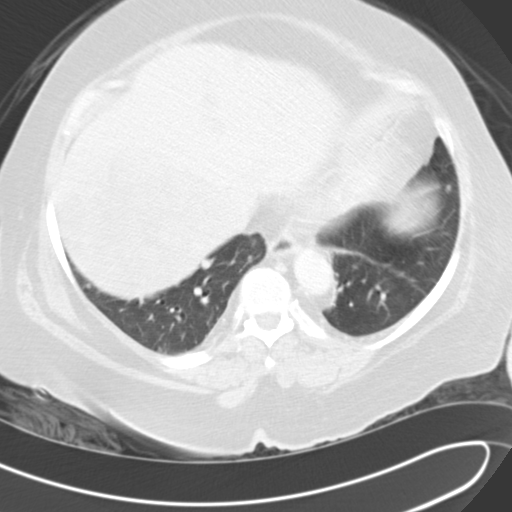
[im 17/54  lung]
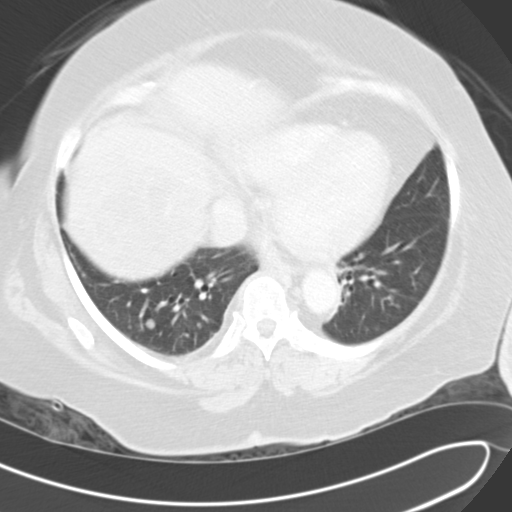
[im 18/54  mediastinal]
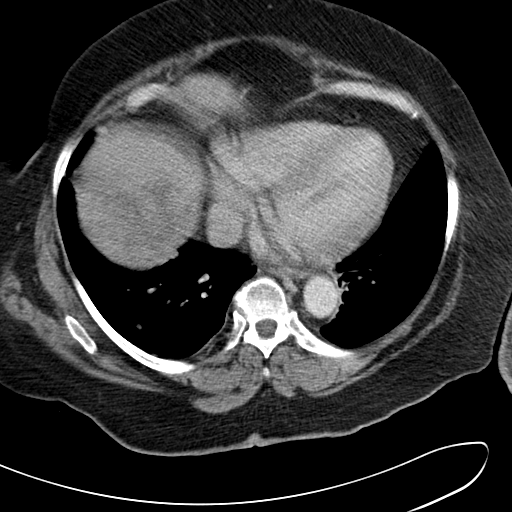
[im 18/54  lung]
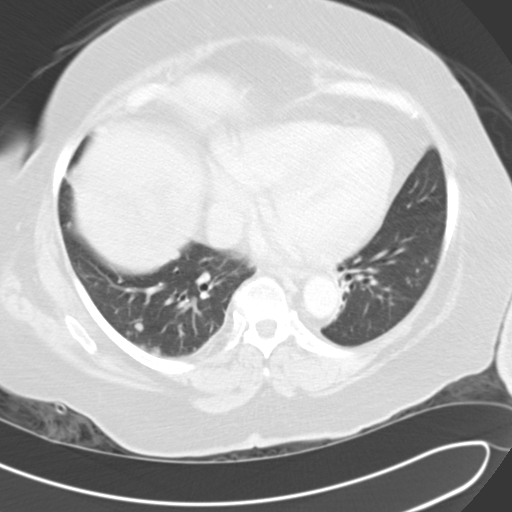
[im 20/54  lung]
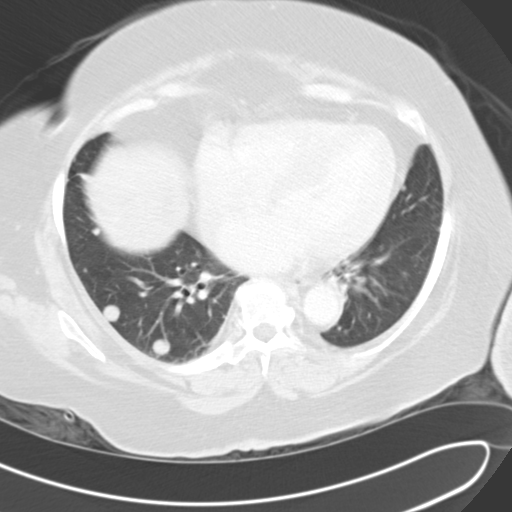
[im 21/54  lung]
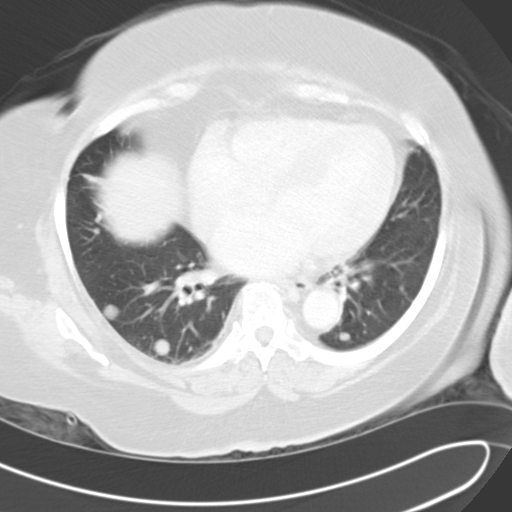
[im 27/54  lung]
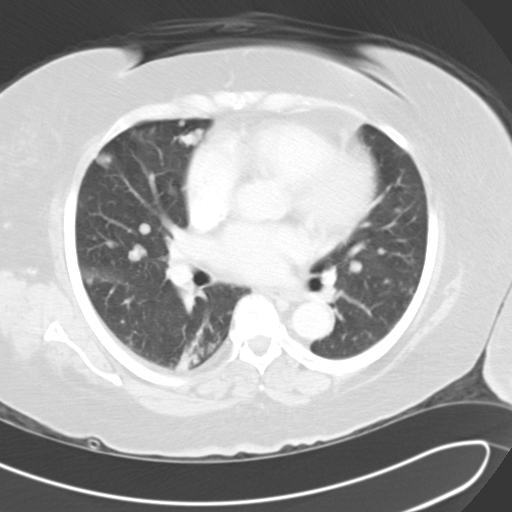
[im 30/54  mediastinal]
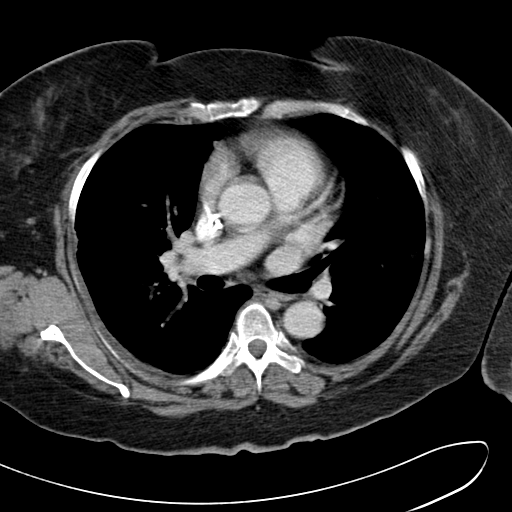
[im 30/54  lung]
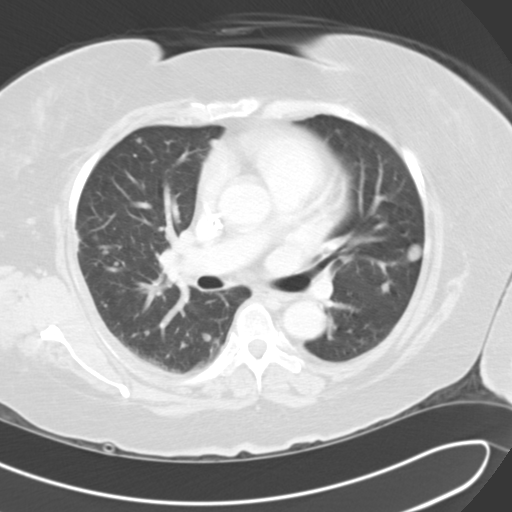
[im 34/54  lung]
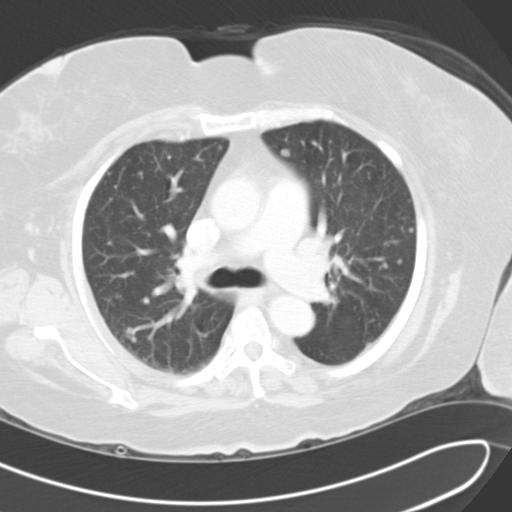
[im 36/54  lung]
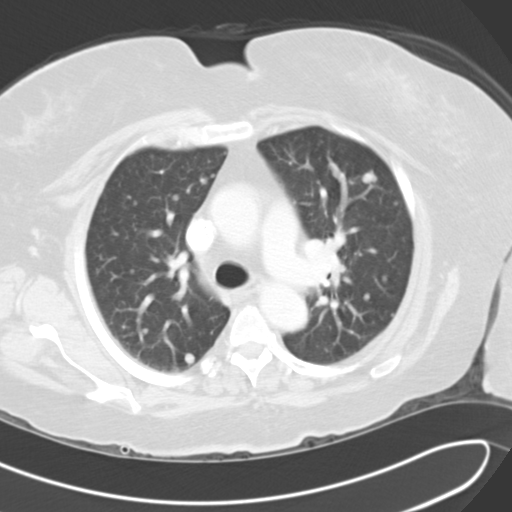
[im 37/54  lung]
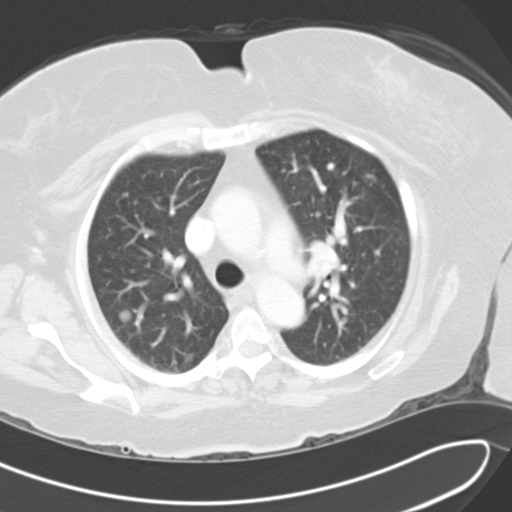
[im 44/54  mediastinal]
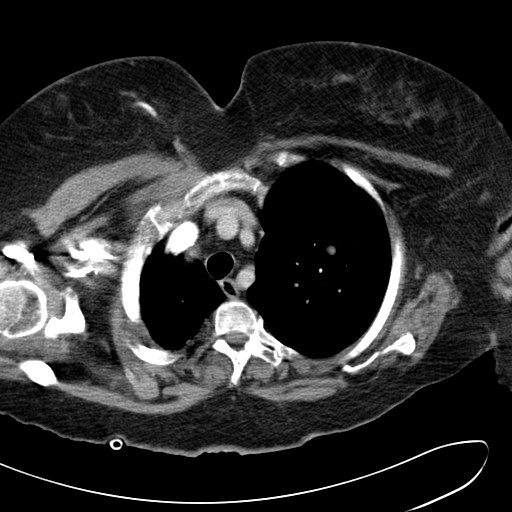
[im 44/54  lung]
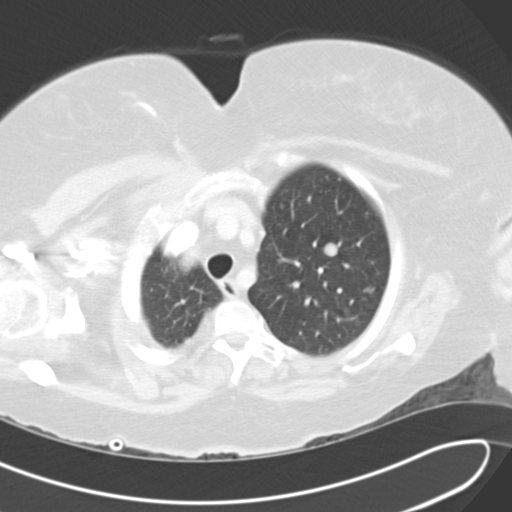
[im 47/54  lung]
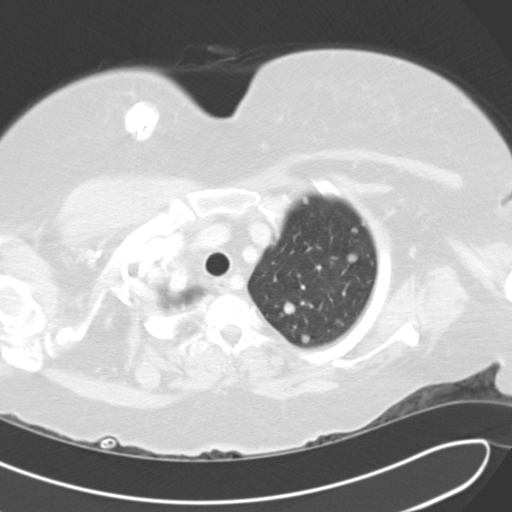
[im 50/54  lung]
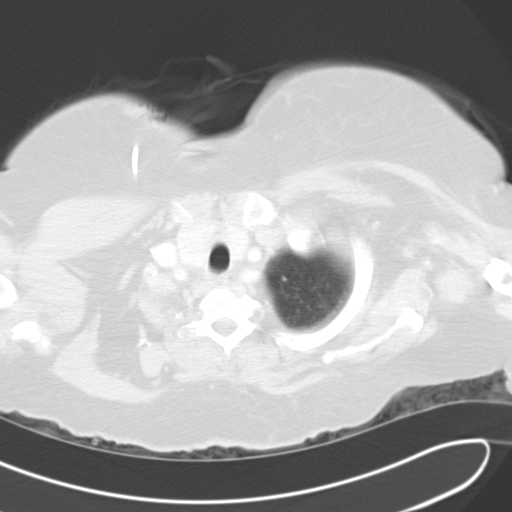

[15 of 32 positions shown; findings below may reference images not displayed]

FINDINGS: Mediastinum/Nodes: A right-sided Port-A-Cath which terminates at the
high right atrium. No supraclavicular adenopathy. Tortuous thoracic
aorta. Moderate cardiomegaly, without pericardial effusion. No
central pulmonary embolism, on this non-dedicated study. No
mediastinal or hilar adenopathy.

Lungs/Pleura: No pleural fluid. Minimal motion degradation.
Innumerable bilateral pulmonary nodules, consistent with metastatic
disease. Index right upper lobe nodule measures 1.0 cm on image
17/series 3.

Index right lower lobe nodule measures 11 mm on image 35/series 3.

Index left lower lobe pulmonary nodule measures 7 mm on image
34/series 3.

Index left upper lobe nodule measures 12 mm on image 26/series 3.

Upper abdomen: Bilateral hepatic metastasis, as detailed previously.
Index lateral segment left liver lobe lesion measures 3.4 cm on
image 46/series 2. Increased from 2.3 cm on the prior. Normal imaged
portions of the spleen, pancreas, left adrenal gland, and left
kidney.

Musculoskeletal: No acute osseous abnormality.
IMPRESSION: 1. Extensive pulmonary metastasis.
2. Bilateral liver masses/metastasis. Index left liver lobe lesion
has enlarged significantly since 11/27/2015.

## 2016-02-01 IMAGING — CR DG CHEST 1V PORT
1 series · 1 of 1 positions shown · non-contrast
Comparison: Chest CT dated 12/14/2015 and chest x-ray dated
11/26/2015

CLINICAL DATA: Metastatic neuroendocrine tumor.

EXAM:
PORTABLE CHEST 1 VIEW

[ap]
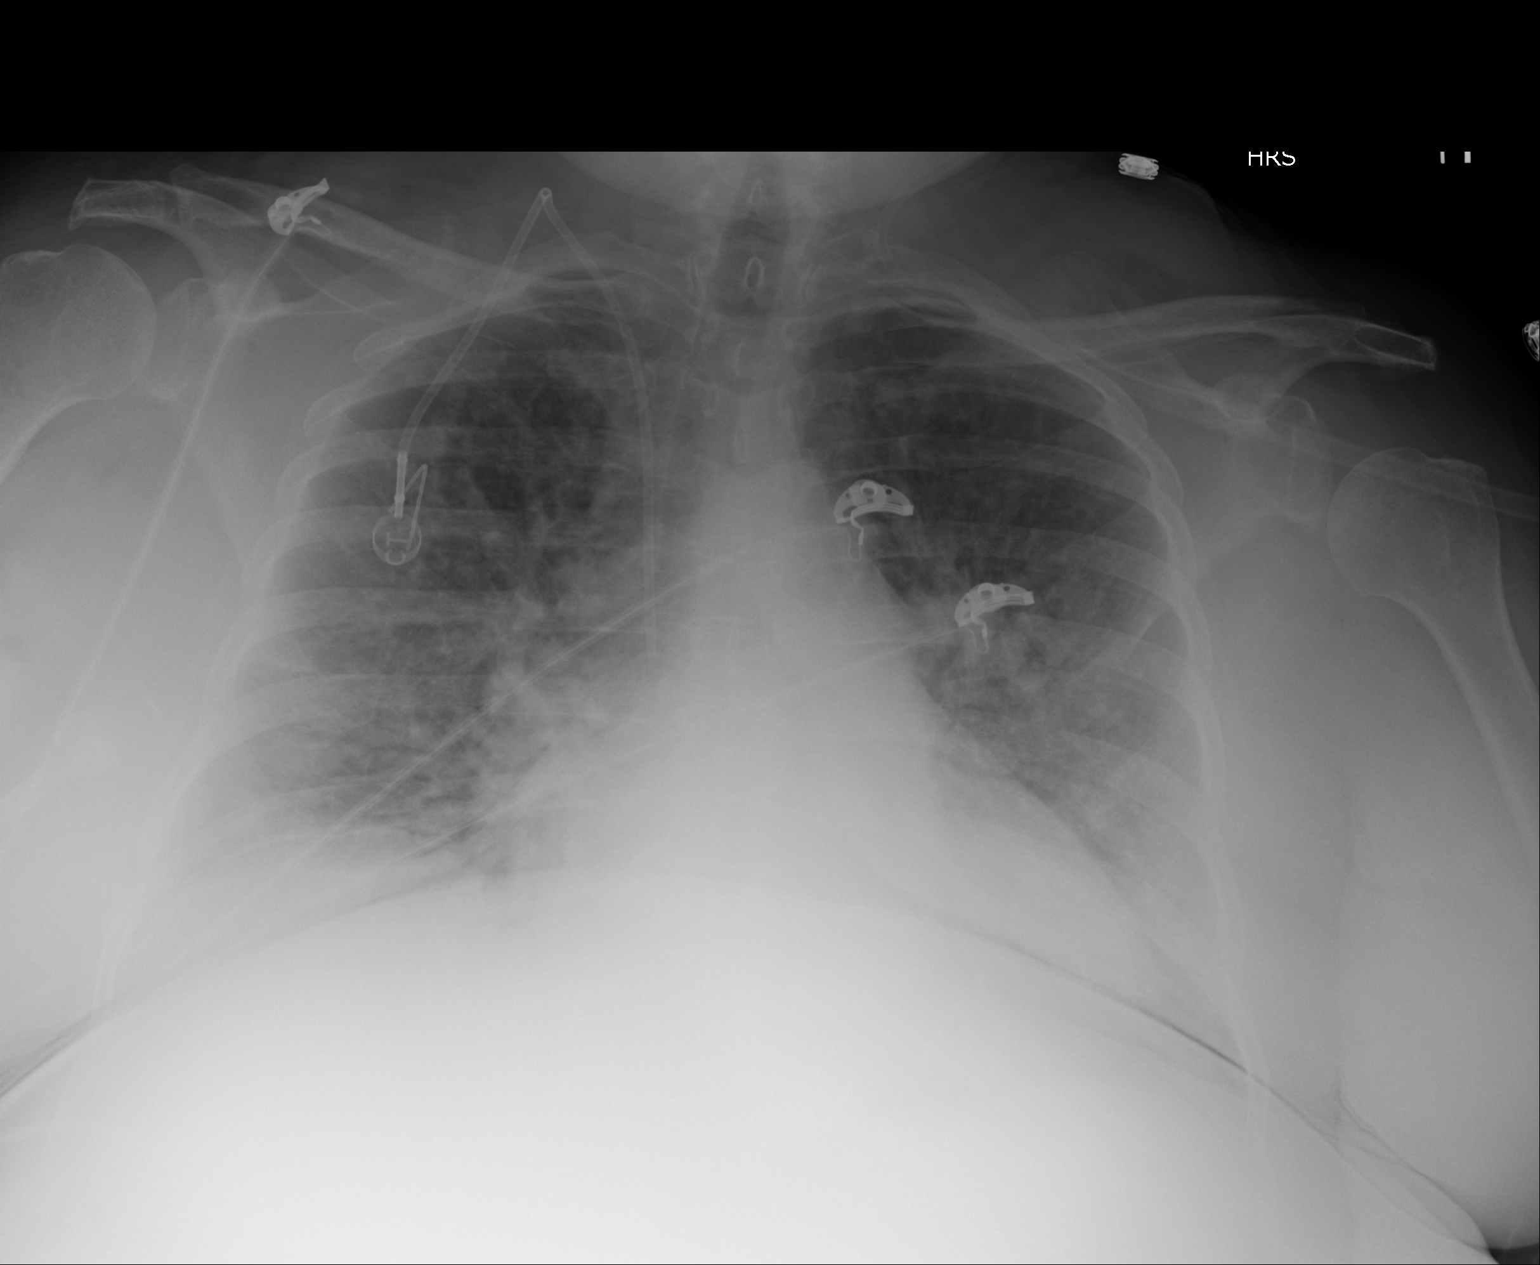

[1 of 1 positions shown; findings below may reference images not displayed]

FINDINGS: Power port in place. Multiple pulmonary metastases demonstrate
significant progression since the prior chest x-ray. There is new
pulmonary vascular congestion with slight atelectasis at the lung
bases, possibly due to a shallow inspiration.
IMPRESSION: New pulmonary vascular congestion since the prior CT scan and chest
x-ray. Extensive pulmonary metastases have appreciably progressed
since 11/26/2015.

## 2016-02-18 IMAGING — CR DG SHOULDER 2+V PORT*R*
1 series · 2 of 2 positions shown · non-contrast
Comparison: Chest radiograph performed earlier today at [DATE] a.m.

CLINICAL DATA: Status post fall 2 days ago. Altered mental status.
Concern for right shoulder injury. Initial encounter.

EXAM:
PORTABLE RIGHT SHOULDER - 2+ VIEW

[Series 1: ap · 0.17mm/px · 2 of 2 slices shown]
[im 1/2]
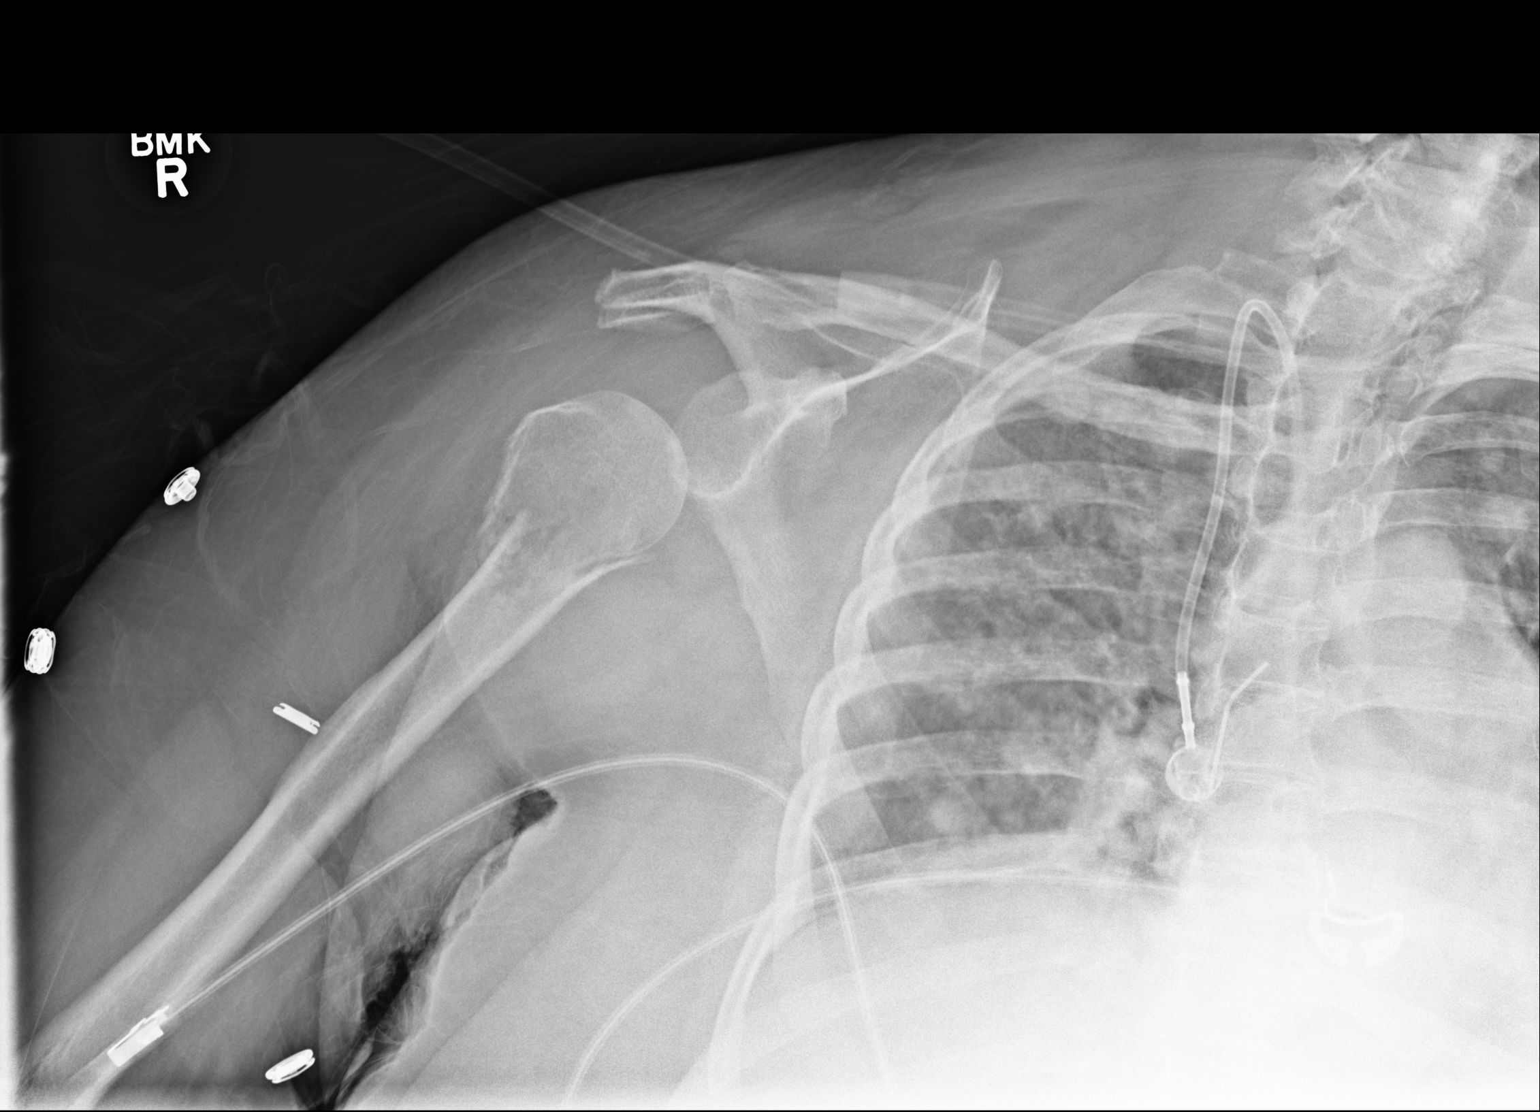
[im 2/2]
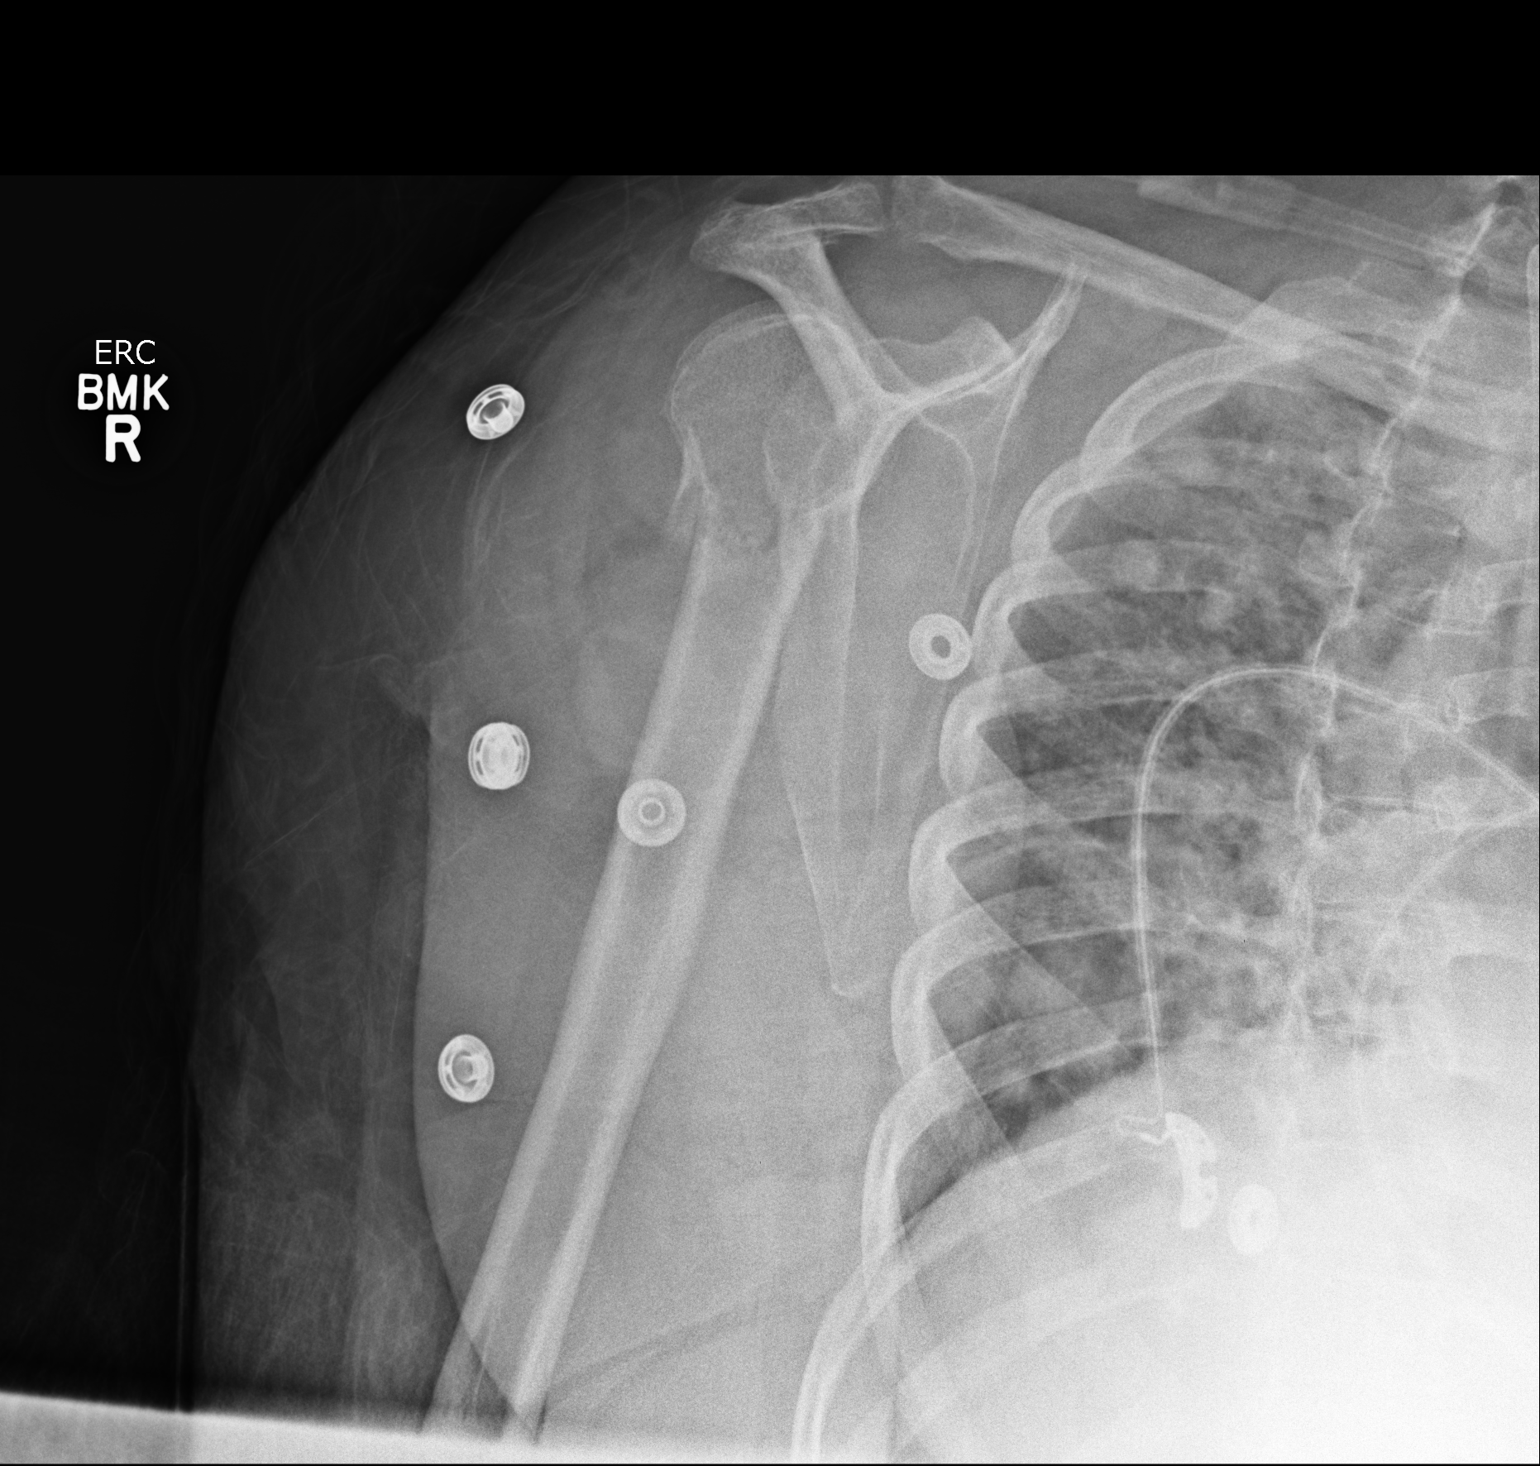

[2 of 2 positions shown; findings below may reference images not displayed]

FINDINGS: There is a mildly comminuted fracture of the right humeral neck,
with mild medial displacement.

There is inferior subluxation of the right humeral head, likely
reflecting a glenohumeral joint effusion. The right
acromioclavicular joint is grossly unremarkable. There is no
evidence of dislocation. Overlying soft tissue swelling is noted.

Atelectasis is noted within the visualized portions of the lungs,
likely transient in nature. A right-sided chest port is noted.
IMPRESSION: Mildly comminuted fracture of the right humeral neck, with mild
medial displacement. Likely underlying glenohumeral joint effusion
noted.

## 2016-02-18 IMAGING — CR DG CHEST 1V PORT
1 series · 1 of 1 positions shown · non-contrast
Comparison: Chest radiograph December 22, 2012

CLINICAL DATA: Shortness of breath. History of metastatic cancer.
History of morbid obesity, hypertension, stroke, diabetes.

EXAM:
PORTABLE CHEST 1 VIEW

[portable]
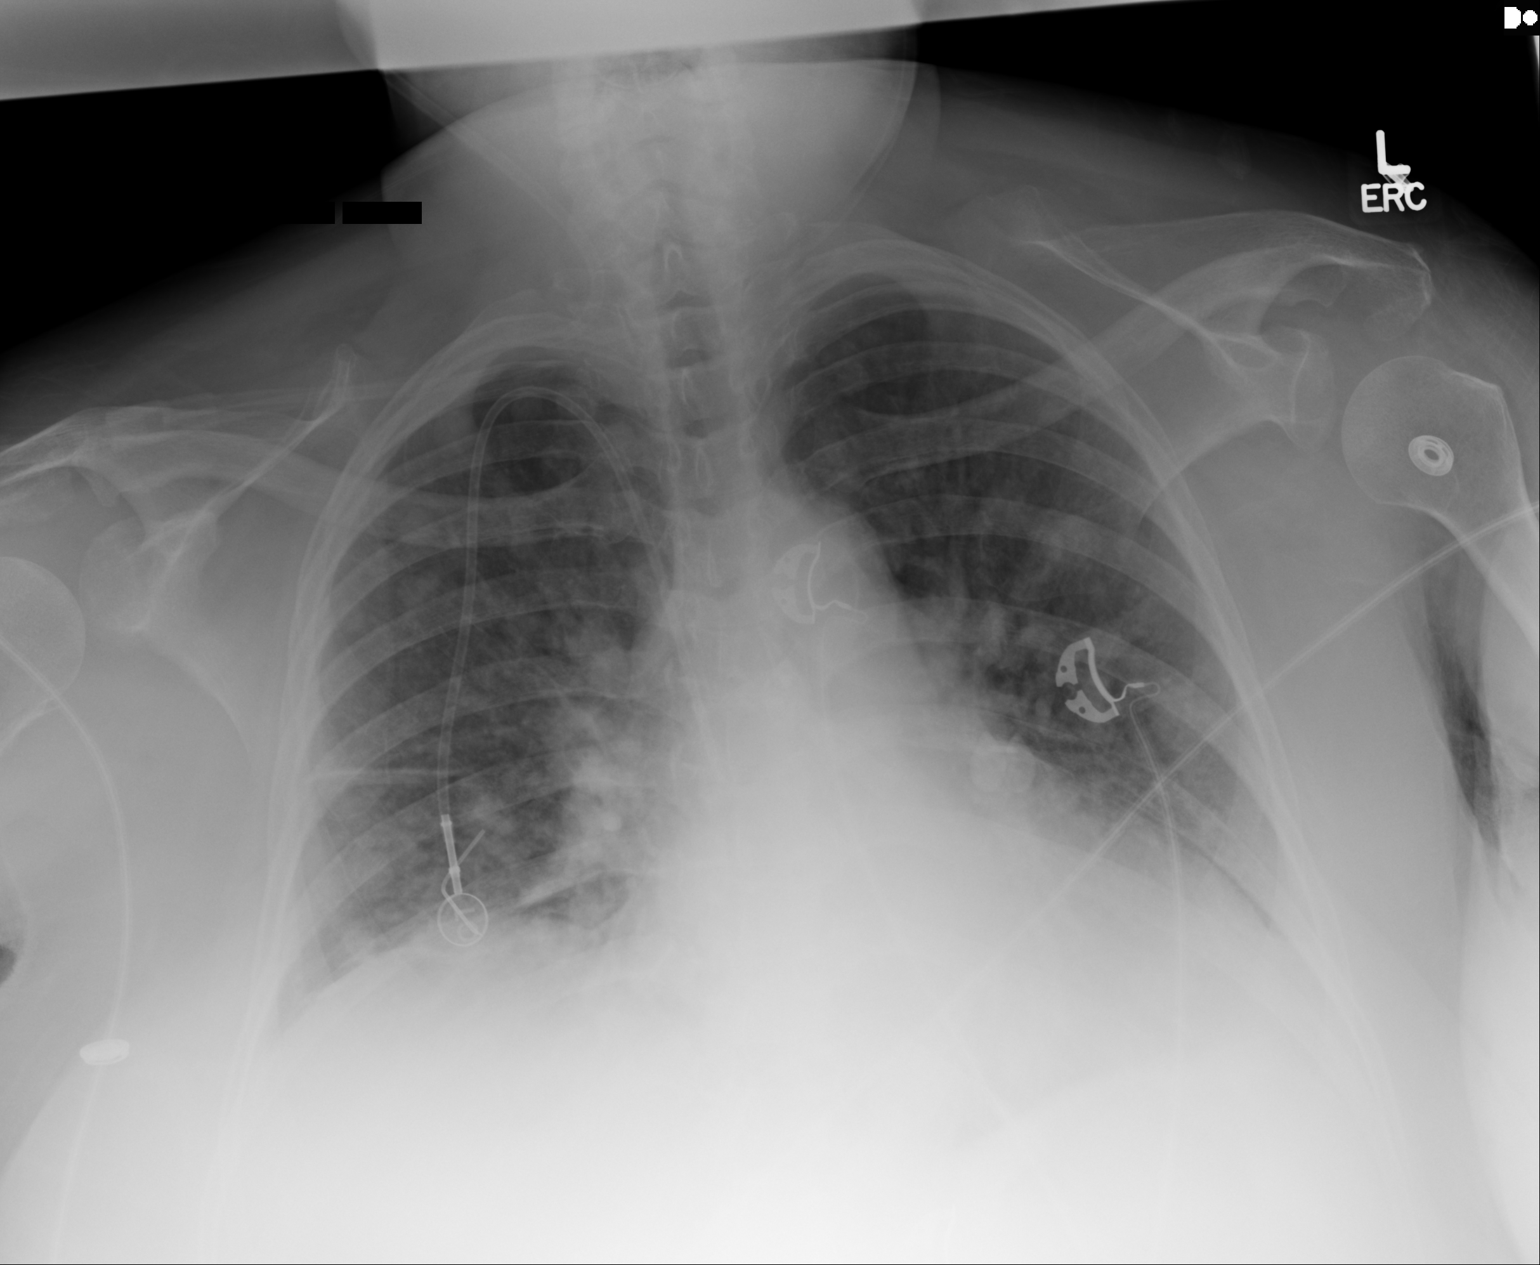

[1 of 1 positions shown; findings below may reference images not displayed]

FINDINGS: Cardiac silhouette appears mildly enlarged. Increasing pulmonary
vascular congestion with mild interstitial prominence. Nodular
densities throughout the lungs compatible with metastasis. Bibasilar
strandy densities with persistently elevated RIGHT hemidiaphragm. No
pneumothorax. Single lumen RIGHT chest Port-A-Cath with distal tip
projecting in distal superior vena cava. No pneumothorax. Soft
tissue planes and included osseous structure nonsuspicious.
IMPRESSION: Stable cardiomegaly and pulmonary vascular congestion/ edema.

Multiple pulmonary metastasis.

## 2016-05-27 ENCOUNTER — Other Ambulatory Visit: Payer: Self-pay | Admitting: Nurse Practitioner
